# Patient Record
Sex: Male | Born: 1935 | Race: White | Hispanic: No | Marital: Married | Smoking: Former smoker
Health system: Southern US, Community
[De-identification: ages and names within clinical notes are randomized; demographics above are authoritative.]

## PROBLEM LIST (undated history)

## (undated) DIAGNOSIS — R740 Nonspecific elevation of levels of transaminase and lactic acid dehydrogenase [LDH]: Secondary | ICD-10-CM

## (undated) DIAGNOSIS — K8033 Calculus of bile duct with acute cholangitis with obstruction: Secondary | ICD-10-CM

## (undated) DIAGNOSIS — J189 Pneumonia, unspecified organism: Secondary | ICD-10-CM

## (undated) DIAGNOSIS — K315 Obstruction of duodenum: Secondary | ICD-10-CM

## (undated) DIAGNOSIS — K279 Peptic ulcer, site unspecified, unspecified as acute or chronic, without hemorrhage or perforation: Secondary | ICD-10-CM

## (undated) DIAGNOSIS — K838 Other specified diseases of biliary tract: Secondary | ICD-10-CM

## (undated) DIAGNOSIS — R17 Unspecified jaundice: Secondary | ICD-10-CM

## (undated) DIAGNOSIS — R911 Solitary pulmonary nodule: Secondary | ICD-10-CM

## (undated) DIAGNOSIS — G629 Polyneuropathy, unspecified: Secondary | ICD-10-CM

## (undated) DIAGNOSIS — K8071 Calculus of gallbladder and bile duct without cholecystitis with obstruction: Secondary | ICD-10-CM

## (undated) DIAGNOSIS — K8050 Calculus of bile duct without cholangitis or cholecystitis without obstruction: Secondary | ICD-10-CM

## (undated) DIAGNOSIS — K8020 Calculus of gallbladder without cholecystitis without obstruction: Secondary | ICD-10-CM

## (undated) DIAGNOSIS — E43 Unspecified severe protein-calorie malnutrition: Secondary | ICD-10-CM

## (undated) DIAGNOSIS — R509 Fever, unspecified: Secondary | ICD-10-CM

## (undated) DIAGNOSIS — M1990 Unspecified osteoarthritis, unspecified site: Secondary | ICD-10-CM

## (undated) DIAGNOSIS — J45909 Unspecified asthma, uncomplicated: Secondary | ICD-10-CM

## (undated) HISTORY — DX: Unspecified severe protein-calorie malnutrition: E43

## (undated) HISTORY — DX: Calculus of gallbladder without cholecystitis without obstruction: K80.20

## (undated) HISTORY — DX: Fever, unspecified: R50.9

## (undated) HISTORY — DX: Obstruction of duodenum: K31.5

## (undated) HISTORY — DX: Calculus of bile duct with acute cholangitis with obstruction: K80.33

## (undated) HISTORY — DX: Unspecified jaundice: R17

## (undated) HISTORY — DX: Calculus of bile duct without cholangitis or cholecystitis without obstruction: K80.50

## (undated) HISTORY — DX: Other specified diseases of biliary tract: K83.8

## (undated) HISTORY — DX: Calculus of gallbladder and bile duct without cholecystitis with obstruction: K80.71

## (undated) HISTORY — DX: Nonspecific elevation of levels of transaminase and lactic acid dehydrogenase (ldh): R74.0

## (undated) HISTORY — DX: Pneumonia, unspecified organism: J18.9

## (undated) HISTORY — PX: CERVICAL SPINE SURGERY: SHX589

---

## 2015-09-25 ENCOUNTER — Encounter: Payer: Self-pay | Admitting: *Deleted

## 2015-09-25 ENCOUNTER — Emergency Department: Payer: Medicare Other

## 2015-09-25 ENCOUNTER — Inpatient Hospital Stay
Admission: EM | Admit: 2015-09-25 | Discharge: 2015-09-26 | DRG: 193 | Disposition: A | Discharge status: Home / Self Care | Payer: Medicare Other | Attending: Internal Medicine | Admitting: Internal Medicine

## 2015-09-25 DIAGNOSIS — Z87891 Personal history of nicotine dependence: Secondary | ICD-10-CM

## 2015-09-25 DIAGNOSIS — G8929 Other chronic pain: Secondary | ICD-10-CM | POA: Diagnosis present

## 2015-09-25 DIAGNOSIS — M1990 Unspecified osteoarthritis, unspecified site: Secondary | ICD-10-CM | POA: Diagnosis present

## 2015-09-25 DIAGNOSIS — Z6820 Body mass index (BMI) 20.0-20.9, adult: Secondary | ICD-10-CM

## 2015-09-25 DIAGNOSIS — G629 Polyneuropathy, unspecified: Secondary | ICD-10-CM | POA: Diagnosis present

## 2015-09-25 DIAGNOSIS — J45909 Unspecified asthma, uncomplicated: Secondary | ICD-10-CM | POA: Diagnosis present

## 2015-09-25 DIAGNOSIS — E43 Unspecified severe protein-calorie malnutrition: Secondary | ICD-10-CM | POA: Diagnosis present

## 2015-09-25 DIAGNOSIS — Z8711 Personal history of peptic ulcer disease: Secondary | ICD-10-CM

## 2015-09-25 DIAGNOSIS — J189 Pneumonia, unspecified organism: Principal | ICD-10-CM | POA: Diagnosis present

## 2015-09-25 DIAGNOSIS — Z8261 Family history of arthritis: Secondary | ICD-10-CM

## 2015-09-25 DIAGNOSIS — I959 Hypotension, unspecified: Secondary | ICD-10-CM | POA: Diagnosis present

## 2015-09-25 DIAGNOSIS — Z9889 Other specified postprocedural states: Secondary | ICD-10-CM

## 2015-09-25 HISTORY — DX: Peptic ulcer, site unspecified, unspecified as acute or chronic, without hemorrhage or perforation: K27.9

## 2015-09-25 HISTORY — DX: Unspecified osteoarthritis, unspecified site: M19.90

## 2015-09-25 HISTORY — DX: Polyneuropathy, unspecified: G62.9

## 2015-09-25 HISTORY — DX: Solitary pulmonary nodule: R91.1

## 2015-09-25 HISTORY — DX: Unspecified asthma, uncomplicated: J45.909

## 2015-09-25 LAB — CBC WITH DIFFERENTIAL/PLATELET
BASOS PCT: 0 %
Basophils Absolute: 0 10*3/uL (ref 0–0.1)
Eosinophils Absolute: 0.1 10*3/uL (ref 0–0.7)
Eosinophils Relative: 1 %
HEMATOCRIT: 35.9 % — AB (ref 40.0–52.0)
HEMOGLOBIN: 11.6 g/dL — AB (ref 13.0–18.0)
LYMPHS ABS: 0.3 10*3/uL — AB (ref 1.0–3.6)
LYMPHS PCT: 2 %
MCH: 28.4 pg (ref 26.0–34.0)
MCHC: 32.4 g/dL (ref 32.0–36.0)
MCV: 87.6 fL (ref 80.0–100.0)
MONO ABS: 0.9 10*3/uL (ref 0.2–1.0)
MONOS PCT: 7 %
NEUTROS ABS: 12 10*3/uL — AB (ref 1.4–6.5)
NEUTROS PCT: 90 %
Platelets: 736 10*3/uL — ABNORMAL HIGH (ref 150–440)
RBC: 4.1 MIL/uL — ABNORMAL LOW (ref 4.40–5.90)
RDW: 17 % — AB (ref 11.5–14.5)
WBC: 13.3 10*3/uL — ABNORMAL HIGH (ref 3.8–10.6)

## 2015-09-25 LAB — COMPREHENSIVE METABOLIC PANEL
ALBUMIN: 3.8 g/dL (ref 3.5–5.0)
ALK PHOS: 66 U/L (ref 38–126)
ALT: 14 U/L — ABNORMAL LOW (ref 17–63)
ANION GAP: 6 (ref 5–15)
AST: 25 U/L (ref 15–41)
BUN: 20 mg/dL (ref 6–20)
CALCIUM: 8.7 mg/dL — AB (ref 8.9–10.3)
CHLORIDE: 103 mmol/L (ref 101–111)
CO2: 30 mmol/L (ref 22–32)
Creatinine, Ser: 0.88 mg/dL (ref 0.61–1.24)
GFR calc Af Amer: >60 mL/min (ref >60)
GFR calc non Af Amer: >60 mL/min (ref >60)
GLUCOSE: 141 mg/dL — AB (ref 65–99)
Potassium: 3.9 mmol/L (ref 3.5–5.1)
SODIUM: 139 mmol/L (ref 135–145)
Total Bilirubin: 0.5 mg/dL (ref 0.3–1.2)
Total Protein: 6.9 g/dL (ref 6.5–8.1)

## 2015-09-25 LAB — RAPID INFLUENZA A&B ANTIGENS
Influenza A (ARMC): NEGATIVE
Influenza B (ARMC): NEGATIVE

## 2015-09-25 MED ORDER — DEXTROSE 5 % IV SOLN
500.0000 mg | Freq: Once | INTRAVENOUS | Status: AC
  Administered 2015-09-25: 500 mg via INTRAVENOUS
  Filled 2015-09-25: qty 500

## 2015-09-25 MED ORDER — ACETAMINOPHEN 325 MG PO TABS
650.0000 mg | ORAL_TABLET | Freq: Once | ORAL | Status: AC
  Administered 2015-09-25: 650 mg via ORAL
  Filled 2015-09-25: qty 2

## 2015-09-25 MED ORDER — DEXTROSE 5 % IV SOLN
1.0000 g | Freq: Once | INTRAVENOUS | Status: AC
  Administered 2015-09-25: 1 g via INTRAVENOUS
  Filled 2015-09-25: qty 10

## 2015-09-25 NOTE — ED Notes (Addendum)
Pt arrived to ED via EMS from home with new onset weakness at the dinner table this evening. Pt with cough and congestion x 4 days. Pt with low grade fever at this time, AAOx3, NAD noted. Pt with congested cough noted, productive.

## 2015-09-25 NOTE — ED Notes (Signed)
Pt ambulated to toilet with 1 assist.

## 2015-09-25 NOTE — ED Notes (Signed)
Pt returned from xray via stretcher.

## 2015-09-25 NOTE — ED Notes (Signed)
Pt transported to xray via stretcher

## 2015-09-25 NOTE — ED Notes (Signed)
Per family members, pt has been stating he is tired all evening and left arm has "tremors" according to family. Pt 02 dropped to 87% RA, placed on [place] 2L, 02 came up to 95%.

## 2015-09-25 NOTE — ED Provider Notes (Signed)
[facility] Emergency Department Provider Note  ____________________________________________  Time seen: Approximately 9:15 PM  I have reviewed the triage vital signs and the nursing notes.   HISTORY  Chief Complaint Cough and Nasal Congestion  HPI Jack Rios is a 80 y.o. male with a history of asthma who presents to the ER after he became very weak and was unable to walk to the dinner table tonight; wife noted he was shaky and couldn't hold his head up well. He has had a cough for the past 3-4 days productive of green sputum. He has had some rhinorrhea. He denies any chest pain or abdominal pain. His back pain is unchanged from baseline chronic back pain. He had been tolerating po.  He has been evaluated by the [facility] for a lesion in his lungs and is scheduled for a CT chest 2/21.   Past Medical History  Diagnosis Date  . Asthma     There are no active problems to display for this patient.   History reviewed. No pertinent past surgical history.  No current outpatient prescriptions on file.  Allergies Review of patient's allergies indicates no active allergies.  History reviewed. No pertinent family history.  Social History Social History  Substance Use Topics  . Smoking status: Never Smoker   . Smokeless tobacco: None  . Alcohol Use: No    Review of Systems Constitutional: see hpi Eyes: No visual changes. ENT:  see hpi Cardiovascular: Denies chest pain. Respiratory:  see hpi Gastrointestinal: No abdominal pain.  No nausea, no vomiting.  No diarrhea.   Musculoskeletal: back pain at baseline  10-point ROS otherwise negative.  ____________________________________________   PHYSICAL EXAM:  VITAL SIGNS: ED Triage Vitals  Enc Vitals Group     BP --      Pulse Rate 09/25/15 2059 97     Resp 09/25/15 2059 16     Temp 09/25/15 2059 99.5 F (37.5 C)     Temp Source 09/25/15 2059 Oral     SpO2 09/25/15 2059 95 %     Weight 09/25/15  2059 140 lb (63.504 kg)     Height 09/25/15 2059  (1.702 m)     Head Cir --      Peak Flow --      Pain Score --      Pain Loc --      Pain Edu? --      Excl. in GC? --    Constitutional: Alert and oriented. Well appearing and in no acute distress. Eyes: Conjunctivae are normal. PERRL. EOMI. Head: Atraumatic. Nose: No congestion/rhinnorhea. Mouth/Throat: Mucous membranes are slightly dry.  Oropharynx non-erythematous. Neck: No stridor.   Cardiovascular: Normal rate, regular rhythm. Grossly normal heart sounds.  Good peripheral circulation. Respiratory: Normal respiratory effort.  No retractions.BS coarse B bases. Gastrointestinal: Soft and nontender. No distention. No abdominal bruits. No CVA tenderness. Musculoskeletal: No lower extremity tenderness nor edema.  No joint effusions. Neurologic:  Normal speech and language. No gross focal neurologic deficits are appreciated. No gait instability. Skin:  Skin is warm, dry and intact. No rash noted. Psychiatric: Mood and affect are normal. Speech and behavior are normal.  ____________________________________________   LABS (all labs ordered are listed, but only abnormal results are displayed)  Labs Reviewed  COMPREHENSIVE METABOLIC PANEL - Abnormal; Notable for the following:    Glucose, Bld 141 (*)    Calcium 8.7 (*)    ALT 14 (*)    All other components within  normal limits  CBC WITH DIFFERENTIAL/PLATELET - Abnormal; Notable for the following:    WBC 13.3 (*)    RBC 4.10 (*)    Hemoglobin 11.6 (*)    HCT 35.9 (*)    RDW 17.0 (*)    Platelets 736 (*)    Neutro Abs 12.0 (*)    Lymphs Abs 0.3 (*)    All other components within normal limits  RAPID INFLUENZA A&B ANTIGENS ([facility] ONLY)  CULTURE, BLOOD (ROUTINE X 2)  CULTURE, BLOOD (ROUTINE X 2)   ____________________________________________  RADIOLOGY CXR-IMPRESSION: 1. Mild patchy airspace opacity in the posterior left lower lobe concerning for bronchopneumonia in the  appropriate clinical setting. 2. Chronic appearing bronchitic change and interstitial prominence. 3. Aortic atherosclerosis. 4. Left glenohumeral joint osteoarthritis.  _____________________________________________   INITIAL IMPRESSION / ASSESSMENT AND PLAN / ED COURSE  Pertinent labs & imaging results that were available during my care of the patient were reviewed by me and considered in my medical decision making (see chart for details).  Patient with H and P as well as chest x-ray and lab results consistent with pneumonia. Patient with desaturation to 87% upon standing. Will admit for IV antibiotics and oxygen.  Patient is a veteran but they think they have coverage for care anywhere and would prefer to stay here. ____________________________________________   FINAL CLINICAL IMPRESSION(S) / ED DIAGNOSES  Final diagnoses:  Community acquired pneumonia     Maurilio Lovely, MD 09/25/15 2241

## 2015-09-26 ENCOUNTER — Encounter: Payer: Self-pay | Admitting: Internal Medicine

## 2015-09-26 DIAGNOSIS — E43 Unspecified severe protein-calorie malnutrition: Secondary | ICD-10-CM | POA: Diagnosis present

## 2015-09-26 DIAGNOSIS — G8929 Other chronic pain: Secondary | ICD-10-CM | POA: Diagnosis present

## 2015-09-26 DIAGNOSIS — Z87891 Personal history of nicotine dependence: Secondary | ICD-10-CM | POA: Diagnosis not present

## 2015-09-26 DIAGNOSIS — G629 Polyneuropathy, unspecified: Secondary | ICD-10-CM | POA: Diagnosis present

## 2015-09-26 DIAGNOSIS — M1990 Unspecified osteoarthritis, unspecified site: Secondary | ICD-10-CM | POA: Diagnosis present

## 2015-09-26 DIAGNOSIS — J189 Pneumonia, unspecified organism: Secondary | ICD-10-CM | POA: Diagnosis present

## 2015-09-26 DIAGNOSIS — Z6820 Body mass index (BMI) 20.0-20.9, adult: Secondary | ICD-10-CM | POA: Diagnosis not present

## 2015-09-26 DIAGNOSIS — J45909 Unspecified asthma, uncomplicated: Secondary | ICD-10-CM | POA: Diagnosis present

## 2015-09-26 DIAGNOSIS — I959 Hypotension, unspecified: Secondary | ICD-10-CM | POA: Diagnosis present

## 2015-09-26 DIAGNOSIS — Z8261 Family history of arthritis: Secondary | ICD-10-CM | POA: Diagnosis not present

## 2015-09-26 DIAGNOSIS — Z8711 Personal history of peptic ulcer disease: Secondary | ICD-10-CM | POA: Diagnosis not present

## 2015-09-26 DIAGNOSIS — Z9889 Other specified postprocedural states: Secondary | ICD-10-CM | POA: Diagnosis not present

## 2015-09-26 HISTORY — DX: Unspecified severe protein-calorie malnutrition: E43

## 2015-09-26 HISTORY — DX: Pneumonia, unspecified organism: J18.9

## 2015-09-26 LAB — CBC WITH DIFFERENTIAL/PLATELET
BASOS ABS: 0.1 10*3/uL (ref 0–0.1)
BASOS PCT: 0 %
EOS PCT: 0 %
Eosinophils Absolute: 0 10*3/uL (ref 0–0.7)
HEMATOCRIT: 32.1 % — AB (ref 40.0–52.0)
Hemoglobin: 10.5 g/dL — ABNORMAL LOW (ref 13.0–18.0)
Lymphocytes Relative: 3 %
Lymphs Abs: 0.3 10*3/uL — ABNORMAL LOW (ref 1.0–3.6)
MCH: 28.5 pg (ref 26.0–34.0)
MCHC: 32.8 g/dL (ref 32.0–36.0)
MCV: 86.9 fL (ref 80.0–100.0)
MONO ABS: 0.7 10*3/uL (ref 0.2–1.0)
Monocytes Relative: 6 %
NEUTROS ABS: 10.4 10*3/uL — AB (ref 1.4–6.5)
Neutrophils Relative %: 91 %
PLATELETS: 626 10*3/uL — AB (ref 150–440)
RBC: 3.69 MIL/uL — ABNORMAL LOW (ref 4.40–5.90)
RDW: 16.7 % — AB (ref 11.5–14.5)
WBC: 11.5 10*3/uL — ABNORMAL HIGH (ref 3.8–10.6)

## 2015-09-26 LAB — BASIC METABOLIC PANEL
ANION GAP: 3 — AB (ref 5–15)
BUN: 16 mg/dL (ref 6–20)
CHLORIDE: 108 mmol/L (ref 101–111)
CO2: 28 mmol/L (ref 22–32)
CREATININE: 0.85 mg/dL (ref 0.61–1.24)
Calcium: 7.7 mg/dL — ABNORMAL LOW (ref 8.9–10.3)
GFR calc non Af Amer: >60 mL/min (ref >60)
Glucose, Bld: 100 mg/dL — ABNORMAL HIGH (ref 65–99)
POTASSIUM: 3.9 mmol/L (ref 3.5–5.1)
SODIUM: 139 mmol/L (ref 135–145)

## 2015-09-26 LAB — COMPREHENSIVE METABOLIC PANEL
ALBUMIN: 3.3 g/dL — AB (ref 3.5–5.0)
ALK PHOS: 54 U/L (ref 38–126)
ALT: 12 U/L — AB (ref 17–63)
ANION GAP: 8 (ref 5–15)
AST: 23 U/L (ref 15–41)
BILIRUBIN TOTAL: 0.5 mg/dL (ref 0.3–1.2)
BUN: 18 mg/dL (ref 6–20)
CALCIUM: 8.2 mg/dL — AB (ref 8.9–10.3)
CO2: 26 mmol/L (ref 22–32)
Chloride: 104 mmol/L (ref 101–111)
Creatinine, Ser: 0.82 mg/dL (ref 0.61–1.24)
GFR calc Af Amer: >60 mL/min (ref >60)
GFR calc non Af Amer: >60 mL/min (ref >60)
GLUCOSE: 107 mg/dL — AB (ref 65–99)
Potassium: 3.7 mmol/L (ref 3.5–5.1)
SODIUM: 138 mmol/L (ref 135–145)
Total Protein: 5.9 g/dL — ABNORMAL LOW (ref 6.5–8.1)

## 2015-09-26 LAB — CBC
HCT: 31 % — ABNORMAL LOW (ref 40.0–52.0)
HEMOGLOBIN: 10.1 g/dL — AB (ref 13.0–18.0)
MCH: 28.7 pg (ref 26.0–34.0)
MCHC: 32.6 g/dL (ref 32.0–36.0)
MCV: 88 fL (ref 80.0–100.0)
PLATELETS: 593 10*3/uL — AB (ref 150–440)
RBC: 3.52 MIL/uL — AB (ref 4.40–5.90)
RDW: 16.7 % — ABNORMAL HIGH (ref 11.5–14.5)
WBC: 12 10*3/uL — AB (ref 3.8–10.6)

## 2015-09-26 LAB — PROTIME-INR
INR: 1.48
Prothrombin Time: 18 seconds — ABNORMAL HIGH (ref 11.4–15.0)

## 2015-09-26 LAB — PROCALCITONIN: PROCALCITONIN: 0.23 ng/mL

## 2015-09-26 LAB — LACTIC ACID, PLASMA
Lactic Acid, Venous: 1.3 mmol/L (ref 0.5–2.0)
Lactic Acid, Venous: 1.2 mmol/L (ref 0.5–2.0)

## 2015-09-26 LAB — APTT: APTT: 48 s — AB (ref 24–36)

## 2015-09-26 MED ORDER — SODIUM CHLORIDE 0.9% FLUSH
3.0000 mL | Freq: Two times a day (BID) | INTRAVENOUS | Status: DC
  Administered 2015-09-26: 3 mL via INTRAVENOUS

## 2015-09-26 MED ORDER — ACETAMINOPHEN 325 MG PO TABS: 650.0000 mg | ORAL_TABLET | Freq: Four times a day (QID) | ORAL | Status: DC | PRN

## 2015-09-26 MED ORDER — SODIUM CHLORIDE 0.9 % IV SOLN
INTRAVENOUS | Status: DC
Start: 2015-09-26 — End: 2015-09-26

## 2015-09-26 MED ORDER — ACETAMINOPHEN 650 MG RE SUPP: 650.0000 mg | Freq: Four times a day (QID) | RECTAL | Status: DC | PRN

## 2015-09-26 MED ORDER — INFLUENZA VAC SPLIT QUAD 0.5 ML IM SUSY: 0.5000 mL | PREFILLED_SYRINGE | INTRAMUSCULAR | Status: DC

## 2015-09-26 MED ORDER — LEVOFLOXACIN 500 MG PO TABS: 500.0000 mg | ORAL_TABLET | Freq: Every day | ORAL | Status: DC

## 2015-09-26 MED ORDER — DEXTROSE 5 % IV SOLN
500.0000 mg | INTRAVENOUS | Status: DC
  Filled 2015-09-26: qty 500

## 2015-09-26 MED ORDER — SODIUM CHLORIDE 0.9 % IV BOLUS (SEPSIS)
1000.0000 mL | INTRAVENOUS | Status: AC
  Administered 2015-09-26 (×2): 1000 mL via INTRAVENOUS

## 2015-09-26 MED ORDER — ENSURE ENLIVE PO LIQD
237.0000 mL | Freq: Two times a day (BID) | ORAL | Status: DC
  Administered 2015-09-26: 237 mL via ORAL

## 2015-09-26 MED ORDER — CEFTRIAXONE SODIUM 1 G IJ SOLR
1.0000 g | Freq: Once | INTRAMUSCULAR | Status: DC
  Filled 2015-09-26: qty 10

## 2015-09-26 MED ORDER — ALUM & MAG HYDROXIDE-SIMETH 200-200-20 MG/5ML PO SUSP: 30.0000 mL | Freq: Four times a day (QID) | ORAL | Status: DC | PRN

## 2015-09-26 MED ORDER — ONDANSETRON HCL 4 MG/2ML IJ SOLN: 4.0000 mg | Freq: Four times a day (QID) | INTRAMUSCULAR | Status: DC | PRN

## 2015-09-26 MED ORDER — GUAIFENESIN-DM 100-10 MG/5ML PO SYRP: 5.0000 mL | ORAL_SOLUTION | ORAL | Status: DC | PRN

## 2015-09-26 MED ORDER — DEXTROSE 5 % IV SOLN
1.0000 g | INTRAVENOUS | Status: DC
  Filled 2015-09-26: qty 10

## 2015-09-26 MED ORDER — AZITHROMYCIN 500 MG IV SOLR
500.0000 mg | Freq: Once | INTRAVENOUS | Status: DC
  Filled 2015-09-26: qty 500

## 2015-09-26 MED ORDER — HYDROCODONE-ACETAMINOPHEN 5-325 MG PO TABS: 1.0000 | ORAL_TABLET | ORAL | Status: DC | PRN

## 2015-09-26 MED ORDER — SENNOSIDES-DOCUSATE SODIUM 8.6-50 MG PO TABS: 1.0000 | ORAL_TABLET | Freq: Every evening | ORAL | Status: DC | PRN

## 2015-09-26 MED ORDER — ONDANSETRON HCL 4 MG PO TABS: 4.0000 mg | ORAL_TABLET | Freq: Four times a day (QID) | ORAL | Status: DC | PRN

## 2015-09-26 MED ORDER — ENOXAPARIN SODIUM 40 MG/0.4ML ~~LOC~~ SOLN
40.0000 mg | Freq: Every day | SUBCUTANEOUS | Status: DC
  Administered 2015-09-26: 40 mg via SUBCUTANEOUS
  Filled 2015-09-26: qty 0.4

## 2015-09-26 MED ORDER — ALBUTEROL SULFATE (2.5 MG/3ML) 0.083% IN NEBU
2.5000 mg | INHALATION_SOLUTION | Freq: Four times a day (QID) | RESPIRATORY_TRACT | Status: DC
  Administered 2015-09-26 (×2): 2.5 mg via RESPIRATORY_TRACT
  Filled 2015-09-26 (×2): qty 3

## 2015-09-26 NOTE — Progress Notes (Signed)
Initial Nutrition Assessment  DOCUMENTATION CODES:   Severe malnutrition in context of chronic illness  INTERVENTION:  Meals and snacks: Cater to pt preferences Medical Nutrition Supplement Therapy: Recommend Ensure Enlive po BID, each supplement provides 350 kcal and 20 grams of protein. Pt also likes boost and encouraged wife to bring in if pt wants Nutrition diet education: Discussed higher kcal, protein foods and food strategies for eating at evening meal with pt and wife. Verbalized understanding and expect fair compliance    NUTRITION DIAGNOSIS:   Inadequate oral intake related to poor appetite as evidenced by per patient/family report, severe depletion of body fat, severe depletion of muscle mass.     GOAL:   Patient will meet greater than or equal to 90% of their needs     MONITOR:    (Energy intake)  REASON FOR ASSESSMENT:   Malnutrition Screening Tool    ASSESSMENT:      Pt admitted with pneumonia, SIRS.  Wife reports pt with lung mass noted at the [place] recently  Past Medical History  Diagnosis Date  . Asthma   . Peptic ulcer   . Lung nodule   . Neuropathy (HCC)   . Arthritis     Current Nutrition: ate cereal for breakfast this am  Food/Nutrition-Related History: reports appetite has been down for "years" Typically eats 1 good meal per day (meat and vegetables around noon) the rest of the day eats cereal mostly at night.  Reports at night foods don't agree with me (peptic ulcer per wife)   Scheduled Medications:  . albuterol  2.5 mg Nebulization Q6H  . azithromycin  500 mg Intravenous Q24H  . cefTRIAXone (ROCEPHIN)  IV  1 g Intravenous Q24H  . enoxaparin (LOVENOX) injection  40 mg Subcutaneous QHS  . [START ON 09/27/2015] Influenza vac split quadrivalent PF  0.5 mL Intramuscular Tomorrow-1000  . sodium chloride flush  3 mL Intravenous Q12H    Continuous Medications:  . sodium chloride 75 mL/hr at 09/26/15 0402     Electrolyte/Renal Profile  and Glucose Profile:   Recent Labs Lab 09/25/15 2100 09/26/15 0207 09/26/15 0518  NA 139 138 139  K 3.9 3.7 3.9  CL 103 104 108  CO2 BUN CREATININE 0.88 0.82 0.85  CALCIUM 8.7* 8.2* 7.7*  GLUCOSE 141* 107* 100*   Protein Profile:  Recent Labs Lab 09/25/15 2100 09/26/15 0207  ALBUMIN 3.8 3.3*    Gastrointestinal Profile: Last BM: unknown   Nutrition-Focused Physical Exam Findings: Nutrition-Focused physical exam completed. Findings are moderate-severe fat depletion, moderate-severe muscle depletion, and no edema.      Weight Change: pt reports change in pant size from 36 waist to 31 waist in the last 5 years.  No further information given    Diet Order:  Diet regular Room service appropriate?: Yes; Fluid consistency:: Thin  Skin:   reviewed  Height:   Ht Readings from Last 1 Encounters:  09/26/15  (1.676 m)    Weight:   Wt Readings from Last 1 Encounters:  09/26/15 126 lb 14.4 oz (57.561 kg)    Ideal Body Weight:     BMI:  Body mass index is 20.49 kg/(m^2).  Estimated Nutritional Needs:   Kcal:  BEE 1071 kcals (IF 1.1-1.3, AF 1.3) 0981-1914 kcals/d  Protein:  (1.1-1.3 g/kg) 63-74 g/d  Fluid:  (30-24ml/kg) 1710-1913ml/d  EDUCATION NEEDS:   Education needs addressed  HIGH Care Level  Mckinleigh Schuchart B. Freida Busman, RD, LDN [phone] (  pager) Weekend/On-Call pager [phone])

## 2015-09-26 NOTE — H&P (Signed)
[facility] [place] at [facility]   PATIENT NAME: Jack Rios    MR#:  [id number]  DATE OF BIRTH:  1935/09/26  DATE OF ADMISSION:  09/25/2015  PRIMARY CARE PHYSICIAN: No primary care provider on file.   REQUESTING/REFERRING PHYSICIAN:   CHIEF COMPLAINT:   Chief Complaint  Patient presents with  . Cough  . Nasal Congestion    HISTORY OF PRESENT ILLNESS: Jack Rios  is a 80 y.o. male with a known history of bronchial asthma, neuropathy, arthritis, peptic ulcer presented to the emergency room with cough for 3 days duration. Patient off late has been doing yard work everyday last few weeks. Has cough and low-grade fever for 3 days. Cough is productive of yellowish phlegm. Patient has a body aches and generalized weakness. No history of any vomiting, nausea and abdominal pain. Patient was given IV fluids and antibiotics in the emergency room. During the workup patient was found to have elevated WBC count and pneumonia on chest x-ray. He was hypotensive and received IV fluids in the emergency room. No history of fall. No history of any chest pain. No history of any dizziness or blurry vision. Patient is active and independent in activities of daily living.  PAST MEDICAL HISTORY:   Past Medical History  Diagnosis Date  . Asthma   . Peptic ulcer   . Lung nodule   . Neuropathy (HCC)   . Arthritis     PAST SURGICAL HISTORY: Past Surgical History  Procedure Laterality Date  . Cervical spine surgery      SOCIAL HISTORY:  Social History  Substance Use Topics  . Smoking status: Former Games developer  . Smokeless tobacco: Not on file  . Alcohol Use: No    FAMILY HISTORY:  Family History  Problem Relation Age of Onset  . Arthritis Brother     DRUG ALLERGIES:  Allergies  Allergen Reactions  . Molds & Smuts     REVIEW OF SYSTEMS:   CONSTITUTIONAL: Has fever, has fatigue and weakness.  EYES: No blurred or double vision.  EARS, NOSE, AND THROAT: No  tinnitus or ear pain.  RESPIRATORY: has cough,has shortness of breath, has rales. CARDIOVASCULAR: No chest pain, orthopnea, edema.  GASTROINTESTINAL: No nausea, vomiting, diarrhea or abdominal pain.  GENITOURINARY: No dysuria, hematuria.  ENDOCRINE: No polyuria, nocturia.  HEMATOLOGY: No anemia, easy bruising or bleeding SKIN: No rash or lesion. MUSCULOSKELETAL: has arthritis.   NEUROLOGIC: No tingling, numbness, weakness.  PSYCHIATRY: No anxiety or depression.   MEDICATIONS AT HOME:  Prior to Admission medications   Not on File      PHYSICAL EXAMINATION:   VITAL SIGNS: Blood pressure 96/56, pulse 80, temperature 99.5 F (37.5 C), temperature source Oral, resp. rate 19, height  (1.702 m), weight 63.504 kg (140 lb), SpO2 96 %.  GENERAL:  80 y.o.-year-old patient lying in the bed with no acute distress.  EYES: Pupils equal, round, reactive to light and accommodation. No scleral icterus. Extraocular muscles intact.  HEENT: Head atraumatic, normocephalic. Oropharynx and nasopharynx clear.  NECK:  Supple, no jugular venous distention. No thyroid enlargement, no tenderness.  LUNGS: Decreased breath sounds bilaterally, no wheezing, rales in the right lung, scattered rhonchi in both lungs. No use of accessory muscles of respiration.  CARDIOVASCULAR: S1, S2 normal. No murmurs, rubs, or gallops.  ABDOMEN: Soft, nontender, nondistended. Bowel sounds present. No organomegaly or mass.  EXTREMITIES: No pedal edema, cyanosis, or clubbing.  NEUROLOGIC: Cranial nerves II through XII are intact. Muscle  strength 5/5 in all extremities. Sensation intact. Gait normal. PSYCHIATRIC: The patient is alert and oriented x 3.  SKIN: No obvious rash, lesion, or ulcer.   LABORATORY PANEL:   CBC  Recent Labs Lab 09/25/15 2100  WBC 13.3*  HGB 11.6*  HCT 35.9*  PLT 736*  MCV 87.6  MCH 28.4  MCHC 32.4  RDW 17.0*  LYMPHSABS 0.3*  MONOABS 0.9  EOSABS 0.1  BASOSABS 0.0    ------------------------------------------------------------------------------------------------------------------  Chemistries   Recent Labs Lab 09/25/15 2100  NA 139  K 3.9  CL 103  CO2 30  GLUCOSE 141*  BUN 20  CREATININE 0.88  CALCIUM 8.7*  AST 25  ALT 14*  ALKPHOS 66  BILITOT 0.5   ------------------------------------------------------------------------------------------------------------------ estimated creatinine clearance is 63.1 mL/min (by C-G formula based on Cr of 0.88). ------------------------------------------------------------------------------------------------------------------ No results for input(s): TSH, T4TOTAL, T3FREE, THYROIDAB in the last 72 hours.  Invalid input(s): FREET3   Coagulation profile No results for input(s): INR, PROTIME in the last 168 hours. ------------------------------------------------------------------------------------------------------------------- No results for input(s): DDIMER in the last 72 hours. -------------------------------------------------------------------------------------------------------------------  Cardiac Enzymes No results for input(s): CKMB, TROPONINI, MYOGLOBIN in the last 168 hours.  Invalid input(s): CK ------------------------------------------------------------------------------------------------------------------ Invalid input(s): POCBNP  ---------------------------------------------------------------------------------------------------------------  Urinalysis No results found for: COLORURINE, APPEARANCEUR, LABSPEC, PHURINE, GLUCOSEU, HGBUR, BILIRUBINUR, KETONESUR, PROTEINUR, UROBILINOGEN, NITRITE, LEUKOCYTESUR   RADIOLOGY: Dg Chest 2 View  09/25/2015  CLINICAL DATA:  80 year old male with new onset weakness beginning at dinner earlier today EXAM: CHEST  2 VIEW COMPARISON:  None. FINDINGS: Cardiac and mediastinal contours are within normal limits. Trace atherosclerotic calcification in the  transverse aorta. Mild patchy airspace opacity in the post row inferior left lower lobe. Mild bronchitic change and interstitial prominence have a chronic appearance. No pneumothorax or pleural effusion. No acute osseous abnormality. Left glenohumeral joint osteoarthritis. IMPRESSION: 1. Mild patchy airspace opacity in the posterior left lower lobe concerning for bronchopneumonia in the appropriate clinical setting. 2. Chronic appearing bronchitic change and interstitial prominence. 3. Aortic atherosclerosis. 4. Left glenohumeral joint osteoarthritis. Electronically Signed   By: Malachy Moan M.D.   On: 09/25/2015 21:26    EKG: No orders found for this or any previous visit.  IMPRESSION AND PLAN: 80 year old elderly male patient with history of bronchial asthma,, neuropathy,, peptic ulcer presented to the emergency room with fever, cough and general malaise. Admitting diagnosis 1. Community-acquired pneumonia 2. Leukocytosis 3. Hypotension 4. Systemic Inflammatory Response Syndrome Treatment plan Admit patient to medical floor 1. IV fluid hydration based on sepsis protocol 2. Start patient on IV Rocephin and IV Zithromax antibiotics 3. Follow-up WBC count 4. DVT prophylaxis with subcutaneous Lovenox 5. Follow-up culture 6. Supportive care.   All the records are reviewed and case discussed with ED provider. Management plans discussed with the patient, family and they are in agreement.  CODE STATUS:FULL    Code Status Orders        Start     Ordered   09/26/15 0030  Full code   Continuous     09/26/15 0032    Code Status History    Date Active Date Inactive Code Status Order ID Comments User Context   This patient has a current code status but no historical code status.       TOTAL TIME TAKING CARE OF THIS PATIENT: 55 minutes.    Ihor Austin M.D on 09/26/2015 at 12:32 AM  Between 7am to 6pm - Pager - [phone]  After 6pm go to [email or web address] - password [email or web address]  [facility]  Office  [phone]  CC: Primary care physician; No primary care provider on file.

## 2015-09-26 NOTE — ED Notes (Addendum)
Admitting doctor at bedside 

## 2015-09-26 NOTE — Progress Notes (Signed)
Patient discharged to home as ordered. Patient already has a follow up appointment at the [facility] next week. Prescription given to patient as ordered. IV's discontinued to both arms, sites with s/s of infiltration or infection. Patient is alert and oriented, no acute distress noted.

## 2015-09-26 NOTE — Discharge Instructions (Signed)

## 2015-09-26 NOTE — Progress Notes (Signed)
Pharmacy Antibiotic Note  Jack Rios is a 80 y.o. male admitted on 09/25/2015 with pneumonia.  Pharmacy has been consulted for azithromycin/ceftriaxone dosing.  Plan: 1. Ceftriaxone 1 gm IV Q24H 2. Azithromycin 500 mg IV Q24H  Height:  (170.2 cm) Weight: 140 lb (63.504 kg) IBW/kg (Calculated) : 66.1  Temp (24hrs), Avg:98.7 F (37.1 C), Min:98.3 F (36.8 C), Max:99.5 F (37.5 C)   Recent Labs Lab 09/25/15 2100  WBC 13.3*  CREATININE 0.88    Estimated Creatinine Clearance: 63.1 mL/min (by C-G formula based on Cr of 0.88).    Allergies  Allergen Reactions  . Molds & Smuts      Thank you for allowing pharmacy to be a part of this patient's care.  Carola Frost, Pharm.D, BCPS Clinical Pharmacist 09/26/2015 1:22 AM

## 2015-09-27 NOTE — Discharge Summary (Signed)
[facility] -  at [facility]   PATIENT NAME: Jack Rios    MR#:  [id number]  DATE OF BIRTH:  [birth year]  DATE OF ADMISSION:  09/25/2015 ADMITTING PHYSICIAN: Ihor Austin, MD  DATE OF DISCHARGE: 09/26/2015  3:07 PM  PRIMARY CARE PHYSICIAN: No primary care provider on file.    ADMISSION DIAGNOSIS:  Community acquired pneumonia [J18.9]  DISCHARGE DIAGNOSIS:  Active Problems:   Pneumonia   Protein-calorie malnutrition, severe   SECONDARY DIAGNOSIS:   Past Medical History  Diagnosis Date  . Asthma   . Peptic ulcer   . Lung nodule   . Neuropathy (HCC)   . Arthritis      ADMITTING HISTORY  Jack Rios is a 80 y.o. male with a known history of bronchial asthma, neuropathy, arthritis, peptic ulcer presented to the emergency room with cough for 3 days duration. Patient off late has been doing yard work everyday last few weeks. Has cough and low-grade fever for 3 days. Cough is productive of yellowish phlegm. Patient has a body aches and generalized weakness. No history of any vomiting, nausea and abdominal pain. Patient was given IV fluids and antibiotics in the emergency room. During the workup patient was found to have elevated WBC count and pneumonia on chest x-ray. He was hypotensive and received IV fluids in the emergency room. No history of fall. No history of any chest pain. No history of any dizziness or blurry vision. Patient is active and independent in activities of daily living.   HOSPITAL COURSE:   Admitted to medical floor for pneumonia. Improved quickly on IV abx. Not needing O2, Afebrile. Walked around [employer] station and felt back to baseline. Discharged home on PO abx and PCP f/u   CONSULTS OBTAINED:     DRUG ALLERGIES:   Allergies  Allergen Reactions  . Molds & Smuts     DISCHARGE MEDICATIONS:   Discharge Medication List as of 09/26/2015  2:36 PM    START taking these medications   Details  levofloxacin  (LEVAQUIN) 500 MG tablet Take 1 tablet (500 mg total) by mouth daily., Starting 09/26/2015, Until Discontinued, Print      CONTINUE these medications which have NOT CHANGED   Details  aspirin EC 81 MG tablet Take 81 mg by mouth daily., Until Discontinued, Historical Med    calcium carbonate (TUMS - DOSED IN MG ELEMENTAL CALCIUM) 500 MG chewable tablet Chew 1 tablet by mouth 2 (two) times daily. Before meals, Until Discontinued, Historical Med    diphenhydrAMINE (SOMINEX) 25 MG tablet Take 25 mg by mouth 2 (two) times daily., Until Discontinued, Historical Med    docusate sodium (COLACE) 100 MG capsule Take 100 mg by mouth 3 (three) times daily., Until Discontinued, Historical Med    ferrous sulfate 325 (65 FE) MG tablet Take 325 mg by mouth daily with breakfast., Until Discontinued, Historical Med    fluticasone (FLONASE) 50 MCG/ACT nasal spray Place 1 spray into both nostrils 2 (two) times daily., Until Discontinued, Historical Med    gabapentin (NEURONTIN) 300 MG capsule Take 600 mg by mouth 2 (two) times daily. For nerve irratation, Until Discontinued, Historical Med    hydroxypropyl methylcellulose / hypromellose (ISOPTO TEARS / GONIOVISC) 2.5 % ophthalmic solution 1 drop., Until Discontinued, Historical Med    ipratropium (ATROVENT) 0.03 % nasal spray Place 2 sprays into both nostrils every 12 (twelve) hours., Until Discontinued, Historical Med    pantoprazole (PROTONIX) 40 MG tablet Take 40 mg by mouth 2 (two)  times daily., Until Discontinued, Historical Med    terazosin (HYTRIN) 10 MG capsule Take 10 mg by mouth at bedtime., Until Discontinued, Historical Med    traMADol (ULTRAM) 50 MG tablet Take 50 mg by mouth 2 (two) times daily. As needed for pain, Until Discontinued, Historical Med         Today    VITAL SIGNS:  Blood pressure 110/59, pulse 77, temperature 98.1 F (36.7 C), temperature source Oral, resp. rate 18, height  (1.676 m), weight 57.561 kg (126 lb 14.4  oz), SpO2 96 %.  I/O:  No intake or output data in the 24 hours ending 09/27/15 1740  PHYSICAL EXAMINATION:  Physical Exam  GENERAL:  80 y.o.-year-old patient lying in the bed with no acute distress.  LUNGS: Normal breath sounds bilaterally, no wheezing, rales,rhonchi or crepitation. No use of accessory muscles of respiration.  CARDIOVASCULAR: S1, S2 normal. No murmurs, rubs, or gallops.  ABDOMEN: Soft, non-tender, non-distended. Bowel sounds present. No organomegaly or mass.  NEUROLOGIC: Moves all 4 extremities. PSYCHIATRIC: The patient is alert and oriented x 3.  SKIN: No obvious rash, lesion, or ulcer.   DATA REVIEW:   CBC  Recent Labs Lab 09/26/15 0518  WBC 12.0*  HGB 10.1*  HCT 31.0*  PLT 593*    Chemistries   Recent Labs Lab 09/26/15 0207 09/26/15 0518  NA 138 139  K 3.7 3.9  CL 104 108  CO2 26 28  GLUCOSE 107* 100*  BUN 18 16  CREATININE 0.82 0.85  CALCIUM 8.2* 7.7*  AST 23  --   ALT 12*  --   ALKPHOS 54  --   BILITOT 0.5  --     Cardiac Enzymes No results for input(s): TROPONINI in the last 168 hours.  Microbiology Results  Results for orders placed or performed during the hospital encounter of 09/25/15  Rapid Influenza A&B Antigens ([facility] only)     Status: None   Collection Time: 09/25/15  9:32 PM  Result Value Ref Range Status   Influenza A ([facility]) NEGATIVE  Final   Influenza B ([facility]) NEGATIVE  Final  Culture, blood (routine x 2)     Status: None (Preliminary result)   Collection Time: 09/25/15 11:01 PM  Result Value Ref Range Status   Specimen Description BLOOD BLOOD LEFT FOREARM  Final   Special Requests BOTTLES DRAWN AEROBIC AND ANAEROBIC 13CC  Final   Culture NO GROWTH < 12 HOURS  Final   Report Status PENDING  Incomplete  Culture, blood (routine x 2)     Status: None (Preliminary result)   Collection Time: 09/25/15 11:01 PM  Result Value Ref Range Status   Specimen Description BLOOD BLOOD RIGHT FOREARM  Final   Special Requests  BOTTLES DRAWN AEROBIC AND ANAEROBIC 13CC  Final   Culture NO GROWTH < 12 HOURS  Final   Report Status PENDING  Incomplete  Culture, blood (x 2)     Status: None (Preliminary result)   Collection Time: 09/26/15  1:56 AM  Result Value Ref Range Status   Specimen Description BLOOD RIGHTAC  Final   Special Requests NONE  Final   Culture NO GROWTH < 12 HOURS  Final   Report Status PENDING  Incomplete    RADIOLOGY:  Dg Chest 2 View  09/25/2015  CLINICAL DATA:  80 year old male with new onset weakness beginning at dinner earlier today EXAM: CHEST  2 VIEW COMPARISON:  None. FINDINGS: Cardiac and mediastinal contours are within normal limits. Trace atherosclerotic  calcification in the transverse aorta. Mild patchy airspace opacity in the post row inferior left lower lobe. Mild bronchitic change and interstitial prominence have a chronic appearance. No pneumothorax or pleural effusion. No acute osseous abnormality. Left glenohumeral joint osteoarthritis. IMPRESSION: 1. Mild patchy airspace opacity in the posterior left lower lobe concerning for bronchopneumonia in the appropriate clinical setting. 2. Chronic appearing bronchitic change and interstitial prominence. 3. Aortic atherosclerosis. 4. Left glenohumeral joint osteoarthritis. Electronically Signed   By: Malachy Moan M.D.   On: 09/25/2015 21:26      Follow up with PCP in 1 week.  Management plans discussed with the patient, family and they are in agreement.  CODE STATUS:  Code Status History    Date Active Date Inactive Code Status Order ID Comments User Context   09/26/2015 12:32 AM 09/26/2015  6:08 PM Full Code [id number]  Ihor Austin, MD ED      TOTAL TIME TAKING CARE OF THIS PATIENT ON DAY OF DISCHARGE: more than 30 minutes.    Milagros Loll R M.D on 09/27/2015 at 5:40 PM  Between 7am to 6pm - Pager - [phone]  After 6pm go to [email or web address] - password [email or web address] [facility]  [place] [facility]  Office   [phone]  CC: Primary care physician; No primary care provider on file.     Note: This dictation was prepared with Dragon dictation along with smaller phrase technology. Any transcriptional errors that result from this process are unintentional.

## 2015-09-28 LAB — BLOOD CULTURE ID PANEL (REFLEXED)
ACINETOBACTER BAUMANNII: NOT DETECTED
CANDIDA ALBICANS: NOT DETECTED
CANDIDA PARAPSILOSIS: NOT DETECTED
Candida glabrata: NOT DETECTED
Candida krusei: NOT DETECTED
Candida tropicalis: NOT DETECTED
Carbapenem resistance: NOT DETECTED
ENTEROBACTER CLOACAE COMPLEX: NOT DETECTED
ENTEROBACTERIACEAE SPECIES: NOT DETECTED
Enterococcus species: NOT DETECTED
Escherichia coli: NOT DETECTED
Haemophilus influenzae: NOT DETECTED
KLEBSIELLA PNEUMONIAE: NOT DETECTED
Klebsiella oxytoca: NOT DETECTED
LISTERIA MONOCYTOGENES: NOT DETECTED
METHICILLIN RESISTANCE: NOT DETECTED
NEISSERIA MENINGITIDIS: NOT DETECTED
PSEUDOMONAS AERUGINOSA: NOT DETECTED
Proteus species: NOT DETECTED
STAPHYLOCOCCUS SPECIES: NOT DETECTED
STREPTOCOCCUS SPECIES: NOT DETECTED
Serratia marcescens: NOT DETECTED
Staphylococcus aureus (BCID): NOT DETECTED
Streptococcus agalactiae: NOT DETECTED
Streptococcus pneumoniae: NOT DETECTED
Streptococcus pyogenes: NOT DETECTED
VANCOMYCIN RESISTANCE: NOT DETECTED

## 2015-09-30 LAB — CULTURE, BLOOD (ROUTINE X 2): CULTURE: NO GROWTH

## 2015-10-01 LAB — CULTURE, BLOOD (ROUTINE X 2): CULTURE: NO GROWTH

## 2015-10-02 LAB — CULTURE, BLOOD (ROUTINE X 2)

## 2017-04-30 ENCOUNTER — Inpatient Hospital Stay
Admission: EM | Admit: 2017-04-30 | Discharge: 2017-05-05 | DRG: 408 | Disposition: A | Discharge status: Home / Self Care | Payer: Medicare Other | Attending: Internal Medicine | Admitting: Internal Medicine

## 2017-04-30 ENCOUNTER — Emergency Department: Payer: Medicare Other

## 2017-04-30 ENCOUNTER — Encounter: Payer: Self-pay | Admitting: Emergency Medicine

## 2017-04-30 DIAGNOSIS — R945 Abnormal results of liver function studies: Secondary | ICD-10-CM

## 2017-04-30 DIAGNOSIS — R740 Nonspecific elevation of levels of transaminase and lactic acid dehydrogenase [LDH]: Secondary | ICD-10-CM

## 2017-04-30 DIAGNOSIS — K8031 Calculus of bile duct with cholangitis, unspecified, with obstruction: Secondary | ICD-10-CM | POA: Diagnosis present

## 2017-04-30 DIAGNOSIS — R509 Fever, unspecified: Secondary | ICD-10-CM

## 2017-04-30 DIAGNOSIS — Z7982 Long term (current) use of aspirin: Secondary | ICD-10-CM

## 2017-04-30 DIAGNOSIS — K219 Gastro-esophageal reflux disease without esophagitis: Secondary | ICD-10-CM | POA: Diagnosis present

## 2017-04-30 DIAGNOSIS — Z7951 Long term (current) use of inhaled steroids: Secondary | ICD-10-CM

## 2017-04-30 DIAGNOSIS — Z91048 Other nonmedicinal substance allergy status: Secondary | ICD-10-CM

## 2017-04-30 DIAGNOSIS — R410 Disorientation, unspecified: Secondary | ICD-10-CM

## 2017-04-30 DIAGNOSIS — J449 Chronic obstructive pulmonary disease, unspecified: Secondary | ICD-10-CM | POA: Diagnosis present

## 2017-04-30 DIAGNOSIS — Z79899 Other long term (current) drug therapy: Secondary | ICD-10-CM

## 2017-04-30 DIAGNOSIS — Z87891 Personal history of nicotine dependence: Secondary | ICD-10-CM

## 2017-04-30 DIAGNOSIS — M1990 Unspecified osteoarthritis, unspecified site: Secondary | ICD-10-CM | POA: Diagnosis present

## 2017-04-30 DIAGNOSIS — K8020 Calculus of gallbladder without cholecystitis without obstruction: Secondary | ICD-10-CM

## 2017-04-30 DIAGNOSIS — K8033 Calculus of bile duct with acute cholangitis with obstruction: Secondary | ICD-10-CM

## 2017-04-30 DIAGNOSIS — K8050 Calculus of bile duct without cholangitis or cholecystitis without obstruction: Secondary | ICD-10-CM

## 2017-04-30 DIAGNOSIS — N400 Enlarged prostate without lower urinary tract symptoms: Secondary | ICD-10-CM | POA: Diagnosis present

## 2017-04-30 DIAGNOSIS — K8071 Calculus of gallbladder and bile duct without cholecystitis with obstruction: Principal | ICD-10-CM | POA: Diagnosis present

## 2017-04-30 DIAGNOSIS — R17 Unspecified jaundice: Secondary | ICD-10-CM

## 2017-04-30 DIAGNOSIS — R911 Solitary pulmonary nodule: Secondary | ICD-10-CM | POA: Diagnosis present

## 2017-04-30 DIAGNOSIS — A419 Sepsis, unspecified organism: Secondary | ICD-10-CM | POA: Diagnosis present

## 2017-04-30 DIAGNOSIS — Z8711 Personal history of peptic ulcer disease: Secondary | ICD-10-CM

## 2017-04-30 DIAGNOSIS — R7989 Other specified abnormal findings of blood chemistry: Secondary | ICD-10-CM

## 2017-04-30 DIAGNOSIS — K315 Obstruction of duodenum: Secondary | ICD-10-CM | POA: Diagnosis present

## 2017-04-30 DIAGNOSIS — R7401 Elevation of levels of liver transaminase levels: Secondary | ICD-10-CM | POA: Diagnosis present

## 2017-04-30 DIAGNOSIS — K838 Other specified diseases of biliary tract: Secondary | ICD-10-CM

## 2017-04-30 DIAGNOSIS — G629 Polyneuropathy, unspecified: Secondary | ICD-10-CM | POA: Diagnosis present

## 2017-04-30 LAB — COMPREHENSIVE METABOLIC PANEL
ALBUMIN: 3.7 g/dL (ref 3.5–5.0)
ALT: 236 U/L — AB (ref 17–63)
AST: 442 U/L — AB (ref 15–41)
Alkaline Phosphatase: 110 U/L (ref 38–126)
Anion gap: 5 (ref 5–15)
BUN: 20 mg/dL (ref 6–20)
CHLORIDE: 104 mmol/L (ref 101–111)
CO2: 26 mmol/L (ref 22–32)
CREATININE: 1.03 mg/dL (ref 0.61–1.24)
Calcium: 8.3 mg/dL — ABNORMAL LOW (ref 8.9–10.3)
GFR calc non Af Amer: >60 mL/min (ref >60)
Glucose, Bld: 139 mg/dL — ABNORMAL HIGH (ref 65–99)
Potassium: 3.8 mmol/L (ref 3.5–5.1)
SODIUM: 135 mmol/L (ref 135–145)
Total Bilirubin: 1.3 mg/dL — ABNORMAL HIGH (ref 0.3–1.2)
Total Protein: 6.4 g/dL — ABNORMAL LOW (ref 6.5–8.1)

## 2017-04-30 LAB — URINALYSIS, COMPLETE (UACMP) WITH MICROSCOPIC
BACTERIA UA: NONE SEEN
Bilirubin Urine: NEGATIVE
Glucose, UA: NEGATIVE mg/dL
Hgb urine dipstick: NEGATIVE
Ketones, ur: NEGATIVE mg/dL
Leukocytes, UA: NEGATIVE
Nitrite: NEGATIVE
PROTEIN: 30 mg/dL — AB
SQUAMOUS EPITHELIAL / LPF: NONE SEEN
Specific Gravity, Urine: 1.018 (ref 1.005–1.030)
pH: 6 (ref 5.0–8.0)

## 2017-04-30 LAB — CBC WITH DIFFERENTIAL/PLATELET
Basophils Absolute: 0.1 10*3/uL (ref 0–0.1)
Basophils Relative: 1 %
EOS ABS: 0 10*3/uL (ref 0–0.7)
Eosinophils Relative: 0 %
HEMATOCRIT: 32.8 % — AB (ref 40.0–52.0)
HEMOGLOBIN: 11.4 g/dL — AB (ref 13.0–18.0)
LYMPHS ABS: 0.1 10*3/uL — AB (ref 1.0–3.6)
Lymphocytes Relative: 1 %
MCH: 37.5 pg — AB (ref 26.0–34.0)
MCHC: 34.7 g/dL (ref 32.0–36.0)
MCV: 108.1 fL — ABNORMAL HIGH (ref 80.0–100.0)
MONOS PCT: 7 %
Monocytes Absolute: 0.6 10*3/uL (ref 0.2–1.0)
NEUTROS PCT: 91 %
Neutro Abs: 7.7 10*3/uL — ABNORMAL HIGH (ref 1.4–6.5)
Platelets: 447 10*3/uL — ABNORMAL HIGH (ref 150–440)
RBC: 3.04 MIL/uL — ABNORMAL LOW (ref 4.40–5.90)
RDW: 15.8 % — ABNORMAL HIGH (ref 11.5–14.5)
WBC: 8.5 10*3/uL (ref 3.8–10.6)

## 2017-04-30 LAB — AMMONIA: AMMONIA: 18 umol/L (ref 9–35)

## 2017-04-30 LAB — TROPONIN I

## 2017-04-30 LAB — LACTIC ACID, PLASMA: LACTIC ACID, VENOUS: 1.2 mmol/L (ref 0.5–1.9)

## 2017-04-30 MED ORDER — IOPAMIDOL (ISOVUE-300) INJECTION 61%
100.0000 mL | Freq: Once | INTRAVENOUS | Status: AC | PRN
  Administered 2017-04-30: 100 mL via INTRAVENOUS

## 2017-04-30 MED ORDER — IOPAMIDOL (ISOVUE-300) INJECTION 61%
30.0000 mL | Freq: Once | INTRAVENOUS | Status: AC
  Administered 2017-04-30: 30 mL via ORAL

## 2017-04-30 MED ORDER — SODIUM CHLORIDE 0.9 % IV BOLUS (SEPSIS)
500.0000 mL | Freq: Once | INTRAVENOUS | Status: AC
  Administered 2017-04-30: 500 mL via INTRAVENOUS

## 2017-04-30 NOTE — ED Notes (Signed)
Patient made aware of need of urine sample. States he does not need to void at this time.

## 2017-04-30 NOTE — ED Notes (Signed)
In and out cath completed by this RN. Assisted by Kristian Covey, RN. Patient tolerated well. Sterile technique maintained. Clear amber urine returned.

## 2017-04-30 NOTE — ED Provider Notes (Signed)
[facility] Emergency Department Provider Note  ____________________________________________   I have reviewed the triage vital signs and the nursing notes.   HISTORY  Chief Complaint Fever    HPI Jack Rios is a 81 y.o. male who presents today after having noticed a fever this afternoon. He was not lethargic or confused but he seemed a little less responsible than normal. He tried to go to the bathroom and did not quite make it, this is unusual for him. Patient is very active, he works 6 days a week Holiday representative. He states he has no dysuria no urinary frequency. He is otherwise largely healthy, history of asthma, neuropathy, has had pneumonia in the past and UTIs. The patient is in his home and usually. He has had no vomiting no abdominal pain no chest pain or shortness of breath or other source of fever identified.    Past Medical History:  Diagnosis Date  . Arthritis   . Asthma   . Lung nodule   . Neuropathy   . Peptic ulcer     Patient Active Problem List   Diagnosis Date Noted  . Pneumonia 09/26/2015  . Protein-calorie malnutrition, severe 09/26/2015    Past Surgical History:  Procedure Laterality Date  . CERVICAL SPINE SURGERY      Prior to Admission medications   Medication Sig Start Date End Date Taking? Authorizing Provider  aspirin EC 81 MG tablet Take 81 mg by mouth daily.    [provider]  calcium carbonate (TUMS - DOSED IN MG ELEMENTAL CALCIUM) 500 MG chewable tablet Chew 1 tablet by mouth 2 (two) times daily. Before meals    [provider]  diphenhydrAMINE (SOMINEX) 25 MG tablet Take 25 mg by mouth 2 (two) times daily.    [provider]  docusate sodium (COLACE) 100 MG capsule Take 100 mg by mouth 3 (three) times daily.    [provider]  ferrous sulfate 325 (65 FE) MG tablet Take 325 mg by mouth daily with breakfast.    [provider]  fluticasone (FLONASE) 50 MCG/ACT nasal  spray Place 1 spray into both nostrils 2 (two) times daily.    [provider]  gabapentin (NEURONTIN) 300 MG capsule Take 600 mg by mouth 2 (two) times daily. For nerve irratation    [provider]  hydroxypropyl methylcellulose / hypromellose (ISOPTO TEARS / GONIOVISC) 2.5 % ophthalmic solution 1 drop.    [provider]  ipratropium (ATROVENT) 0.03 % nasal spray Place 2 sprays into both nostrils every 12 (twelve) hours.    [provider]  levofloxacin (LEVAQUIN) 500 MG tablet Take 1 tablet (500 mg total) by mouth daily. 09/26/15   Milagros Loll, MD  pantoprazole (PROTONIX) 40 MG tablet Take 40 mg by mouth 2 (two) times daily.    [provider]  terazosin (HYTRIN) 10 MG capsule Take 10 mg by mouth at bedtime.    [provider]  traMADol (ULTRAM) 50 MG tablet Take 50 mg by mouth 2 (two) times daily. As needed for pain    [provider]    Allergies Molds & smuts  Family History  Problem Relation Age of Onset  . Arthritis Brother     Social History Social History  Substance Use Topics  . Smoking status: Former Games developer  . Smokeless tobacco: Not on file  . Alcohol use No    Review of Systems Constitutional: Positive fever fever/chills Eyes: No visual changes. ENT: No sore throat.  No stiff neck no neck pain Cardiovascular: Denies chest pain. Respiratory: Denies shortness of breath. Gastrointestinal:   no vomiting.  No diarrhea.  No constipation. Genitourinary: Negative for dysuria. Musculoskeletal: Negative lower extremity swelling Skin: Negative for rash. Neurological: Negative for severe headaches, focal weakness or numbness.   ____________________________________________   PHYSICAL EXAM:  VITAL SIGNS: ED Triage Vitals [04/30/17 1904]  Enc Vitals Group     BP 127/66     Pulse Rate 96     Resp 15     Temp 100 F (37.8 C)     Temp Source Oral     SpO2 95 %     Weight 126 lb (57.2 kg)     Height 5'  8" (1.727 m)     Head Circumference      Peak Flow      Pain Score      Pain Loc      Pain Edu?      Excl. in GC?     Constitutional: Alert and oriented. Well appearing and in no acute distress. Eyes: Conjunctivae are normal Head: Atraumatic HEENT: No congestion/rhinnorhea. Mucous membranes are moist.  Oropharynx non-erythematous Neck:   Nontender with no meningismus, no masses, no stridor Cardiovascular: Normal rate, regular rhythm. Grossly normal heart sounds.  Good peripheral circulation. Respiratory: Normal respiratory effort.  No retractions. Lungs CTAB. Abdominal: Soft and nontender. No distention. No guarding no rebound Back:  There is no focal tenderness or step off.  there is no midline tenderness there are no lesions noted. there is no CVA tenderness  Musculoskeletal: No lower extremity tenderness, no upper extremity tenderness. No joint effusions, no DVT signs strong distal pulses no edema Neurologic:  Normal speech and language. No gross focal neurologic deficits are appreciated.  Skin:  Skin is warm, dry and intact. No rash noted. Psychiatric: Mood and affect are normal. Speech and behavior are normal.  ____________________________________________   LABS (all labs ordered are listed, but only abnormal results are displayed)  Labs Reviewed  COMPREHENSIVE METABOLIC PANEL - Abnormal; Notable for the following:       Result Value   Glucose, Bld 139 (*)    Calcium 8.3 (*)    Total Protein 6.4 (*)    AST 442 (*)    ALT 236 (*)    Total Bilirubin 1.3 (*)    All other components within normal limits  CBC WITH DIFFERENTIAL/PLATELET - Abnormal; Notable for the following:    RBC 3.04 (*)    Hemoglobin 11.4 (*)    HCT 32.8 (*)    MCV 108.1 (*)    MCH 37.5 (*)    RDW 15.8 (*)    Platelets 447 (*)    Neutro Abs 7.7 (*)    Lymphs Abs 0.1 (*)    All other components within normal limits  URINALYSIS, COMPLETE (UACMP) WITH MICROSCOPIC - Abnormal; Notable for the  following:    Color, Urine YELLOW (*)    APPearance CLEAR (*)    Protein, ur 30 (*)    All other components within normal limits  LACTIC ACID, PLASMA  TROPONIN I  AMMONIA    Pertinent labs  results that were available during my care of the patient were reviewed by me and considered in my medical decision making (see chart for details). ____________________________________________  EKG  I personally interpreted any EKGs ordered by me or triage  ____________________________________________  RADIOLOGY  Pertinent labs & imaging results that were available during my care of the  patient were reviewed by me and considered in my medical decision making (see chart for details). If possible, patient and/or family made aware of any abnormal findings. ____________________________________________    PROCEDURES  Procedure(s) performed: None  Procedures  Critical Care performed: None  ____________________________________________   INITIAL IMPRESSION / ASSESSMENT AND PLAN / ED COURSE  Pertinent labs & imaging results that were available during my care of the patient were reviewed by me and considered in my medical decision making (see chart for details).  Very well-appearing gentleman who currently had a fever at home. No significant fever noted here. He is not confused use as baseline he is eating and drinking, workup is reassuring except for the fact the patient is elevated liver function tests, unclear what the significance of this is. He does not have any abdominal pain and he is not vomiting. Liver function tests are however of concern. We will obtain a CT scan of his abdomen and pelvis to ensure that there is no occult source of fever. Serial abdominal exams are reassuring lactic acid is negative, we will continue to evaluate the patient here but he has a very reassuring workup and no evidence of active ongoing infection. Signed out at the end of my shift to dr. York Cerise.  Clinical  Course as of May 01 2339  Fri Apr 30, 2017  2255 Assuming care from Dr. Alphonzo Lemmings.  In short, Jack Rios is a 81 y.o. male with a chief complaint of fever and AMS.  Refer to the original H&P for additional details.  The current plan of care is to follow up CT and reassess need for admission vs discharge.   [CF]    Clinical Course User Index [CF] Loleta Rose, MD   ____________________________________________   FINAL CLINICAL IMPRESSION(S) / ED DIAGNOSES  Final diagnoses:  Elevated LFTs      This chart was dictated using voice recognition software.  Despite best efforts to proofread,  errors can occur which can change meaning.      Jeanmarie Plant, MD 04/30/17 [id number]

## 2017-04-30 NOTE — ED Triage Notes (Signed)
Patient presents to ED via [facility] from home due to high fevers. Last tylenol was 45 min PTA, . EMS reports oral temp of 102.7. Patient also reports urinary incontinence, which is not normal for him. EMS gave bolus of NS in route. CBG 142.
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
## 2017-05-01 ENCOUNTER — Emergency Department: Payer: Medicare Other

## 2017-05-01 ENCOUNTER — Inpatient Hospital Stay: Payer: Medicare Other

## 2017-05-01 DIAGNOSIS — K8050 Calculus of bile duct without cholangitis or cholecystitis without obstruction: Secondary | ICD-10-CM | POA: Diagnosis not present

## 2017-05-01 DIAGNOSIS — M1990 Unspecified osteoarthritis, unspecified site: Secondary | ICD-10-CM | POA: Diagnosis present

## 2017-05-01 DIAGNOSIS — K219 Gastro-esophageal reflux disease without esophagitis: Secondary | ICD-10-CM | POA: Diagnosis present

## 2017-05-01 DIAGNOSIS — R740 Nonspecific elevation of levels of transaminase and lactic acid dehydrogenase [LDH]: Secondary | ICD-10-CM

## 2017-05-01 DIAGNOSIS — Z91048 Other nonmedicinal substance allergy status: Secondary | ICD-10-CM | POA: Diagnosis not present

## 2017-05-01 DIAGNOSIS — Z87891 Personal history of nicotine dependence: Secondary | ICD-10-CM | POA: Diagnosis not present

## 2017-05-01 DIAGNOSIS — N400 Enlarged prostate without lower urinary tract symptoms: Secondary | ICD-10-CM | POA: Diagnosis present

## 2017-05-01 DIAGNOSIS — G629 Polyneuropathy, unspecified: Secondary | ICD-10-CM | POA: Diagnosis present

## 2017-05-01 DIAGNOSIS — J449 Chronic obstructive pulmonary disease, unspecified: Secondary | ICD-10-CM | POA: Diagnosis present

## 2017-05-01 DIAGNOSIS — R17 Unspecified jaundice: Secondary | ICD-10-CM | POA: Diagnosis not present

## 2017-05-01 DIAGNOSIS — K8033 Calculus of bile duct with acute cholangitis with obstruction: Secondary | ICD-10-CM | POA: Diagnosis not present

## 2017-05-01 DIAGNOSIS — K838 Other specified diseases of biliary tract: Secondary | ICD-10-CM | POA: Diagnosis not present

## 2017-05-01 DIAGNOSIS — Z7982 Long term (current) use of aspirin: Secondary | ICD-10-CM | POA: Diagnosis not present

## 2017-05-01 DIAGNOSIS — A419 Sepsis, unspecified organism: Secondary | ICD-10-CM | POA: Diagnosis present

## 2017-05-01 DIAGNOSIS — R410 Disorientation, unspecified: Secondary | ICD-10-CM | POA: Diagnosis present

## 2017-05-01 DIAGNOSIS — K8031 Calculus of bile duct with cholangitis, unspecified, with obstruction: Secondary | ICD-10-CM | POA: Diagnosis present

## 2017-05-01 DIAGNOSIS — Z8711 Personal history of peptic ulcer disease: Secondary | ICD-10-CM | POA: Diagnosis not present

## 2017-05-01 DIAGNOSIS — K315 Obstruction of duodenum: Secondary | ICD-10-CM | POA: Diagnosis not present

## 2017-05-01 DIAGNOSIS — R509 Fever, unspecified: Secondary | ICD-10-CM | POA: Diagnosis not present

## 2017-05-01 DIAGNOSIS — R7401 Elevation of levels of liver transaminase levels: Secondary | ICD-10-CM

## 2017-05-01 DIAGNOSIS — K8071 Calculus of gallbladder and bile duct without cholecystitis with obstruction: Secondary | ICD-10-CM | POA: Diagnosis present

## 2017-05-01 DIAGNOSIS — K8020 Calculus of gallbladder without cholecystitis without obstruction: Secondary | ICD-10-CM | POA: Diagnosis not present

## 2017-05-01 DIAGNOSIS — Z7951 Long term (current) use of inhaled steroids: Secondary | ICD-10-CM | POA: Diagnosis not present

## 2017-05-01 DIAGNOSIS — Z79899 Other long term (current) drug therapy: Secondary | ICD-10-CM | POA: Diagnosis not present

## 2017-05-01 DIAGNOSIS — R911 Solitary pulmonary nodule: Secondary | ICD-10-CM | POA: Diagnosis present

## 2017-05-01 HISTORY — DX: Elevation of levels of liver transaminase levels: R74.01

## 2017-05-01 LAB — TSH: TSH: 0.656 u[IU]/mL (ref 0.350–4.500)

## 2017-05-01 LAB — GLUCOSE, CAPILLARY: Glucose-Capillary: 84 mg/dL (ref 65–99)

## 2017-05-01 MED ORDER — ACETAMINOPHEN 650 MG RE SUPP
650.0000 mg | Freq: Four times a day (QID) | RECTAL | Status: DC | PRN
Start: 2017-05-01 — End: 2017-05-05

## 2017-05-01 MED ORDER — ONDANSETRON HCL 4 MG/2ML IJ SOLN: 4.0000 mg | Freq: Four times a day (QID) | INTRAMUSCULAR | Status: DC | PRN

## 2017-05-01 MED ORDER — FINASTERIDE 5 MG PO TABS
5.0000 mg | ORAL_TABLET | Freq: Every day | ORAL | Status: DC
  Administered 2017-05-01 – 2017-05-05 (×5): 5 mg via ORAL
  Filled 2017-05-01 (×5): qty 1

## 2017-05-01 MED ORDER — HYDROXYUREA 500 MG PO CAPS
500.0000 mg | ORAL_CAPSULE | Freq: Every day | ORAL | Status: DC
  Administered 2017-05-01 – 2017-05-04 (×4): 500 mg via ORAL
  Filled 2017-05-01 (×5): qty 1

## 2017-05-01 MED ORDER — TERAZOSIN HCL 5 MG PO CAPS
10.0000 mg | ORAL_CAPSULE | Freq: Every day | ORAL | Status: DC
  Administered 2017-05-01 – 2017-05-04 (×4): 10 mg via ORAL
  Filled 2017-05-01 (×5): qty 2

## 2017-05-01 MED ORDER — ACETAMINOPHEN 325 MG PO TABS: 650.0000 mg | ORAL_TABLET | Freq: Four times a day (QID) | ORAL | Status: DC | PRN

## 2017-05-01 MED ORDER — INFLUENZA VAC SPLIT HIGH-DOSE 0.5 ML IM SUSY
0.5000 mL | PREFILLED_SYRINGE | INTRAMUSCULAR | Status: DC
Start: 2017-05-02 — End: 2017-05-05
  Filled 2017-05-01: qty 0.5

## 2017-05-01 MED ORDER — PANTOPRAZOLE SODIUM 40 MG PO TBEC
40.0000 mg | DELAYED_RELEASE_TABLET | Freq: Two times a day (BID) | ORAL | Status: DC
  Administered 2017-05-01 – 2017-05-05 (×8): 40 mg via ORAL
  Filled 2017-05-01 (×8): qty 1

## 2017-05-01 MED ORDER — TRAMADOL HCL 50 MG PO TABS
50.0000 mg | ORAL_TABLET | Freq: Two times a day (BID) | ORAL | Status: DC | PRN
  Administered 2017-05-05: 11:00:00 50 mg via ORAL
  Filled 2017-05-01: qty 1

## 2017-05-01 MED ORDER — FLUTICASONE PROPIONATE 50 MCG/ACT NA SUSP
2.0000 | Freq: Two times a day (BID) | NASAL | Status: DC
  Administered 2017-05-01 – 2017-05-04 (×7): 2 via NASAL
  Filled 2017-05-01: qty 16

## 2017-05-01 MED ORDER — ENOXAPARIN SODIUM 40 MG/0.4ML ~~LOC~~ SOLN
40.0000 mg | SUBCUTANEOUS | Status: DC
  Administered 2017-05-01 – 2017-05-04 (×3): 40 mg via SUBCUTANEOUS
  Filled 2017-05-01 (×4): qty 0.4

## 2017-05-01 MED ORDER — HYPROMELLOSE (GONIOSCOPIC) 2.5 % OP SOLN: 1.0000 [drp] | OPHTHALMIC | Status: DC | PRN

## 2017-05-01 MED ORDER — PIPERACILLIN-TAZOBACTAM 3.375 G IVPB
3.3750 g | Freq: Three times a day (TID) | INTRAVENOUS | Status: DC
  Administered 2017-05-01 – 2017-05-05 (×10): 3.375 g via INTRAVENOUS
  Filled 2017-05-01 (×11): qty 50

## 2017-05-01 MED ORDER — TIOTROPIUM BROMIDE MONOHYDRATE 18 MCG IN CAPS
18.0000 ug | ORAL_CAPSULE | Freq: Every day | RESPIRATORY_TRACT | Status: DC
  Administered 2017-05-01 – 2017-05-05 (×5): 18 ug via RESPIRATORY_TRACT
  Filled 2017-05-01: qty 5

## 2017-05-01 MED ORDER — BUDESONIDE 0.25 MG/2ML IN SUSP
0.2500 mg | Freq: Two times a day (BID) | RESPIRATORY_TRACT | Status: DC
  Administered 2017-05-01 – 2017-05-04 (×7): 0.25 mg via RESPIRATORY_TRACT
  Filled 2017-05-01 (×7): qty 2

## 2017-05-01 MED ORDER — MOMETASONE FUROATE 220 MCG/INH IN AEPB: 2.0000 | INHALATION_SPRAY | Freq: Every day | RESPIRATORY_TRACT | Status: DC

## 2017-05-01 MED ORDER — SODIUM CHLORIDE 0.9 % IV SOLN
INTRAVENOUS | Status: DC
  Administered 2017-05-01 – 2017-05-04 (×4): via INTRAVENOUS

## 2017-05-01 MED ORDER — POLYVINYL ALCOHOL 1.4 % OP SOLN
1.0000 [drp] | OPHTHALMIC | Status: DC | PRN
  Filled 2017-05-01: qty 15

## 2017-05-01 MED ORDER — ONDANSETRON HCL 4 MG PO TABS: 4.0000 mg | ORAL_TABLET | Freq: Four times a day (QID) | ORAL | Status: DC | PRN

## 2017-05-01 MED ORDER — GABAPENTIN 300 MG PO CAPS
600.0000 mg | ORAL_CAPSULE | Freq: Three times a day (TID) | ORAL | Status: DC
  Administered 2017-05-01 – 2017-05-05 (×12): 600 mg via ORAL
  Filled 2017-05-01 (×13): qty 2

## 2017-05-01 MED ORDER — FERROUS SULFATE 325 (65 FE) MG PO TABS
325.0000 mg | ORAL_TABLET | Freq: Every evening | ORAL | Status: DC
  Administered 2017-05-01 – 2017-05-03 (×3): 325 mg via ORAL
  Filled 2017-05-01 (×3): qty 1

## 2017-05-01 MED ORDER — DIPHENHYDRAMINE HCL 25 MG PO CAPS
25.0000 mg | ORAL_CAPSULE | Freq: Four times a day (QID) | ORAL | Status: DC
  Administered 2017-05-01 – 2017-05-05 (×10): 25 mg via ORAL
  Filled 2017-05-01 (×10): qty 1

## 2017-05-01 MED ORDER — DOCUSATE SODIUM 100 MG PO CAPS
100.0000 mg | ORAL_CAPSULE | Freq: Two times a day (BID) | ORAL | Status: DC
  Administered 2017-05-01 – 2017-05-03 (×3): 100 mg via ORAL
  Filled 2017-05-01 (×7): qty 1

## 2017-05-01 MED ORDER — LORAZEPAM 2 MG/ML IJ SOLN
0.5000 mg | INTRAMUSCULAR | Status: DC | PRN
  Administered 2017-05-01 – 2017-05-04 (×3): 0.5 mg via INTRAVENOUS
  Filled 2017-05-01 (×3): qty 1

## 2017-05-01 MED ORDER — IPRATROPIUM BROMIDE 0.06 % NA SOLN
2.0000 | Freq: Two times a day (BID) | NASAL | Status: DC
  Administered 2017-05-01 – 2017-05-05 (×4): 2 via NASAL
  Filled 2017-05-01: qty 15

## 2017-05-01 MED ORDER — ASPIRIN 81 MG PO CHEW
81.0000 mg | CHEWABLE_TABLET | Freq: Every day | ORAL | Status: DC
  Administered 2017-05-01 – 2017-05-05 (×5): 81 mg via ORAL
  Filled 2017-05-01 (×5): qty 1

## 2017-05-01 NOTE — Progress Notes (Signed)
Pharmacy Antibiotic Note  Jack Rios is a 81 y.o. male admitted on 04/30/2017 with IAI.  Pharmacy has been consulted for Zosyn dosing.  Plan: Zosyn 3.375g IV q8h (4 hour infusion).  Height:  (172.7 cm) Weight: 136 lb 1.6 oz (61.7 kg) IBW/kg (Calculated) : 68.4  Temp (24hrs), Avg:99 F (37.2 C), Min:98.2 F (36.8 C), Max:100 F (37.8 C)   Recent Labs Lab 04/30/17 1908 04/30/17 1910  WBC 8.5  --   CREATININE 1.03  --   LATICACIDVEN  --  1.2    Estimated Creatinine Clearance: 50.8 mL/min (by C-G formula based on SCr of 1.03 mg/dL).    Allergies  Allergen Reactions  . Molds & Smuts     Antimicrobials this admission: Zosyn 9/22 >>   Dose adjustments this admission:   Microbiology results: 9/22 BCx: NGTD  Thank you for allowing pharmacy to be a part of this patient's care.  Luisa Hart D 05/01/2017 8:41 AM

## 2017-05-01 NOTE — Progress Notes (Addendum)
[employer] - [place] [facility]                                                                                                                                                                                  Patient Demographics   Jack Rios, is a 81 y.o. male, DOB - 05-09-1936, ZOX:[id number]  Admit date - 04/30/2017   Admitting Physician Arnaldo Natal, MD  Outpatient Primary MD for the patient is System, Pcp Not In   LOS - 0  Subjective: Patient admitted with fever. He states that he is not having any abdominal pain and wants to go home.    Review of Systems:   CONSTITUTIONAL: No documented fever. No fatigue, weakness. No weight gain, no weight loss.  EYES: No blurry or double vision.  ENT: No tinnitus. No postnasal drip. No redness of the oropharynx.  RESPIRATORY: No cough, no wheeze, no hemoptysis. No dyspnea.  CARDIOVASCULAR: No chest pain. No orthopnea. No palpitations. No syncope.  GASTROINTESTINAL: No nausea, no vomiting or diarrhea. No abdominal pain. No melena or hematochezia.  GENITOURINARY: No dysuria or hematuria.  ENDOCRINE: No polyuria or nocturia. No heat or cold intolerance.  HEMATOLOGY: No anemia. No bruising. No bleeding.  INTEGUMENTARY: No rashes. No lesions.  MUSCULOSKELETAL: No arthritis. No swelling. No gout.  NEUROLOGIC: No numbness, tingling, or ataxia. No seizure-type activity.  PSYCHIATRIC: No anxiety. No insomnia. No ADD.    Vitals:   Vitals:   05/01/17 0101 05/01/17 0130 05/01/17 0230 05/01/17 0418  BP:  137/88 129/76 139/67  Pulse:  65 75 66  Resp:  Temp: 98.9 F (37.2 C)   98.2 F (36.8 C)  TempSrc: Oral   Oral  SpO2:  97% 95%   Weight:    136 lb 1.6 oz (61.7 kg)  Height:     (1.727 m)    Wt Readings from Last 3 Encounters:  05/01/17 136 lb 1.6 oz (61.7 kg)  09/26/15 126 lb 14.4 oz (57.6 kg)     Intake/Output Summary (Last 24 hours) at 05/01/17 1316 Last data filed at 05/01/17 0620   Gross per 24 hour  Intake           704.17 ml  Output                0 ml  Net           704.17 ml    Physical Exam:   GENERAL: Pleasant-appearing in no apparent distress.  HEAD, EYES, EARS, NOSE AND THROAT: Atraumatic, normocephalic. Extraocular muscles are intact. Pupils equal and reactive to light. Sclerae anicteric. No conjunctival injection. No oro-pharyngeal erythema.  NECK: Supple. There is no jugular venous distention. No bruits, no lymphadenopathy, no thyromegaly.  HEART: Regular rate and rhythm,. No murmurs, no rubs, no clicks.  LUNGS: Clear to auscultation bilaterally. No rales or rhonchi. No wheezes.  ABDOMEN: Soft, flat, nontender, nondistended. Has good bowel sounds. No hepatosplenomegaly appreciated.  EXTREMITIES: No evidence of any cyanosis, clubbing, or peripheral edema.  +2 pedal and radial pulses bilaterally.  NEUROLOGIC: The patient is alert, awake, and oriented x3 with no focal motor or sensory deficits appreciated bilaterally.  SKIN: Moist and warm with no rashes appreciated.  Psych: Not anxious, depressed LN: No inguinal LN enlargement    Antibiotics   Anti-infectives    Start     Dose/Rate Route Frequency Ordered Stop   05/01/17 0930  piperacillin-tazobactam (ZOSYN) IVPB 3.375 g     3.375 g 12.5 mL/hr over 240 Minutes Intravenous Every 8 hours 05/01/17 0817        Medications   Scheduled Meds: . aspirin  81 mg Oral Daily  . budesonide (PULMICORT) nebulizer solution  0.25 mg Nebulization BID  . diphenhydrAMINE  25 mg Oral Q6H  . docusate sodium  100 mg Oral BID  . enoxaparin (LOVENOX) injection  40 mg Subcutaneous Q24H  . ferrous sulfate  325 mg Oral QPM  . finasteride  5 mg Oral Daily  . fluticasone  2 spray Each Nare BID  . gabapentin  600 mg Oral TID  . hydroxyurea  500 mg Oral QHS  . [START ON 05/02/2017] Influenza vac split quadrivalent PF  0.5 mL Intramuscular Tomorrow-1000  . ipratropium  2 spray Each Nare Q12H  . pantoprazole  40 mg Oral BID   . terazosin  10 mg Oral QHS  . tiotropium  18 mcg Inhalation Daily   Continuous Infusions: . sodium chloride 50 mL/hr at 05/01/17 1304  . piperacillin-tazobactam (ZOSYN)  IV Stopped (05/01/17 1125)   PRN Meds:.acetaminophen **OR** acetaminophen, ondansetron **OR** ondansetron (ZOFRAN) IV, polyvinyl alcohol, traMADol   Data Review:   Micro Results Recent Results (from the past 240 hour(s))  Culture, blood (routine x 2)     Status: None (Preliminary result)   Collection Time: 05/01/17  1:09 AM  Result Value Ref Range Status   Specimen Description BLOOD RIGHT ANTECUBITAL  Final   Special Requests   Final    BOTTLES DRAWN AEROBIC AND ANAEROBIC Blood Culture results may not be optimal due to an excessive volume of blood received in culture bottles   Culture NO GROWTH < 12 HOURS  Final   Report Status PENDING  Incomplete  Culture, blood (routine x 2)     Status: None (Preliminary result)   Collection Time: 05/01/17  1:09 AM  Result Value Ref Range Status   Specimen Description BLOOD RIGHT ANTECUBITAL  Final   Special Requests   Final    BOTTLES DRAWN AEROBIC AND ANAEROBIC Blood Culture adequate volume   Culture NO GROWTH < 12 HOURS  Final   Report Status PENDING  Incomplete    Radiology Reports Dg Chest 2 View  Result Date: 04/30/2017 CLINICAL DATA:  High fevers.  Urinary incontinence. EXAM: CHEST  2 VIEW COMPARISON:  09/25/2015. FINDINGS: Normal sized heart. Clear lungs. Left shoulder degenerative changes. IMPRESSION: No acute abnormality. Electronically Signed   By: Beckie Salts M.D.   On: 04/30/2017 19:37   Ct Abdomen Pelvis W Contrast  Result Date: 04/30/2017 CLINICAL DATA:  High fever, new urinary incontinence EXAM: CT ABDOMEN AND PELVIS WITH CONTRAST TECHNIQUE: Multidetector CT imaging of  the abdomen and pelvis was performed using the standard protocol following bolus administration of intravenous contrast. CONTRAST:  ISOVUE-300 IOPAMIDOL (ISOVUE-300) INJECTION 61%  COMPARISON:  None. FINDINGS: Lower chest: Lung bases are clear. Hepatobiliary: No focal hepatic lesion. Mild intrahepatic duct dilatation. Common bile duct is prominent at 11 mm (image 26, series 79). Within the lumen gallbladder, there is lobular high-density material which appears to layer dependently. Difficult to tell if this high-density material or enhancing tissue. Largest rounded lesion measures 2 cm. Gallbladder is not distended. Pancreas: Pancreas is atrophic. Note pancreatic lesion present. No duct dilatation. Spleen: Normal spleen Adrenals/urinary tract: Adrenal glands normal. There bilateral nonenhancing renal cysts. Ureters are normal. The bladder is mildly distended. No bladder lesion identified. Prostate gland does indent the base the bladder. Stomach/Bowel: Stomach, small-bowel colon are unremarkable. Vascular/Lymphatic: Abdominal aorta is normal caliber with atherosclerotic calcification. There is no retroperitoneal or periportal lymphadenopathy. No pelvic lymphadenopathy. Reproductive: Prostate Other: No free fluid. Musculoskeletal: No aggressive osseous lesion. IMPRESSION: 1. Lobular high-density lobular lesions within the gallbladder may represent formed sludge. Cannot exclude enhancing tissue (follow-up or neoplasm). Recommend RIGHT upper quadrant ultrasound to evaluate for vascular flow within the larger of these lesions. The vascular flow present within lesion, consider contrast MRI for further evaluation. Favor formed high-density sludge. 2. Mild intrahepatic and extrahepatic duct dilatation. Recommend correlation with bilirubin levels and if elevated consider MRCP for further evaluation. 3. Atrophic pancreas appears chronic. No mass lesion. No duct dilatation. 4.  Aortic Atherosclerosis (ICD10-I70.0). Electronically Signed   By: Genevive Bi M.D.   On: 04/30/2017 23:51   US Abdomen Limited Ruq  Result Date: 05/01/2017 CLINICAL DATA:  Elevated liver function studies. EXAM: ULTRASOUND  ABDOMEN LIMITED RIGHT UPPER QUADRANT COMPARISON:  CT abdomen and pelvis 04/30/2017 FINDINGS: Gallbladder: Multiple stones and sludge demonstrated in the gallbladder. Largest stone measures about 2 cm diameter. No flow is demonstrated within the intraluminal contents. Gallbladder is mildly distended. No wall thickening or edema. Common bile duct: Diameter: 9.2 mm. This represents mild extrahepatic bile duct dilatation. No obstructing stone is demonstrated but the distal common bile duct is obscured by bowel gas. Review of previous CT shows increased density in the distal common bile duct likely representing common duct stones. Mild intrahepatic bile duct dilatation is also demonstrated. Liver: No focal lesion identified. Within normal limits in parenchymal echogenicity. Portal vein is patent on color Doppler imaging with normal direction of blood flow towards the liver. Incidental note of a simple appearing cyst in the right kidney measuring 6.4 cm maximal diameter. IMPRESSION: 1. Cholelithiasis with multiple stones and sludge in the gallbladder. No additional changes to suggest cholecystitis. 2. Dilated intra and extrahepatic bile ducts. Probable choledocholithiasis shown at previous CT. The distal bile duct is not seen on ultrasound due to overlying bowel gas. 3. Benign-appearing cyst in the right kidney. Electronically Signed   By: Burman Nieves M.D.   On: 05/01/2017 03:53     CBC  Recent Labs Lab 04/30/17 1908  WBC 8.5  HGB 11.4*  HCT 32.8*  PLT 447*  MCV 108.1*  MCH 37.5*  MCHC 34.7  RDW 15.8*  LYMPHSABS 0.1*  MONOABS 0.6  EOSABS 0.0  BASOSABS 0.1    Chemistries   Recent Labs Lab 04/30/17 1908  NA 135  K 3.8  CL 104  CO2 26  GLUCOSE 139*  BUN 20  CREATININE 1.03  CALCIUM 8.3*  AST 442*  ALT 236*  ALKPHOS 110  BILITOT 1.3*   ------------------------------------------------------------------------------------------------------------------ estimated  creatinine clearance  is 50.8 mL/min (by C-G formula based on SCr of 1.03 mg/dL). ------------------------------------------------------------------------------------------------------------------ No results for input(s): HGBA1C in the last 72 hours. ------------------------------------------------------------------------------------------------------------------ No results for input(s): CHOL, HDL, LDLCALC, TRIG, CHOLHDL, LDLDIRECT in the last 72 hours. ------------------------------------------------------------------------------------------------------------------  Recent Labs  04/30/17 2052  TSH 0.656   ------------------------------------------------------------------------------------------------------------------ No results for input(s): VITAMINB12, FOLATE, FERRITIN, TIBC, IRON, RETICCTPCT in the last 72 hours.  Coagulation profile No results for input(s): INR, PROTIME in the last 168 hours.  No results for input(s): DDIMER in the last 72 hours.  Cardiac Enzymes  Recent Labs Lab 04/30/17 1908  TROPONINI <0.03   ------------------------------------------------------------------------------------------------------------------ Invalid input(s): POCBNP    Assessment & Plan   1.  Fever:  Likely related to intra-abdominal infection  continue IV Zosyn 2. Transaminitis: Intra-and extrahepatic dilatation;I have already ordered MRCP results are pending Gi consult pending, repeat LFTs in the morning 3. Asthma/COPD: Continue inhaled corticosteroid, Spiriva and albuterol. 4. BPH: Continue Proscar and Terazosin 5. DVT prophylaxis: Lovenox 6. GI prophylaxis: Pantoprazole      Code Status Orders        Start     Ordered   05/01/17 0424  Full code  Continuous     05/01/17 0423    Code Status History    Date Active Date Inactive Code Status Order ID Comments User Context   09/26/2015 12:32 AM 09/26/2015  6:08 PM Full Code [id number]  Ihor Austin, MD ED           Consults gi   DVT  Prophylaxis  Lovenox   Lab Results  Component Value Date   PLT 447 (H) 04/30/2017     Time Spent in minutes   Greater than 50% of time spent in care coordination and counseling patient regarding the condition and plan of care.   Auburn Bilberry M.D on 05/01/2017 at 1:16 PM  Between 7am to 6pm - Pager - [phone]  After 6pm go to [email or web address] - password [email or web address] [facility]  [facility]   Office  [phone]

## 2017-05-01 NOTE — Progress Notes (Addendum)
Dr Eliane Decree made aware that MRCP result in the computer

## 2017-05-01 NOTE — H&P (Signed)
Jack Rios is an 81 y.o. male.   Chief Complaint: fever HPI: the patient with past medical history of asthmas well as a lung nodule presents emergency department with fever and altered mental status.the patient's wife reports MAXIMUM TEMPERATURE of 102.39F. She had the patient Tylenol but the patient seemed to be very lethargic. He was given normal saline en route by EMS. Upon arrival to the emergency department the patient was afebrile but laboratory evaluation showed transaminitis, and CT of the abdomen showed some lesions within the gallbladder as well as intra-and extrahepatic duct dilatation. Due to continued weakness as well as possible choledocholithiasis the emergency department staff called the hospitalist service for admission.   Past Medical History:  Diagnosis Date  . Arthritis   . Asthma   . Lung nodule   . Neuropathy   . Peptic ulcer     Past Surgical History:  Procedure Laterality Date  . CERVICAL SPINE SURGERY      Family History  Problem Relation Age of Onset  . Arthritis Brother    Social History:  reports that he has quit smoking. He does not have any smokeless tobacco history on file. He reports that he does not drink alcohol or use drugs.  Allergies:  Allergies  Allergen Reactions  . Molds & Smuts     Medications Prior to Admission  Medication Sig Dispense Refill  . albuterol (PROVENTIL HFA;VENTOLIN HFA) 108 (90 Base) MCG/ACT inhaler Inhale 2 puffs into the lungs every 4 (four) hours as needed for wheezing or shortness of breath.    Marland Kitchen aspirin 81 MG chewable tablet Chew 81 mg by mouth daily.    . chlorpheniramine (CHLOR-TRIMETON) 4 MG tablet Take 4 mg by mouth at bedtime.    . docusate sodium (COLACE) 100 MG capsule Take 100 mg by mouth daily.     . ferrous sulfate 325 (65 FE) MG tablet Take 325 mg by mouth every evening.     . finasteride (PROSCAR) 5 MG tablet Take 5 mg by mouth daily.    . fluticasone (FLONASE) 50 MCG/ACT nasal spray Place 2 sprays  into both nostrils 2 (two) times daily.    Marland Kitchen gabapentin (NEURONTIN) 300 MG capsule Take 600 mg by mouth 3 (three) times daily. For nerve irratation     . hydroxypropyl methylcellulose / hypromellose (ISOPTO TEARS / GONIOVISC) 2.5 % ophthalmic solution Place 1 drop into both eyes as needed for dry eyes.     . hydroxyurea (HYDREA) 500 MG capsule Take 500 mg by mouth at bedtime. May take with food to minimize GI side effects.    Marland Kitchen ipratropium (ATROVENT) 0.06 % nasal spray Place 2 sprays into both nostrils every 12 (twelve) hours.    . mometasone (ASMANEX 60 METERED DOSES) 220 MCG/INH inhaler Inhale 2 puffs into the lungs daily.    . pantoprazole (PROTONIX) 40 MG tablet Take 40 mg by mouth 2 (two) times daily.    Marland Kitchen terazosin (HYTRIN) 10 MG capsule Take 10 mg by mouth at bedtime.    Marland Kitchen tiotropium (SPIRIVA) 18 MCG inhalation capsule Place 18 mcg into inhaler and inhale daily.    . traMADol (ULTRAM) 50 MG tablet Take 50 mg by mouth every 12 (twelve) hours as needed for moderate pain.       Results for orders placed or performed during the hospital encounter of 04/30/17 (from the past 48 hour(s))  Comprehensive metabolic panel     Status: Abnormal   Collection Time: 04/30/17  7:08 PM  Result Value Ref Range   Sodium 135 135 - 145 mmol/L   Potassium 3.8 3.5 - 5.1 mmol/L    Comment: HEMOLYSIS AT THIS LEVEL MAY AFFECT RESULT   Chloride 104 101 - 111 mmol/L   CO2 26 22 - 32 mmol/L   Glucose, Bld 139 (H) 65 - 99 mg/dL   BUN 20 6 - 20 mg/dL   Creatinine, Ser 1.03 0.61 - 1.24 mg/dL   Calcium 8.3 (L) 8.9 - 10.3 mg/dL   Total Protein 6.4 (L) 6.5 - 8.1 g/dL   Albumin 3.7 3.5 - 5.0 g/dL   AST 442 (H) 15 - 41 U/L    Comment: HEMOLYSIS AT THIS LEVEL MAY AFFECT RESULT   ALT 236 (H) 17 - 63 U/L   Alkaline Phosphatase 110 38 - 126 U/L   Total Bilirubin 1.3 (H) 0.3 - 1.2 mg/dL    Comment: HEMOLYSIS AT THIS LEVEL MAY AFFECT RESULT   GFR calc non Af Amer >60 >60 mL/min   GFR calc Af Amer >60 >60 mL/min     Comment: (NOTE) The eGFR has been calculated using the CKD EPI equation. This calculation has not been validated in all clinical situations. eGFR's persistently <60 mL/min signify possible Chronic Kidney Disease.    Anion gap 5 5 - 15  CBC with Differential     Status: Abnormal   Collection Time: 04/30/17  7:08 PM  Result Value Ref Range   WBC 8.5 3.8 - 10.6 K/uL   RBC 3.04 (L) 4.40 - 5.90 MIL/uL   Hemoglobin 11.4 (L) 13.0 - 18.0 g/dL   HCT 32.8 (L) 40.0 - 52.0 %   MCV 108.1 (H) 80.0 - 100.0 fL   MCH 37.5 (H) 26.0 - 34.0 pg   MCHC 34.7 32.0 - 36.0 g/dL   RDW 15.8 (H) 11.5 - 14.5 %   Platelets 447 (H) 150 - 440 K/uL   Neutrophils Relative % 91 %   Neutro Abs 7.7 (H) 1.4 - 6.5 K/uL   Lymphocytes Relative 1 %   Lymphs Abs 0.1 (L) 1.0 - 3.6 K/uL   Monocytes Relative 7 %   Monocytes Absolute 0.6 0.2 - 1.0 K/uL   Eosinophils Relative 0 %   Eosinophils Absolute 0.0 0 - 0.7 K/uL   Basophils Relative 1 %   Basophils Absolute 0.1 0 - 0.1 K/uL  Troponin I     Status: None   Collection Time: 04/30/17  7:08 PM  Result Value Ref Range   Troponin I <0.03 <0.03 ng/mL  Lactic acid, plasma     Status: None   Collection Time: 04/30/17  7:10 PM  Result Value Ref Range   Lactic Acid, Venous 1.2 0.5 - 1.9 mmol/L  Urinalysis, Complete w Microscopic     Status: Abnormal   Collection Time: 04/30/17  8:23 PM  Result Value Ref Range   Color, Urine YELLOW (A) YELLOW   APPearance CLEAR (A) CLEAR   Specific Gravity, Urine 1.018 1.005 - 1.030   pH 6.0 5.0 - 8.0   Glucose, UA NEGATIVE NEGATIVE mg/dL   Hgb urine dipstick NEGATIVE NEGATIVE   Bilirubin Urine NEGATIVE NEGATIVE   Ketones, ur NEGATIVE NEGATIVE mg/dL   Protein, ur 30 (A) NEGATIVE mg/dL   Nitrite NEGATIVE NEGATIVE   Leukocytes, UA NEGATIVE NEGATIVE   RBC / HPF 6-30 0 - 5 RBC/hpf   WBC, UA 0-5 0 - 5 WBC/hpf   Bacteria, UA NONE SEEN NONE SEEN   Squamous Epithelial / LPF NONE SEEN NONE   SEEN   Mucus PRESENT   Ammonia     Status: None    Collection Time: 04/30/17  8:52 PM  Result Value Ref Range   Ammonia 18 9 - 35 umol/L   Dg Chest 2 View  Result Date: 04/30/2017 CLINICAL DATA:  High fevers.  Urinary incontinence. EXAM: CHEST  2 VIEW COMPARISON:  09/25/2015. FINDINGS: Normal sized heart. Clear lungs. Left shoulder degenerative changes. IMPRESSION: No acute abnormality. Electronically Signed   By: Claudie Revering M.D.   On: 04/30/2017 19:37   Ct Abdomen Pelvis W Contrast  Result Date: 04/30/2017 CLINICAL DATA:  High fever, new urinary incontinence EXAM: CT ABDOMEN AND PELVIS WITH CONTRAST TECHNIQUE: Multidetector CT imaging of the abdomen and pelvis was performed using the standard protocol following bolus administration of intravenous contrast. CONTRAST:  130m ISOVUE-300 IOPAMIDOL (ISOVUE-300) INJECTION 61% COMPARISON:  None. FINDINGS: Lower chest: Lung bases are clear. Hepatobiliary: No focal hepatic lesion. Mild intrahepatic duct dilatation. Common bile duct is prominent at 11 mm (image 26, series 79). Within the lumen gallbladder, there is lobular high-density material which appears to layer dependently. Difficult to tell if this high-density material or enhancing tissue. Largest rounded lesion measures 2 cm. Gallbladder is not distended. Pancreas: Pancreas is atrophic. Note pancreatic lesion present. No duct dilatation. Spleen: Normal spleen Adrenals/urinary tract: Adrenal glands normal. There bilateral nonenhancing renal cysts. Ureters are normal. The bladder is mildly distended. No bladder lesion identified. Prostate gland does indent the base the bladder. Stomach/Bowel: Stomach, small-bowel colon are unremarkable. Vascular/Lymphatic: Abdominal aorta is normal caliber with atherosclerotic calcification. There is no retroperitoneal or periportal lymphadenopathy. No pelvic lymphadenopathy. Reproductive: Prostate Other: No free fluid. Musculoskeletal: No aggressive osseous lesion. IMPRESSION: 1. Lobular high-density lobular lesions  within the gallbladder may represent formed sludge. Cannot exclude enhancing tissue (follow-up or neoplasm). Recommend RIGHT upper quadrant ultrasound to evaluate for vascular flow within the larger of these lesions. The vascular flow present within lesion, consider contrast MRI for further evaluation. Favor formed high-density sludge. 2. Mild intrahepatic and extrahepatic duct dilatation. Recommend correlation with bilirubin levels and if elevated consider MRCP for further evaluation. 3. Atrophic pancreas appears chronic. No mass lesion. No duct dilatation. 4.  Aortic Atherosclerosis (ICD10-I70.0). Electronically Signed   By: SSuzy BouchardM.D.   On: 04/30/2017 23:51   [place]domen Limited Ruq  Result Date: 05/01/2017 CLINICAL DATA:  Elevated liver function studies. EXAM: ULTRASOUND ABDOMEN LIMITED RIGHT UPPER QUADRANT COMPARISON:  CT abdomen and pelvis 04/30/2017 FINDINGS: Gallbladder: Multiple stones and sludge demonstrated in the gallbladder. Largest stone measures about 2 cm diameter. No flow is demonstrated within the intraluminal contents. Gallbladder is mildly distended. No wall thickening or edema. Common bile duct: Diameter: 9.2 mm. This represents mild extrahepatic bile duct dilatation. No obstructing stone is demonstrated but the distal common bile duct is obscured by bowel gas. Review of previous CT shows increased density in the distal common bile duct likely representing common duct stones. Mild intrahepatic bile duct dilatation is also demonstrated. Liver: No focal lesion identified. Within normal limits in parenchymal echogenicity. Portal vein is patent on color Doppler imaging with normal direction of blood flow towards the liver. Incidental note of a simple appearing cyst in the right kidney measuring 6.4 cm maximal diameter. IMPRESSION: 1. Cholelithiasis with multiple stones and sludge in the gallbladder. No additional changes to suggest cholecystitis. 2. Dilated intra and extrahepatic bile  ducts. Probable choledocholithiasis shown at previous CT. The distal bile duct is not seen on ultrasound due to overlying  bowel gas. 3. Benign-appearing cyst in the right kidney. Electronically Signed   By: Lucienne Capers M.D.   On: 05/01/2017 03:53    Review of Systems  Constitutional: Positive for fever and malaise/fatigue. Negative for chills.  HENT: Negative for sore throat and tinnitus.   Eyes: Negative for blurred vision and redness.  Respiratory: Negative for cough and shortness of breath.   Cardiovascular: Negative for chest pain, palpitations, orthopnea and PND.  Gastrointestinal: Negative for abdominal pain, diarrhea, nausea and vomiting.  Genitourinary: Negative for dysuria, frequency and urgency.  Musculoskeletal: Negative for joint pain and myalgias.  Skin: Negative for rash.       No lesions  Neurological: Negative for speech change, focal weakness and weakness.  Endo/Heme/Allergies: Does not bruise/bleed easily.       No temperature intolerance  Psychiatric/Behavioral: Negative for depression and suicidal ideas.    Blood pressure 139/67, pulse 66, temperature 98.2 F (36.8 C), temperature source Oral, resp. rate 18, height 5' 8" (1.727 m), weight 57.2 kg (126 lb), SpO2 95 %. Physical Exam  Vitals reviewed. Constitutional: He is oriented to person, place, and time. He appears well-developed and well-nourished. No distress.  HENT:  Head: Normocephalic and atraumatic.  Mouth/Throat: No oropharyngeal exudate.  Eyes: Pupils are equal, round, and reactive to light. Conjunctivae and EOM are normal. No scleral icterus.  Neck: Normal range of motion. Neck supple. No JVD present. No tracheal deviation present. No thyromegaly present.  Cardiovascular: Normal rate, regular rhythm and normal heart sounds.  Exam reveals no gallop and no friction rub.   No murmur heard. Respiratory: Effort normal and breath sounds normal. No respiratory distress. He has no wheezes.  GI: Soft. Bowel  sounds are normal. He exhibits no distension. There is no tenderness.  Genitourinary:  Genitourinary Comments: Deferred  Musculoskeletal: Normal range of motion. He exhibits no edema.  Lymphadenopathy:    He has no cervical adenopathy.  Neurological: He is alert and oriented to person, place, and time. No cranial nerve deficit.  Skin: Skin is warm and dry. No rash noted. No erythema.  Psychiatric: He has a normal mood and affect. His behavior is normal. Judgment and thought content normal.     Assessment/Plan This is a 81 year old male admitted for fever and transaminitis. 1. Fever: Likely secondary to hepatic inflammation. Prophylactic Zosyn. Tylenol for symptomatic relief. White blood cell count is normal at this time. The patient is nontoxic in appearance although he clearly does not feel well. 2. Transaminitis: Intra-and extrahepatic dilatation; rule out choledocholithiasis. Differential diagnosis also includes reaction to hydroxyurea. (It is unclear why he is on this medication). Gastroenterology consulted for MRCP. 3. Asthma/COPD: Continue inhaled corticosteroid, Spiriva and albuterol. 4. BPH: Continue Proscar and Terrazas and 5. DVT prophylaxis: Lovenox 6. GI prophylaxis: Pantoprazole per home regimen The patient is a full code. Time spent on admission orders and patient care approximately 45 minutes  Harrie Foreman, MD 05/01/2017, 4:30 AM

## 2017-05-01 NOTE — Progress Notes (Signed)
Pt states that he feels jittery and anxious Dr Allena Katz made aware new order ativan 0.5-1mg  IV every 4 hours as needed

## 2017-05-01 NOTE — Consult Note (Addendum)
[name] MD, MRCP(U.K) [address]  [address]  [place], [place] [address]  Main: [phone]  Fax: [phone]  Consultation  Referring Provider:  Dr Allena Katz  Primary Care Physician:  System, Pcp Not In Primary Gastroenterologist:None        Reason for Consultation:     Transaminitis   Date of Admission:  04/30/2017 Date of Consultation:  05/01/2017         HPI:   Jack Rios is a 81 y.o. male Admitted with a fever of 102.7 via the ER. I have been consulted for transaminitis. RUQ USG shows chololithiasis with multiple stones and sludge in the gall bladder, CBD 9.2 mm , no clear obstructing stone seen  .Dilated intra and extrahepatic bile ducts CT-scan of the abdomen showed mild intra and extra hepatic biliary dilation.LFT's show T bilirubin 1.3, Elevated AST/ALT liver function tests's were normal in 09/2015   He denies any RUQ pain in the past or post prandial pain. Yesterday family says he was having chills, rigors, was confused and disorientated. Much better now after coming into the hospital . No pains or discomfort presently . Not on any blood thinners.   Past Medical History:  Diagnosis Date  . Arthritis   . Asthma   . Lung nodule   . Neuropathy   . Peptic ulcer     Past Surgical History:  Procedure Laterality Date  . CERVICAL SPINE SURGERY      Prior to Admission medications   Medication Sig Start Date End Date Taking? Authorizing Provider  albuterol (PROVENTIL HFA;VENTOLIN HFA) 108 (90 Base) MCG/ACT inhaler Inhale 2 puffs into the lungs every 4 (four) hours as needed for wheezing or shortness of breath.   Yes [provider]  aspirin 81 MG chewable tablet Chew 81 mg by mouth daily.   Yes [provider]  chlorpheniramine (CHLOR-TRIMETON) 4 MG tablet Take 4 mg by mouth at bedtime.   Yes [provider]  docusate sodium (COLACE) 100 MG capsule Take 100 mg by mouth daily.    Yes [provider]  ferrous sulfate 325 (65 FE)  MG tablet Take 325 mg by mouth every evening.    Yes [provider]  finasteride (PROSCAR) 5 MG tablet Take 5 mg by mouth daily.   Yes [provider]  fluticasone (FLONASE) 50 MCG/ACT nasal spray Place 2 sprays into both nostrils 2 (two) times daily.   Yes [provider]  gabapentin (NEURONTIN) 300 MG capsule Take 600 mg by mouth 3 (three) times daily. For nerve irratation    Yes [provider]  hydroxypropyl methylcellulose / hypromellose (ISOPTO TEARS / GONIOVISC) 2.5 % ophthalmic solution Place 1 drop into both eyes as needed for dry eyes.    Yes [provider]  hydroxyurea (HYDREA) 500 MG capsule Take 500 mg by mouth at bedtime. May take with food to minimize GI side effects.   Yes [provider]  ipratropium (ATROVENT) 0.06 % nasal spray Place 2 sprays into both nostrils every 12 (twelve) hours.   Yes [provider]  mometasone (ASMANEX 60 METERED DOSES) 220 MCG/INH inhaler Inhale 2 puffs into the lungs daily.   Yes [provider]  pantoprazole (PROTONIX) 40 MG tablet Take 40 mg by mouth 2 (two) times daily.   Yes [provider]  terazosin (HYTRIN) 10 MG capsule Take 10 mg by mouth at bedtime.   Yes [provider]  tiotropium (SPIRIVA) 18 MCG inhalation capsule Place 18  mcg into inhaler and inhale daily.   Yes [provider]  traMADol (ULTRAM) 50 MG tablet Take 50 mg by mouth every 12 (twelve) hours as needed for moderate pain.    Yes [provider]    Family History  Problem Relation Age of Onset  . Arthritis Brother      Social History  Substance Use Topics  . Smoking status: Former Smoker    Types: Cigarettes  . Smokeless tobacco: Never Used  . Alcohol use No    Allergies as of 04/30/2017 - Review Complete 04/30/2017  Allergen Reaction Noted  . Molds & smuts  09/25/2015    Review of Systems:    All systems reviewed and negative except where noted in HPI.    Physical Exam:  Vital signs in last 24 hours: Temp:  [97.8 F (36.6 C)-100 F (37.8 C)] 97.8 F (36.6 C) (09/22 1453) Pulse Rate:  [64-114] 64 (09/22 1453) Resp:  [11-21] 18 (09/22 0418) BP: (114-148)/(60-88) 148/60 (09/22 1453) SpO2:  [88 %-99 %] 95 % (09/22 0230) Weight:  [126 lb (57.2 kg)-136 lb 1.6 oz (61.7 kg)] 136 lb 1.6 oz (61.7 kg) (09/22 0418) Last BM Date: 05/01/17 General:   Pleasant, cooperative in NAD Head:  Normocephalic and atraumatic. Eyes:   No icterus.   Conjunctiva pink. PERRLA. Ears:  Normal auditory acuity. Neck:  Supple; no masses or thyroidomegaly Lungs: Respirations even and unlabored. Lungs clear to auscultation bilaterally.   No wheezes, crackles, or rhonchi.  Heart:  Regular rate and rhythm;  Without murmur, clicks, rubs or gallops Abdomen:  Soft, nondistended, nontender. Normal bowel sounds. No appreciable masses or hepatomegaly.  No rebound or guarding.  Rectal:  Not performed. Neurologic:  Alert and oriented x3;  grossly normal neurologically. Skin:  Intact without significant lesions or rashes. Cervical Nodes:  No significant cervical adenopathy. Psych:  Alert and cooperative. Normal affect.  LAB RESULTS:  Recent Labs  04/30/17 1908  WBC 8.5  HGB 11.4*  HCT 32.8*  PLT 447*   BMET  Recent Labs  04/30/17 1908  NA 135  K 3.8  CL 104  CO2 26  GLUCOSE 139*  BUN 20  CREATININE 1.03  CALCIUM 8.3*   LFT  Recent Labs  04/30/17 1908  PROT 6.4*  ALBUMIN 3.7  AST 442*  ALT 236*  ALKPHOS 110  BILITOT 1.3*   PT/INR No results for input(s): LABPROT, INR in the last 72 hours.  STUDIES: Dg Chest 2 View  Result Date: 04/30/2017 CLINICAL DATA:  High fevers.  Urinary incontinence. EXAM: CHEST  2 VIEW COMPARISON:  09/25/2015. FINDINGS: Normal sized heart. Clear lungs. Left shoulder degenerative changes. IMPRESSION: No acute abnormality. Electronically Signed   By: Beckie Salts M.D.   On: 04/30/2017 19:37   Ct Abdomen Pelvis W  Contrast  Result Date: 04/30/2017 CLINICAL DATA:  High fever, new urinary incontinence EXAM: CT ABDOMEN AND PELVIS WITH CONTRAST TECHNIQUE: Multidetector CT imaging of the abdomen and pelvis was performed using the standard protocol following bolus administration of intravenous contrast. CONTRAST:  ISOVUE-300 IOPAMIDOL (ISOVUE-300) INJECTION 61% COMPARISON:  None. FINDINGS: Lower chest: Lung bases are clear. Hepatobiliary: No focal hepatic lesion. Mild intrahepatic duct dilatation. Common bile duct is prominent at 11 mm (image 26, series 79). Within the lumen gallbladder, there is lobular high-density material which appears to layer dependently. Difficult to tell if this high-density material or enhancing tissue. Largest rounded lesion measures 2 cm. Gallbladder is not distended. Pancreas: Pancreas is atrophic. Note  pancreatic lesion present. No duct dilatation. Spleen: Normal spleen Adrenals/urinary tract: Adrenal glands normal. There bilateral nonenhancing renal cysts. Ureters are normal. The bladder is mildly distended. No bladder lesion identified. Prostate gland does indent the base the bladder. Stomach/Bowel: Stomach, small-bowel colon are unremarkable. Vascular/Lymphatic: Abdominal aorta is normal caliber with atherosclerotic calcification. There is no retroperitoneal or periportal lymphadenopathy. No pelvic lymphadenopathy. Reproductive: Prostate Other: No free fluid. Musculoskeletal: No aggressive osseous lesion. IMPRESSION: 1. Lobular high-density lobular lesions within the gallbladder may represent formed sludge. Cannot exclude enhancing tissue (follow-up or neoplasm). Recommend RIGHT upper quadrant ultrasound to evaluate for vascular flow within the larger of these lesions. The vascular flow present within lesion, consider contrast MRI for further evaluation. Favor formed high-density sludge. 2. Mild intrahepatic and extrahepatic duct dilatation. Recommend correlation with bilirubin levels and if  elevated consider MRCP for further evaluation. 3. Atrophic pancreas appears chronic. No mass lesion. No duct dilatation. 4.  Aortic Atherosclerosis (ICD10-I70.0). Electronically Signed   By: Genevive Bi M.D.   On: 04/30/2017 23:51   Mr Abdomen Mrcp Wo Contrast  Result Date: 05/01/2017 CLINICAL DATA:  Sludge in gallbladder. Concern for choledocholithiasis. Mildly elevated bilirubin. Go EXAM: MRI ABDOMEN WITHOUT CONTRAST  (INCLUDING MRCP) TECHNIQUE: Multiplanar multisequence MR imaging of the abdomen was performed. Heavily T2-weighted images of the biliary and pancreatic ducts were obtained, and three-dimensional MRCP images were rendered by post processing. COMPARISON:  CT 04/30/2017, ultrasound 05/01/2017 FINDINGS: Exam is significantly degraded by patient respiratory motion. The MRCP images are particularly affected. Lower chest:  Lung bases are clear. Hepatobiliary: Is mild intrahepatic and extrahepatic biliary duct dilatation. There are multiple stones and sludge within the gallbladder lumen which. There is a filling defect within the proximal common bile duct measuring 8 mm (image 16, series 4) also seen on coronal image 31, series 3. More distal filling defect noted on image 16 and 17 of series 5 measuring 9 and 4 mm. There is no focal hepatic lesion. Along the coronal T2 weighted imaging (series 10) there is 11 mm filling defect in the most distal common bile duct distal to a 9 mm filling defect. This sequence appears best for evaluating these distal filling defects within common bile duct (image 7 through 10 of series 10. Pancreas: The pancreatic parenchyma appears atrophic. The pancreatic duct is not dilated. Spleen: Normal spleen. Adrenals/urinary tract: Adrenal glands normal. Multiple cysts of the kidneys appear benign. Stomach/Bowel: Stomach and limited of the small bowel is unremarkable Vascular/Lymphatic: Abdominal aortic normal caliber. No retroperitoneal periportal lymphadenopathy.  Musculoskeletal: No aggressive osseous lesion IMPRESSION: 1. Two large filling defects within the distal common bile duct most consistent choledocholithiasis. 2. Mild extra intrahepatic biliary duct dilatation. 3. Multiple stones and sludge within the gallbladder lumen. 4. Chronic atrophy of the pancreas.  No pancreatic duct dilatation. These results will be called to the ordering clinician or representative by the Radiologist Assistant, and communication documented in the PACS or zVision Dashboard. Electronically Signed   By: Genevive Bi M.D.   On: 05/01/2017 14:27   Mr 3d Recon At Scanner  Result Date: 05/01/2017 CLINICAL DATA:  Sludge in gallbladder. Concern for choledocholithiasis. Mildly elevated bilirubin. Go EXAM: MRI ABDOMEN WITHOUT CONTRAST  (INCLUDING MRCP) TECHNIQUE: Multiplanar multisequence MR imaging of the abdomen was performed. Heavily T2-weighted images of the biliary and pancreatic ducts were obtained, and three-dimensional MRCP images were rendered by post processing. COMPARISON:  CT 04/30/2017, ultrasound 05/01/2017 FINDINGS: Exam is significantly degraded by patient respiratory motion. The MRCP images are  particularly affected. Lower chest:  Lung bases are clear. Hepatobiliary: Is mild intrahepatic and extrahepatic biliary duct dilatation. There are multiple stones and sludge within the gallbladder lumen which. There is a filling defect within the proximal common bile duct measuring 8 mm (image 16, series 4) also seen on coronal image 31, series 3. More distal filling defect noted on image 16 and 17 of series 5 measuring 9 and 4 mm. There is no focal hepatic lesion. Along the coronal T2 weighted imaging (series 10) there is 11 mm filling defect in the most distal common bile duct distal to a 9 mm filling defect. This sequence appears best for evaluating these distal filling defects within common bile duct (image 7 through 10 of series 10. Pancreas: The pancreatic parenchyma appears  atrophic. The pancreatic duct is not dilated. Spleen: Normal spleen. Adrenals/urinary tract: Adrenal glands normal. Multiple cysts of the kidneys appear benign. Stomach/Bowel: Stomach and limited of the small bowel is unremarkable Vascular/Lymphatic: Abdominal aortic normal caliber. No retroperitoneal periportal lymphadenopathy. Musculoskeletal: No aggressive osseous lesion IMPRESSION: 1. Two large filling defects within the distal common bile duct most consistent choledocholithiasis. 2. Mild extra intrahepatic biliary duct dilatation. 3. Multiple stones and sludge within the gallbladder lumen. 4. Chronic atrophy of the pancreas.  No pancreatic duct dilatation. These results will be called to the ordering clinician or representative by the Radiologist Assistant, and communication documented in the PACS or zVision Dashboard. Electronically Signed   By: Genevive Bi M.D.   On: 05/01/2017 14:27   US Abdomen Limited Ruq  Result Date: 05/01/2017 CLINICAL DATA:  Elevated liver function studies. EXAM: ULTRASOUND ABDOMEN LIMITED RIGHT UPPER QUADRANT COMPARISON:  CT abdomen and pelvis 04/30/2017 FINDINGS: Gallbladder: Multiple stones and sludge demonstrated in the gallbladder. Largest stone measures about 2 cm diameter. No flow is demonstrated within the intraluminal contents. Gallbladder is mildly distended. No wall thickening or edema. Common bile duct: Diameter: 9.2 mm. This represents mild extrahepatic bile duct dilatation. No obstructing stone is demonstrated but the distal common bile duct is obscured by bowel gas. Review of previous CT shows increased density in the distal common bile duct likely representing common duct stones. Mild intrahepatic bile duct dilatation is also demonstrated. Liver: No focal lesion identified. Within normal limits in parenchymal echogenicity. Portal vein is patent on color Doppler imaging with normal direction of blood flow towards the liver. Incidental note of a simple appearing  cyst in the right kidney measuring 6.4 cm maximal diameter. IMPRESSION: 1. Cholelithiasis with multiple stones and sludge in the gallbladder. No additional changes to suggest cholecystitis. 2. Dilated intra and extrahepatic bile ducts. Probable choledocholithiasis shown at previous CT. The distal bile duct is not seen on ultrasound due to overlying bowel gas. 3. Benign-appearing cyst in the right kidney. Electronically Signed   By: Burman Nieves M.D.   On: 05/01/2017 03:53      Impression / Plan:   ERRIK MITCHELLE is a 81 y.o. y/o male with choledocholithiasis and possible cholangitis . On admission was confused. Presentl on antibiotics, afebrile and not confused     Plan  1. ERCP is needed. Patient was not NPO and was drinking while I went in hence not possible today at this time . If shows signs of sepsis - would require later today . Dr Servando Snare is not available tomorrow and if doing well will plan for Monday . If he is unwell tomorrow will need transfer out .   I have discussed alternative options, risks &  benefits,  which include, but are not limited to, bleeding, infection, perforation,respiratory complication & drug reaction.  The patient agrees with this plan & written consent will be obtained.    Thank you for involving me in the care of this patient.      LOS: 0 days   [name], MD  05/01/2017, 3:23 PM

## 2017-05-02 DIAGNOSIS — K8031 Calculus of bile duct with cholangitis, unspecified, with obstruction: Secondary | ICD-10-CM

## 2017-05-02 DIAGNOSIS — K8033 Calculus of bile duct with acute cholangitis with obstruction: Secondary | ICD-10-CM

## 2017-05-02 DIAGNOSIS — K8071 Calculus of gallbladder and bile duct without cholecystitis with obstruction: Secondary | ICD-10-CM

## 2017-05-02 LAB — PROTIME-INR
INR: 1.36
Prothrombin Time: 16.7 seconds — ABNORMAL HIGH (ref 11.4–15.2)

## 2017-05-02 LAB — COMPREHENSIVE METABOLIC PANEL
ALBUMIN: 3.4 g/dL — AB (ref 3.5–5.0)
ALK PHOS: 111 U/L (ref 38–126)
ALT: 141 U/L — AB (ref 17–63)
AST: 101 U/L — AB (ref 15–41)
Anion gap: 6 (ref 5–15)
BUN: 11 mg/dL (ref 6–20)
CALCIUM: 8.5 mg/dL — AB (ref 8.9–10.3)
CHLORIDE: 110 mmol/L (ref 101–111)
CO2: 27 mmol/L (ref 22–32)
CREATININE: 0.71 mg/dL (ref 0.61–1.24)
GFR calc non Af Amer: >60 mL/min (ref >60)
GLUCOSE: 84 mg/dL (ref 65–99)
Potassium: 3.1 mmol/L — ABNORMAL LOW (ref 3.5–5.1)
SODIUM: 143 mmol/L (ref 135–145)
Total Bilirubin: 1.1 mg/dL (ref 0.3–1.2)
Total Protein: 6.4 g/dL — ABNORMAL LOW (ref 6.5–8.1)

## 2017-05-02 NOTE — Progress Notes (Signed)
[employer] [facility] [place] [facility]                                                                                                                                                                                  Patient Demographics   Jack Rios, is a 81 y.o. male, DOB - 12-27-1935, VVO:[id number]  Admit date - 04/30/2017   Admitting Physician Arnaldo Natal, MD  Outpatient Primary MD for the patient is System, Pcp Not In   LOS - 1  Subjective: No further fever. MRCP consistent with choledocholithiasis    Review of Systems:   CONSTITUTIONAL: No documented fever. No fatigue, weakness. No weight gain, no weight loss.  EYES: No blurry or double vision.  ENT: No tinnitus. No postnasal drip. No redness of the oropharynx.  RESPIRATORY: No cough, no wheeze, no hemoptysis. No dyspnea.  CARDIOVASCULAR: No chest pain. No orthopnea. No palpitations. No syncope.  GASTROINTESTINAL: No nausea, no vomiting or diarrhea. No abdominal pain. No melena or hematochezia.  GENITOURINARY: No dysuria or hematuria.  ENDOCRINE: No polyuria or nocturia. No heat or cold intolerance.  HEMATOLOGY: No anemia. No bruising. No bleeding.  INTEGUMENTARY: No rashes. No lesions.  MUSCULOSKELETAL: No arthritis. No swelling. No gout.  NEUROLOGIC: No numbness, tingling, or ataxia. No seizure-type activity.  PSYCHIATRIC: No anxiety. No insomnia. No ADD.    Vitals:   Vitals:   05/01/17 1958 05/02/17 0500 05/02/17 0541 05/02/17 1220  BP: (!) 144/82  (!) 155/94 120/79  Pulse: 76  84 85  Resp: Temp: 97.9 F (36.6 C)  97.9 F (36.6 C) 98.6 F (37 C)  TempSrc: Oral  Oral   SpO2: 97%  100% 98%  Weight:  135 lb 9.6 oz (61.5 kg)    Height:        Wt Readings from Last 3 Encounters:  05/02/17 135 lb 9.6 oz (61.5 kg)  09/26/15 126 lb 14.4 oz (57.6 kg)     Intake/Output Summary (Last 24 hours) at 05/02/17 1251 Last data filed at 05/02/17 1211  Gross per 24 hour  Intake           1482.08 ml  Output             1126 ml  Net           356.08 ml    Physical Exam:   GENERAL: Pleasant-appearing in no apparent distress.  HEAD, EYES, EARS, NOSE AND THROAT: Atraumatic, normocephalic. Extraocular muscles are intact. Pupils equal and reactive to light. Sclerae anicteric. No conjunctival injection. No oro-pharyngeal erythema.  NECK: Supple. There is no jugular venous distention. No bruits, no lymphadenopathy, no thyromegaly.  HEART: Regular rate and rhythm,. No murmurs, no rubs, no clicks.  LUNGS: Clear to auscultation bilaterally. No rales or rhonchi. No wheezes.  ABDOMEN: Soft, flat, nontender, nondistended. Has good bowel sounds. No hepatosplenomegaly appreciated.  EXTREMITIES: No evidence of any cyanosis, clubbing, or peripheral edema.  +2 pedal and radial pulses bilaterally.  NEUROLOGIC: The patient is alert, awake, and oriented x3 with no focal motor or sensory deficits appreciated bilaterally.  SKIN: Moist and warm with no rashes appreciated.  Psych: Not anxious, depressed LN: No inguinal LN enlargement    Antibiotics   Anti-infectives    Start     Dose/Rate Route Frequency Ordered Stop   05/01/17 0930  piperacillin-tazobactam (ZOSYN) IVPB 3.375 g     3.375 g 12.5 mL/hr over 240 Minutes Intravenous Every 8 hours 05/01/17 0817        Medications   Scheduled Meds: . aspirin  81 mg Oral Daily  . budesonide (PULMICORT) nebulizer solution  0.25 mg Nebulization BID  . diphenhydrAMINE  25 mg Oral Q6H  . docusate sodium  100 mg Oral BID  . enoxaparin (LOVENOX) injection  40 mg Subcutaneous Q24H  . ferrous sulfate  325 mg Oral QPM  . finasteride  5 mg Oral Daily  . fluticasone  2 spray Each Nare BID  . gabapentin  600 mg Oral TID  . hydroxyurea  500 mg Oral QHS  . Influenza vac split quadrivalent PF  0.5 mL Intramuscular Tomorrow-1000  . ipratropium  2 spray Each Nare Q12H  . pantoprazole  40 mg Oral BID  . terazosin  10 mg Oral QHS  . tiotropium  18 mcg  Inhalation Daily   Continuous Infusions: . sodium chloride 50 mL/hr at 05/02/17 0654  . piperacillin-tazobactam (ZOSYN)  IV Stopped (05/02/17 1141)   PRN Meds:.acetaminophen **OR** acetaminophen, LORazepam, ondansetron **OR** ondansetron (ZOFRAN) IV, polyvinyl alcohol, traMADol   Data Review:   Micro Results Recent Results (from the past 240 hour(s))  Culture, blood (routine x 2)     Status: None (Preliminary result)   Collection Time: 05/01/17  1:09 AM  Result Value Ref Range Status   Specimen Description BLOOD RIGHT ANTECUBITAL  Final   Special Requests   Final    BOTTLES DRAWN AEROBIC AND ANAEROBIC Blood Culture results may not be optimal due to an excessive volume of blood received in culture bottles   Culture NO GROWTH 1 DAY  Final   Report Status PENDING  Incomplete  Culture, blood (routine x 2)     Status: None (Preliminary result)   Collection Time: 05/01/17  1:09 AM  Result Value Ref Range Status   Specimen Description BLOOD RIGHT ANTECUBITAL  Final   Special Requests   Final    BOTTLES DRAWN AEROBIC AND ANAEROBIC Blood Culture adequate volume   Culture NO GROWTH 1 DAY  Final   Report Status PENDING  Incomplete    Radiology Reports Dg Chest 2 View  Result Date: 04/30/2017 CLINICAL DATA:  High fevers.  Urinary incontinence. EXAM: CHEST  2 VIEW COMPARISON:  09/25/2015. FINDINGS: Normal sized heart. Clear lungs. Left shoulder degenerative changes. IMPRESSION: No acute abnormality. Electronically Signed   By: Beckie Salts M.D.   On: 04/30/2017 19:37   Ct Abdomen Pelvis W Contrast  Result Date: 04/30/2017 CLINICAL DATA:  High fever, new urinary incontinence EXAM: CT ABDOMEN AND PELVIS WITH CONTRAST TECHNIQUE: Multidetector CT imaging of the abdomen and pelvis was performed using the standard protocol following bolus administration of intravenous contrast. CONTRAST:   ISOVUE-300 IOPAMIDOL (ISOVUE-300) INJECTION 61% COMPARISON:  None. FINDINGS: Lower chest: Lung bases are  clear. Hepatobiliary: No focal hepatic lesion. Mild intrahepatic duct dilatation. Common bile duct is prominent at 11 mm (image 26, series 79). Within the lumen gallbladder, there is lobular high-density material which appears to layer dependently. Difficult to tell if this high-density material or enhancing tissue. Largest rounded lesion measures 2 cm. Gallbladder is not distended. Pancreas: Pancreas is atrophic. Note pancreatic lesion present. No duct dilatation. Spleen: Normal spleen Adrenals/urinary tract: Adrenal glands normal. There bilateral nonenhancing renal cysts. Ureters are normal. The bladder is mildly distended. No bladder lesion identified. Prostate gland does indent the base the bladder. Stomach/Bowel: Stomach, small-bowel colon are unremarkable. Vascular/Lymphatic: Abdominal aorta is normal caliber with atherosclerotic calcification. There is no retroperitoneal or periportal lymphadenopathy. No pelvic lymphadenopathy. Reproductive: Prostate Other: No free fluid. Musculoskeletal: No aggressive osseous lesion. IMPRESSION: 1. Lobular high-density lobular lesions within the gallbladder may represent formed sludge. Cannot exclude enhancing tissue (follow-up or neoplasm). Recommend RIGHT upper quadrant ultrasound to evaluate for vascular flow within the larger of these lesions. The vascular flow present within lesion, consider contrast MRI for further evaluation. Favor formed high-density sludge. 2. Mild intrahepatic and extrahepatic duct dilatation. Recommend correlation with bilirubin levels and if elevated consider MRCP for further evaluation. 3. Atrophic pancreas appears chronic. No mass lesion. No duct dilatation. 4.  Aortic Atherosclerosis (ICD10-I70.0). Electronically Signed   By: Genevive Bi M.D.   On: 04/30/2017 23:51   Mr Abdomen Mrcp Wo Contrast  Result Date: 05/01/2017 CLINICAL DATA:  Sludge in gallbladder. Concern for choledocholithiasis. Mildly elevated bilirubin. Go EXAM: MRI  ABDOMEN WITHOUT CONTRAST  (INCLUDING MRCP) TECHNIQUE: Multiplanar multisequence MR imaging of the abdomen was performed. Heavily T2-weighted images of the biliary and pancreatic ducts were obtained, and three-dimensional MRCP images were rendered by post processing. COMPARISON:  CT 04/30/2017, ultrasound 05/01/2017 FINDINGS: Exam is significantly degraded by patient respiratory motion. The MRCP images are particularly affected. Lower chest:  Lung bases are clear. Hepatobiliary: Is mild intrahepatic and extrahepatic biliary duct dilatation. There are multiple stones and sludge within the gallbladder lumen which. There is a filling defect within the proximal common bile duct measuring 8 mm (image 16, series 4) also seen on coronal image 31, series 3. More distal filling defect noted on image 16 and 17 of series 5 measuring 9 and 4 mm. There is no focal hepatic lesion. Along the coronal T2 weighted imaging (series 10) there is 11 mm filling defect in the most distal common bile duct distal to a 9 mm filling defect. This sequence appears best for evaluating these distal filling defects within common bile duct (image 7 through 10 of series 10. Pancreas: The pancreatic parenchyma appears atrophic. The pancreatic duct is not dilated. Spleen: Normal spleen. Adrenals/urinary tract: Adrenal glands normal. Multiple cysts of the kidneys appear benign. Stomach/Bowel: Stomach and limited of the small bowel is unremarkable Vascular/Lymphatic: Abdominal aortic normal caliber. No retroperitoneal periportal lymphadenopathy. Musculoskeletal: No aggressive osseous lesion IMPRESSION: 1. Two large filling defects within the distal common bile duct most consistent choledocholithiasis. 2. Mild extra intrahepatic biliary duct dilatation. 3. Multiple stones and sludge within the gallbladder lumen. 4. Chronic atrophy of the pancreas.  No pancreatic duct dilatation. These results will be called to the ordering clinician or representative by  the Radiologist Assistant, and communication documented in the PACS or zVision Dashboard. Electronically Signed   By: Genevive Bi M.D.   On: 05/01/2017 14:27   Mr 3d Recon  At Scanner  Result Date: 05/01/2017 CLINICAL DATA:  Sludge in gallbladder. Concern for choledocholithiasis. Mildly elevated bilirubin. Go EXAM: MRI ABDOMEN WITHOUT CONTRAST  (INCLUDING MRCP) TECHNIQUE: Multiplanar multisequence MR imaging of the abdomen was performed. Heavily T2-weighted images of the biliary and pancreatic ducts were obtained, and three-dimensional MRCP images were rendered by post processing. COMPARISON:  CT 04/30/2017, ultrasound 05/01/2017 FINDINGS: Exam is significantly degraded by patient respiratory motion. The MRCP images are particularly affected. Lower chest:  Lung bases are clear. Hepatobiliary: Is mild intrahepatic and extrahepatic biliary duct dilatation. There are multiple stones and sludge within the gallbladder lumen which. There is a filling defect within the proximal common bile duct measuring 8 mm (image 16, series 4) also seen on coronal image 31, series 3. More distal filling defect noted on image 16 and 17 of series 5 measuring 9 and 4 mm. There is no focal hepatic lesion. Along the coronal T2 weighted imaging (series 10) there is 11 mm filling defect in the most distal common bile duct distal to a 9 mm filling defect. This sequence appears best for evaluating these distal filling defects within common bile duct (image 7 through 10 of series 10. Pancreas: The pancreatic parenchyma appears atrophic. The pancreatic duct is not dilated. Spleen: Normal spleen. Adrenals/urinary tract: Adrenal glands normal. Multiple cysts of the kidneys appear benign. Stomach/Bowel: Stomach and limited of the small bowel is unremarkable Vascular/Lymphatic: Abdominal aortic normal caliber. No retroperitoneal periportal lymphadenopathy. Musculoskeletal: No aggressive osseous lesion IMPRESSION: 1. Two large filling defects  within the distal common bile duct most consistent choledocholithiasis. 2. Mild extra intrahepatic biliary duct dilatation. 3. Multiple stones and sludge within the gallbladder lumen. 4. Chronic atrophy of the pancreas.  No pancreatic duct dilatation. These results will be called to the ordering clinician or representative by the Radiologist Assistant, and communication documented in the PACS or zVision Dashboard. Electronically Signed   By: Genevive Bi M.D.   On: 05/01/2017 14:27   US Abdomen Limited Ruq  Result Date: 05/01/2017 CLINICAL DATA:  Elevated liver function studies. EXAM: ULTRASOUND ABDOMEN LIMITED RIGHT UPPER QUADRANT COMPARISON:  CT abdomen and pelvis 04/30/2017 FINDINGS: Gallbladder: Multiple stones and sludge demonstrated in the gallbladder. Largest stone measures about 2 cm diameter. No flow is demonstrated within the intraluminal contents. Gallbladder is mildly distended. No wall thickening or edema. Common bile duct: Diameter: 9.2 mm. This represents mild extrahepatic bile duct dilatation. No obstructing stone is demonstrated but the distal common bile duct is obscured by bowel gas. Review of previous CT shows increased density in the distal common bile duct likely representing common duct stones. Mild intrahepatic bile duct dilatation is also demonstrated. Liver: No focal lesion identified. Within normal limits in parenchymal echogenicity. Portal vein is patent on color Doppler imaging with normal direction of blood flow towards the liver. Incidental note of a simple appearing cyst in the right kidney measuring 6.4 cm maximal diameter. IMPRESSION: 1. Cholelithiasis with multiple stones and sludge in the gallbladder. No additional changes to suggest cholecystitis. 2. Dilated intra and extrahepatic bile ducts. Probable choledocholithiasis shown at previous CT. The distal bile duct is not seen on ultrasound due to overlying bowel gas. 3. Benign-appearing cyst in the right kidney.  Electronically Signed   By: Burman Nieves M.D.   On: 05/01/2017 03:53     CBC  Recent Labs Lab 04/30/17 1908  WBC 8.5  HGB 11.4*  HCT 32.8*  PLT 447*  MCV 108.1*  MCH 37.5*  MCHC 34.7  RDW  15.8*  LYMPHSABS 0.1*  MONOABS 0.6  EOSABS 0.0  BASOSABS 0.1    Chemistries   Recent Labs Lab 04/30/17 1908 05/02/17 0623  NA 135 143  K 3.8 3.1*  CL 104 110  CO2 26 27  GLUCOSE 139* 84  BUN 20 11  CREATININE 1.03 0.71  CALCIUM 8.3* 8.5*  AST 442* 101*  ALT 236* 141*  ALKPHOS 110 111  BILITOT 1.3* 1.1   ------------------------------------------------------------------------------------------------------------------ estimated creatinine clearance is 65.1 mL/min (by C-G formula based on SCr of 0.71 mg/dL). ------------------------------------------------------------------------------------------------------------------ No results for input(s): HGBA1C in the last 72 hours. ------------------------------------------------------------------------------------------------------------------ No results for input(s): CHOL, HDL, LDLCALC, TRIG, CHOLHDL, LDLDIRECT in the last 72 hours. ------------------------------------------------------------------------------------------------------------------  Recent Labs  04/30/17 2052  TSH 0.656   ------------------------------------------------------------------------------------------------------------------ No results for input(s): VITAMINB12, FOLATE, FERRITIN, TIBC, IRON, RETICCTPCT in the last 72 hours.  Coagulation profile  Recent Labs Lab 05/02/17 0925  INR 1.36    No results for input(s): DDIMER in the last 72 hours.  Cardiac Enzymes  Recent Labs Lab 04/30/17 1908  TROPONINI <0.03   ------------------------------------------------------------------------------------------------------------------ Invalid input(s): POCBNP    Assessment & Plan   1.  Fever:  Likely related to intra-abdominal infection  continue IV  Zosyn,blood cultures negative 2. Choldeocholelithasis due to gallstones Plan for ERCP tomorrow morning continue clear liquids nothing by mouth past midnight Patient needs surgical evaluation for lap cholecystectomy I will ask surgery to see in preparation for this 3. Asthma/COPD: Continue inhaled corticosteroid, Spiriva and albuterol. 4. BPH: Continue Proscar and Terazosin 5. DVT prophylaxis: Lovenox 6. GI prophylaxis: Pantoprazole      Code Status Orders        Start     Ordered   05/01/17 0424  Full code  Continuous     05/01/17 0423    Code Status History    Date Active Date Inactive Code Status Order ID Comments User Context   09/26/2015 12:32 AM 09/26/2015  6:08 PM Full Code [id number]  Ihor Austin, MD ED           Consults gi   DVT Prophylaxis  Lovenox   Lab Results  Component Value Date   PLT 447 (H) 04/30/2017     Time Spent in minutes   Greater than 50% of time spent in care coordination and counseling patient regarding the condition and plan of care.   Auburn Bilberry M.D on 05/02/2017 at 12:51 PM  Between 7am to 6pm - Pager - [phone]  After 6pm go to [email or web address] - password [email or web address] [facility]  [facility]   Office  [phone]

## 2017-05-02 NOTE — Consult Note (Addendum)
SURGICAL CONSULTATION NOTE (initial) - cpt: [id number]  HISTORY OF PRESENT ILLNESS (HPI):  81 y.o. somewhat foregetful male presented to [facility] ED 2 days ago for evaluation of fever to 102.7 with chills, lethargy, and altered mental status. Workup was found to be significant for transaminitis and CT, ultrasound, and MRCP suggesting choledocholithiasis. IV antibiotics were started, patient was admitted to medical service, his mental state has improved, and he is scheduled to undergo ERCP tomorrow. Patient and his wife also report he has has lost 60 lbs over the past year due to fatty foods intolerance with loose BM's and has been evaluated at a [place] hospital over the past 6 - 7 months for Right abdominal pain and underwent colonoscopy and endoscopy, the latter with which they describe as "a procedure to make something bigger to make something pass more easily" (?ERCP with sphincterotomy) after which he was told his physicians will have to "watch his gallbladder". 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  Surgery is consulted by GI physician Dr. Tobi Bastos medical physician Dr. Allena Katz in this context for evaluation and surgical management of cholelithiasis with choledocholithiasis and ascending cholangitis.  PAST MEDICAL HISTORY (PMH):  Past Medical History:  Diagnosis Date  . Arthritis   . Asthma   . Lung nodule   . Neuropathy   . Peptic ulcer      PAST SURGICAL HISTORY (PSH):  Past Surgical History:  Procedure Laterality Date  . CERVICAL SPINE SURGERY       MEDICATIONS:  Prior to Admission medications   Medication Sig Start Date End Date Taking? Authorizing Provider  albuterol (PROVENTIL HFA;VENTOLIN HFA) 108 (90 Base) MCG/ACT inhaler Inhale 2 puffs into the lungs every 4 (four) hours as needed for wheezing or shortness of breath.   Yes [provider]  aspirin 81 MG chewable tablet Chew 81 mg by mouth daily.   Yes [provider]  chlorpheniramine (CHLOR-TRIMETON) 4 MG tablet Take 4 mg by mouth at bedtime.   Yes [provider]  docusate sodium (COLACE) 100 MG capsule Take 100 mg by mouth daily.    Yes [provider]  ferrous sulfate 325 (65 FE) MG tablet Take 325 mg by mouth every evening.    Yes [provider]  finasteride (PROSCAR) 5 MG tablet Take 5 mg by mouth daily.   Yes [provider]  fluticasone (FLONASE) 50 MCG/ACT nasal spray Place 2 sprays into both nostrils 2 (two) times daily.   Yes [provider]  gabapentin (NEURONTIN) 300 MG capsule Take 600 mg by mouth 3 (three) times daily. For nerve irratation    Yes [provider]  hydroxypropyl methylcellulose / hypromellose (ISOPTO TEARS / GONIOVISC) 2.5 % ophthalmic solution Place 1 drop into both eyes as needed for dry eyes.    Yes [provider]  hydroxyurea (HYDREA) 500 MG capsule Take 500 mg by mouth at bedtime. May take with food to minimize GI side effects.   Yes [provider]  ipratropium (ATROVENT) 0.06 % nasal spray Place 2 sprays into both nostrils every 12 (twelve) hours.   Yes [provider]  mometasone (ASMANEX 60 METERED DOSES) 220 MCG/INH inhaler Inhale 2 puffs into the lungs daily.   Yes [provider]  pantoprazole (PROTONIX) 40 MG tablet Take 40 mg by mouth 2 (two) times daily.  Yes [provider]  terazosin (HYTRIN) 10 MG capsule Take 10 mg by mouth at bedtime.   Yes [provider]  tiotropium (SPIRIVA) 18 MCG inhalation capsule Place 18 mcg into inhaler and inhale daily.   Yes [provider]  traMADol (ULTRAM) 50 MG tablet Take 50 mg by mouth every 12 (twelve) hours as needed for moderate pain.    Yes [provider]     ALLERGIES:  Allergies  Allergen Reactions  . Molds & Smuts      SOCIAL HISTORY:  Social  History   Social History  . Marital status: Married    Spouse name: N/A  . Number of children: N/A  . Years of education: N/A   Occupational History  . works at [employer].    Social History Main Topics  . Smoking status: Former Smoker    Types: Cigarettes  . Smokeless tobacco: Never Used  . Alcohol use No  . Drug use: No  . Sexual activity: Not on file   Other Topics Concern  . Not on file   Social History Narrative   Retired and lives with wife.       The patient currently resides (home / rehab facility / nursing home): Home The patient normally is (ambulatory / bedbound): Ambulatory, though slowly due to back issues   FAMILY HISTORY:  Family History  Problem Relation Age of Onset  . Arthritis Brother      REVIEW OF SYSTEMS:  Constitutional: denies weight loss, fever, chills, or sweats  Eyes: denies any other vision changes, history of eye injury  ENT: denies sore throat, hearing problems  Respiratory: denies shortness of breath, wheezing  Cardiovascular: denies chest pain, palpitations  Gastrointestinal: abdominal pain, N/V, and bowel function as per HPI Genitourinary: denies burning with urination or urinary frequency Musculoskeletal: denies any other joint pains or cramps  Skin: denies any other rashes or skin discolorations  Neurological: denies any other headache, dizziness, weakness  Psychiatric: denies any other depression, anxiety   All other review of systems were negative   VITAL SIGNS:  Temp:  [97.8 F (36.6 C)-97.9 F (36.6 C)] 97.9 F (36.6 C) (09/23 0541) Pulse Rate:  [64-84] 84 (09/23 0541) Resp:  [18-20] 20 (09/23 0541) BP: (144-155)/(60-94) 155/94 (09/23 0541) SpO2:  [95 %-100 %] 100 % (09/23 0541) Weight:  [135 lb 9.6 oz (61.5 kg)] 135 lb 9.6 oz (61.5 kg) (09/23 0500)     Height:  (172.7 cm) Weight: 135 lb 9.6 oz (61.5 kg) BMI (Calculated): 20.62   INTAKE/OUTPUT:  This shift: Total I/O In: -  Out: 200 [Urine:200]  Last 2  shifts: @   PHYSICAL EXAM:  Constitutional:  -- Normal body habitus  -- Awake, alert, and oriented x3  Eyes:  -- Pupils equally round and reactive to light  -- No scleral icterus  Ear, nose, and throat:  -- No jugular venous distension  Pulmonary:  -- No crackles  -- Equal breath sounds bilaterally -- Breathing non-labored at rest Cardiovascular:  -- S1, S2 present  -- No pericardial rubs Gastrointestinal:  -- Abdomen soft, nontender, non-distended, no guarding or rebound tenderness -- No abdominal masses appreciated, pulsatile or otherwise  Musculoskeletal and Integumentary:  -- Wounds or skin discoloration: None appreciated -- Extremities: B/L UE and LE FROM, hands and feet warm, no edema  Neurologic:  -- Motor function: intact and symmetric -- Sensation: intact and symmetric  Labs:  CBC Latest Ref Rng & Units 04/30/2017 09/26/2015 09/26/2015  WBC 3.8 - 10.6 K/uL 8.5 12.0(H) 11.5(H)  Hemoglobin 13.0 - 18.0 g/dL 11.4(L) 10.1(L) 10.5(L)  Hematocrit 40.0 - 52.0 % 32.8(L) 31.0(L) 32.1(L)  Platelets 150 - 440 K/uL 447(H) 593(H) 626(H)   CMP Latest Ref Rng & Units 05/02/2017 04/30/2017 09/26/2015  Glucose 65 - 99 mg/dL 84 161(W) 960(A)  BUN 6 - 20 mg/dL Creatinine 0.61 - 1.24 mg/dL 5.40 9.81 1.91  Sodium 135 - 145 mmol/L 143 135 139  Potassium 3.5 - 5.1 mmol/L 3.1(L) 3.8 3.9  Chloride 101 - 111 mmol/L 110 104 108  CO2 22 - 32 mmol/L Calcium 8.9 - 10.3 mg/dL 4.7(W) 8.3(L) 7.7(L)  Total Protein 6.5 - 8.1 g/dL 6.4(L) 6.4(L) -  Total Bilirubin 0.3 - 1.2 mg/dL 1.1 2.9(F) -  Alkaline Phos 38 - 126 U/L 111 110 -  AST 15 - 41 U/L 101(H) 442(H) -  ALT 17 - 63 U/L 141(H) 236(H) -   Imaging studies:  MRCP (05/01/2017) - personally reviewed and discussed with patient 1. Two large filling defects within the distal common bile duct most consistent choledocholithiasis. 2. Mild extra intrahepatic biliary duct dilatation. 3. Multiple stones and sludge  within the gallbladder lumen. 4. Chronic atrophy of the pancreas.  No pancreatic duct dilatation.  Limited RUQ Ultrasound (05/01/2017) Gallbladder: Multiple stones and sludge demonstrated in the gallbladder. Largest stone measures about 2 cm diameter. No flow is demonstrated within the intraluminal contents. Gallbladder is mildly distended. No wall thickening or edema.  Common bile duct: Diameter: 9.2 mm. This represents mild extrahepatic bile duct dilatation. No obstructing stone is demonstrated but the distal common bile duct is obscured by bowel gas. Review of previous CT shows increased density in the distal common bile duct likely representing common duct stones. Mild intrahepatic bile duct dilatation is also demonstrated.  CT Abdomen and Pelvis with Contrast (04/30/2017) 1. Lobular high-density lobular lesions within the gallbladder may represent formed sludge. Cannot exclude enhancing tissue (follow-up or neoplasm). Recommend RIGHT upper quadrant ultrasound to evaluate for vascular flow within the larger of these lesions. The vascular flow present within lesion, consider contrast MRI for further evaluation. Favor formed high-density sludge. 2. Mild intrahepatic and extrahepatic duct dilatation. Recommend correlation with bilirubin levels and if elevated consider MRCP for further evaluation. 3. Atrophic pancreas appears chronic. No mass lesion. No duct dilatation. 4.  Aortic Atherosclerosis (ICD10-I70.0).  Assessment/Plan: (ICD-10's: K80.71, K80.33) 81 y.o. male with cholelithiasis, complicated by choledocholithiasis and ascending cholangitis as well as by pertinent comorbidities including advanced age, asthma, lung nodule, GERD with history of PUD, and osteoarthritis including cervical spine with neuropathy.   - follow-up ERCP tomorrow  - continue IV antibiotics per primary team  - medical risk stratification, optimization, and management of comorbidities per medical  team  - anticipate laparoscopic cholecystectomy prior to discharge if safe for surgery  - ambulation encouraged, DVT prophylaxis  All of the above findings and recommendations were discussed with the patient and his wife (bedside), and all of patient's and his family's questions were answered to their expressed satisfaction.  Thank you for the opportunity to participate in this patient's care.   -- Scherrie Gerlach Earlene Plater, MD, RPVI [place]: [facility] - Partnering for exceptional care. Office: [phone]

## 2017-05-02 NOTE — Progress Notes (Signed)
[name] MD, MRCP(U.K) [address]  [address]  [place], [place] [address]  Main: [phone]    Jack Rios is being followed for cholangitis  Day 1 of follow up   Subjective: No pain , fever or discomfort    Objective: Vital signs in last 24 hours: Vitals:   05/01/17 1938 05/01/17 1958 05/02/17 0500 05/02/17 0541  BP:  (!) 144/82  (!) 155/94  Pulse:  76  84  Resp:  18  20  Temp:  97.9 F (36.6 C)  97.9 F (36.6 C)  TempSrc:  Oral  Oral  SpO2: 95% 97%  100%  Weight:   135 lb 9.6 oz (61.5 kg)   Height:       Weight change: 9 lb 9.6 oz (4.355 kg)  Intake/Output Summary (Last 24 hours) at 05/02/17 0901 Last data filed at 05/02/17 0541  Gross per 24 hour  Intake          1482.08 ml  Output              925 ml  Net           557.08 ml     Exam: Heart:: Regular rate and rhythm, S1S2 present or without murmur or extra heart sounds Lungs: normal, clear to auscultation and clear to auscultation and percussion Abdomen: soft, nontender, normal bowel sounds   Lab Results: @ Micro Results: Recent Results (from the past 240 hour(s))  Culture, blood (routine x 2)     Status: None (Preliminary result)   Collection Time: 05/01/17  1:09 AM  Result Value Ref Range Status   Specimen Description BLOOD RIGHT ANTECUBITAL  Final   Special Requests   Final    BOTTLES DRAWN AEROBIC AND ANAEROBIC Blood Culture results may not be optimal due to an excessive volume of blood received in culture bottles   Culture NO GROWTH < 12 HOURS  Final   Report Status PENDING  Incomplete  Culture, blood (routine x 2)     Status: None (Preliminary result)   Collection Time: 05/01/17  1:09 AM  Result Value Ref Range Status   Specimen Description BLOOD RIGHT ANTECUBITAL  Final   Special Requests   Final    BOTTLES DRAWN AEROBIC AND ANAEROBIC Blood Culture adequate volume   Culture NO GROWTH < 12 HOURS  Final   Report Status PENDING  Incomplete   Studies/Results: Dg  Chest 2 View  Result Date: 04/30/2017 CLINICAL DATA:  High fevers.  Urinary incontinence. EXAM: CHEST  2 VIEW COMPARISON:  09/25/2015. FINDINGS: Normal sized heart. Clear lungs. Left shoulder degenerative changes. IMPRESSION: No acute abnormality. Electronically Signed   By: Beckie Salts M.D.   On: 04/30/2017 19:37   Ct Abdomen Pelvis W Contrast  Result Date: 04/30/2017 CLINICAL DATA:  High fever, new urinary incontinence EXAM: CT ABDOMEN AND PELVIS WITH CONTRAST TECHNIQUE: Multidetector CT imaging of the abdomen and pelvis was performed using the standard protocol following bolus administration of intravenous contrast. CONTRAST:  ISOVUE-300 IOPAMIDOL (ISOVUE-300) INJECTION 61% COMPARISON:  None. FINDINGS: Lower chest: Lung bases are clear. Hepatobiliary: No focal hepatic lesion. Mild intrahepatic duct dilatation. Common bile duct is prominent at 11 mm (image 26, series 79). Within the lumen gallbladder, there is lobular high-density material which appears to layer dependently. Difficult to tell if this high-density material or enhancing tissue. Largest rounded lesion measures 2 cm. Gallbladder is not distended. Pancreas: Pancreas is atrophic. Note pancreatic lesion present. No duct dilatation. Spleen: Normal  spleen Adrenals/urinary tract: Adrenal glands normal. There bilateral nonenhancing renal cysts. Ureters are normal. The bladder is mildly distended. No bladder lesion identified. Prostate gland does indent the base the bladder. Stomach/Bowel: Stomach, small-bowel colon are unremarkable. Vascular/Lymphatic: Abdominal aorta is normal caliber with atherosclerotic calcification. There is no retroperitoneal or periportal lymphadenopathy. No pelvic lymphadenopathy. Reproductive: Prostate Other: No free fluid. Musculoskeletal: No aggressive osseous lesion. IMPRESSION: 1. Lobular high-density lobular lesions within the gallbladder may represent formed sludge. Cannot exclude enhancing tissue (follow-up or  neoplasm). Recommend RIGHT upper quadrant ultrasound to evaluate for vascular flow within the larger of these lesions. The vascular flow present within lesion, consider contrast MRI for further evaluation. Favor formed high-density sludge. 2. Mild intrahepatic and extrahepatic duct dilatation. Recommend correlation with bilirubin levels and if elevated consider MRCP for further evaluation. 3. Atrophic pancreas appears chronic. No mass lesion. No duct dilatation. 4.  Aortic Atherosclerosis (ICD10-I70.0). Electronically Signed   By: Genevive Bi M.D.   On: 04/30/2017 23:51   Mr Abdomen Mrcp Wo Contrast  Result Date: 05/01/2017 CLINICAL DATA:  Sludge in gallbladder. Concern for choledocholithiasis. Mildly elevated bilirubin. Go EXAM: MRI ABDOMEN WITHOUT CONTRAST  (INCLUDING MRCP) TECHNIQUE: Multiplanar multisequence MR imaging of the abdomen was performed. Heavily T2-weighted images of the biliary and pancreatic ducts were obtained, and three-dimensional MRCP images were rendered by post processing. COMPARISON:  CT 04/30/2017, ultrasound 05/01/2017 FINDINGS: Exam is significantly degraded by patient respiratory motion. The MRCP images are particularly affected. Lower chest:  Lung bases are clear. Hepatobiliary: Is mild intrahepatic and extrahepatic biliary duct dilatation. There are multiple stones and sludge within the gallbladder lumen which. There is a filling defect within the proximal common bile duct measuring 8 mm (image 16, series 4) also seen on coronal image 31, series 3. More distal filling defect noted on image 16 and 17 of series 5 measuring 9 and 4 mm. There is no focal hepatic lesion. Along the coronal T2 weighted imaging (series 10) there is 11 mm filling defect in the most distal common bile duct distal to a 9 mm filling defect. This sequence appears best for evaluating these distal filling defects within common bile duct (image 7 through 10 of series 10. Pancreas: The pancreatic parenchyma  appears atrophic. The pancreatic duct is not dilated. Spleen: Normal spleen. Adrenals/urinary tract: Adrenal glands normal. Multiple cysts of the kidneys appear benign. Stomach/Bowel: Stomach and limited of the small bowel is unremarkable Vascular/Lymphatic: Abdominal aortic normal caliber. No retroperitoneal periportal lymphadenopathy. Musculoskeletal: No aggressive osseous lesion IMPRESSION: 1. Two large filling defects within the distal common bile duct most consistent choledocholithiasis. 2. Mild extra intrahepatic biliary duct dilatation. 3. Multiple stones and sludge within the gallbladder lumen. 4. Chronic atrophy of the pancreas.  No pancreatic duct dilatation. These results will be called to the ordering clinician or representative by the Radiologist Assistant, and communication documented in the PACS or zVision Dashboard. Electronically Signed   By: Genevive Bi M.D.   On: 05/01/2017 14:27   Mr 3d Recon At Scanner  Result Date: 05/01/2017 CLINICAL DATA:  Sludge in gallbladder. Concern for choledocholithiasis. Mildly elevated bilirubin. Go EXAM: MRI ABDOMEN WITHOUT CONTRAST  (INCLUDING MRCP) TECHNIQUE: Multiplanar multisequence MR imaging of the abdomen was performed. Heavily T2-weighted images of the biliary and pancreatic ducts were obtained, and three-dimensional MRCP images were rendered by post processing. COMPARISON:  CT 04/30/2017, ultrasound 05/01/2017 FINDINGS: Exam is significantly degraded by patient respiratory motion. The MRCP images are particularly affected. Lower chest:  Lung bases are  clear. Hepatobiliary: Is mild intrahepatic and extrahepatic biliary duct dilatation. There are multiple stones and sludge within the gallbladder lumen which. There is a filling defect within the proximal common bile duct measuring 8 mm (image 16, series 4) also seen on coronal image 31, series 3. More distal filling defect noted on image 16 and 17 of series 5 measuring 9 and 4 mm. There is no focal  hepatic lesion. Along the coronal T2 weighted imaging (series 10) there is 11 mm filling defect in the most distal common bile duct distal to a 9 mm filling defect. This sequence appears best for evaluating these distal filling defects within common bile duct (image 7 through 10 of series 10. Pancreas: The pancreatic parenchyma appears atrophic. The pancreatic duct is not dilated. Spleen: Normal spleen. Adrenals/urinary tract: Adrenal glands normal. Multiple cysts of the kidneys appear benign. Stomach/Bowel: Stomach and limited of the small bowel is unremarkable Vascular/Lymphatic: Abdominal aortic normal caliber. No retroperitoneal periportal lymphadenopathy. Musculoskeletal: No aggressive osseous lesion IMPRESSION: 1. Two large filling defects within the distal common bile duct most consistent choledocholithiasis. 2. Mild extra intrahepatic biliary duct dilatation. 3. Multiple stones and sludge within the gallbladder lumen. 4. Chronic atrophy of the pancreas.  No pancreatic duct dilatation. These results will be called to the ordering clinician or representative by the Radiologist Assistant, and communication documented in the PACS or zVision Dashboard. Electronically Signed   By: Genevive Bi M.D.   On: 05/01/2017 14:27   US Abdomen Limited Ruq  Result Date: 05/01/2017 CLINICAL DATA:  Elevated liver function studies. EXAM: ULTRASOUND ABDOMEN LIMITED RIGHT UPPER QUADRANT COMPARISON:  CT abdomen and pelvis 04/30/2017 FINDINGS: Gallbladder: Multiple stones and sludge demonstrated in the gallbladder. Largest stone measures about 2 cm diameter. No flow is demonstrated within the intraluminal contents. Gallbladder is mildly distended. No wall thickening or edema. Common bile duct: Diameter: 9.2 mm. This represents mild extrahepatic bile duct dilatation. No obstructing stone is demonstrated but the distal common bile duct is obscured by bowel gas. Review of previous CT shows increased density in the distal  common bile duct likely representing common duct stones. Mild intrahepatic bile duct dilatation is also demonstrated. Liver: No focal lesion identified. Within normal limits in parenchymal echogenicity. Portal vein is patent on color Doppler imaging with normal direction of blood flow towards the liver. Incidental note of a simple appearing cyst in the right kidney measuring 6.4 cm maximal diameter. IMPRESSION: 1. Cholelithiasis with multiple stones and sludge in the gallbladder. No additional changes to suggest cholecystitis. 2. Dilated intra and extrahepatic bile ducts. Probable choledocholithiasis shown at previous CT. The distal bile duct is not seen on ultrasound due to overlying bowel gas. 3. Benign-appearing cyst in the right kidney. Electronically Signed   By: Burman Nieves M.D.   On: 05/01/2017 03:53   Medications: I have reviewed the patient's current medications. Scheduled Meds: . aspirin  81 mg Oral Daily  . budesonide (PULMICORT) nebulizer solution  0.25 mg Nebulization BID  . diphenhydrAMINE  25 mg Oral Q6H  . docusate sodium  100 mg Oral BID  . enoxaparin (LOVENOX) injection  40 mg Subcutaneous Q24H  . ferrous sulfate  325 mg Oral QPM  . finasteride  5 mg Oral Daily  . fluticasone  2 spray Each Nare BID  . gabapentin  600 mg Oral TID  . hydroxyurea  500 mg Oral QHS  . Influenza vac split quadrivalent PF  0.5 mL Intramuscular Tomorrow-1000  . ipratropium  2 spray Each  Nare Q12H  . pantoprazole  40 mg Oral BID  . terazosin  10 mg Oral QHS  . tiotropium  18 mcg Inhalation Daily   Continuous Infusions: . sodium chloride 50 mL/hr at 05/02/17 0654  . piperacillin-tazobactam (ZOSYN)  IV 3.375 g (05/02/17 0830)   PRN Meds:.acetaminophen **OR** acetaminophen, LORazepam, ondansetron **OR** ondansetron (ZOFRAN) IV, polyvinyl alcohol, traMADol   Assessment: Active Problems:   Transaminitis  Jack Rios is a 81 y.o. y/o male with choledocholithiasis and possible cholangitis .  On admission was confused. Presently on antibiotics, afebrile and not confused  . Bilirubin normal today    Plan  1. ERCP tomorrow. In the interim if shows signs of SIRS/SEPSIS may need to be transferred as Dr Servando Snare is not available today    2 . NPO from midnight , check INR   LOS: 1 day   [name] 05/02/2017, 9:01 AM

## 2017-05-02 NOTE — Progress Notes (Signed)
Patient doing well. Not in any form of distress. Patient did not spike a fever, vital signs remained stable, and mental status remains at baseline. Rest and comforts maintained. Needs attended, kept safe and comfortable.

## 2017-05-03 ENCOUNTER — Encounter
Admission: EM | Disposition: A | Discharge status: Home / Self Care | Payer: Self-pay | Source: Home / Self Care | Attending: Internal Medicine

## 2017-05-03 ENCOUNTER — Inpatient Hospital Stay: Payer: Medicare Other | Admitting: Anesthesiology

## 2017-05-03 ENCOUNTER — Inpatient Hospital Stay: Payer: Medicare Other

## 2017-05-03 DIAGNOSIS — K8050 Calculus of bile duct without cholangitis or cholecystitis without obstruction: Secondary | ICD-10-CM

## 2017-05-03 DIAGNOSIS — K315 Obstruction of duodenum: Secondary | ICD-10-CM

## 2017-05-03 DIAGNOSIS — R17 Unspecified jaundice: Secondary | ICD-10-CM

## 2017-05-03 DIAGNOSIS — K838 Other specified diseases of biliary tract: Secondary | ICD-10-CM

## 2017-05-03 DIAGNOSIS — R509 Fever, unspecified: Secondary | ICD-10-CM

## 2017-05-03 HISTORY — PX: ENDOSCOPIC RETROGRADE CHOLANGIOPANCREATOGRAPHY (ERCP) WITH PROPOFOL: SHX5810

## 2017-05-03 LAB — CBC
HCT: 32.2 % — ABNORMAL LOW (ref 40.0–52.0)
Hemoglobin: 11.4 g/dL — ABNORMAL LOW (ref 13.0–18.0)
MCH: 37 pg — AB (ref 26.0–34.0)
MCHC: 35.4 g/dL (ref 32.0–36.0)
MCV: 104.5 fL — ABNORMAL HIGH (ref 80.0–100.0)
PLATELETS: 515 10*3/uL — AB (ref 150–440)
RBC: 3.09 MIL/uL — AB (ref 4.40–5.90)
RDW: 15.5 % — ABNORMAL HIGH (ref 11.5–14.5)
WBC: 6.9 10*3/uL (ref 3.8–10.6)

## 2017-05-03 LAB — HEPATIC FUNCTION PANEL
ALT: 100 U/L — AB (ref 17–63)
AST: 54 U/L — ABNORMAL HIGH (ref 15–41)
Albumin: 3.1 g/dL — ABNORMAL LOW (ref 3.5–5.0)
Alkaline Phosphatase: 88 U/L (ref 38–126)
BILIRUBIN INDIRECT: 0.9 mg/dL (ref 0.3–0.9)
Bilirubin, Direct: 0.2 mg/dL (ref 0.1–0.5)
TOTAL PROTEIN: 5.8 g/dL — AB (ref 6.5–8.1)
Total Bilirubin: 1.1 mg/dL (ref 0.3–1.2)

## 2017-05-03 SURGERY — ENDOSCOPIC RETROGRADE CHOLANGIOPANCREATOGRAPHY (ERCP) WITH PROPOFOL
Anesthesia: General

## 2017-05-03 MED ORDER — PROPOFOL 500 MG/50ML IV EMUL
INTRAVENOUS | Status: DC | PRN
  Administered 2017-05-03: 100 ug/kg/min via INTRAVENOUS

## 2017-05-03 MED ORDER — PROPOFOL 10 MG/ML IV BOLUS
INTRAVENOUS | Status: DC | PRN
  Administered 2017-05-03: 100 mg via INTRAVENOUS

## 2017-05-03 MED ORDER — INDOMETHACIN 50 MG RE SUPP
100.0000 mg | Freq: Once | RECTAL | Status: DC
  Filled 2017-05-03: qty 2

## 2017-05-03 MED ORDER — INDOMETHACIN 50 MG RE SUPP
RECTAL | Status: AC
  Filled 2017-05-03: qty 2

## 2017-05-03 MED ORDER — SODIUM CHLORIDE 0.9 % IV SOLN
INTRAVENOUS | Status: DC | PRN
  Administered 2017-05-03: 12:00:00 via INTRAVENOUS

## 2017-05-03 MED ORDER — POTASSIUM CHLORIDE 20 MEQ PO PACK
40.0000 meq | PACK | Freq: Once | ORAL | Status: AC
  Administered 2017-05-03: 17:00:00 40 meq via ORAL
  Filled 2017-05-03: qty 2

## 2017-05-03 MED ORDER — SODIUM CHLORIDE 0.9 % IV SOLN: INTRAVENOUS | Status: DC

## 2017-05-03 MED ORDER — PROPOFOL 500 MG/50ML IV EMUL
INTRAVENOUS | Status: AC
  Filled 2017-05-03: qty 50

## 2017-05-03 MED ORDER — ALBUTEROL SULFATE (2.5 MG/3ML) 0.083% IN NEBU: 2.5000 mg | INHALATION_SOLUTION | Freq: Four times a day (QID) | RESPIRATORY_TRACT | Status: DC | PRN

## 2017-05-03 NOTE — Progress Notes (Signed)
[employer] - [place] at [facility]                                                                                                                                                                                  Patient Demographics   Jack Rios, is a 81 y.o. male, DOB - [birth year], WUJ:[id number]  Admit date - 04/30/2017   Admitting Physician Arnaldo Natal, MD  Outpatient Primary MD for the patient is System, Pcp Not In   LOS - 2  Subjective: Patient awaiting MRCP denies any abdominal pain    Review of Systems:   CONSTITUTIONAL: No documented fever. No fatigue, weakness. No weight gain, no weight loss.  EYES: No blurry or double vision.  ENT: No tinnitus. No postnasal drip. No redness of the oropharynx.  RESPIRATORY: No cough, no wheeze, no hemoptysis. No dyspnea.  CARDIOVASCULAR: No chest pain. No orthopnea. No palpitations. No syncope.  GASTROINTESTINAL: No nausea, no vomiting or diarrhea. No abdominal pain. No melena or hematochezia.  GENITOURINARY: No dysuria or hematuria.  ENDOCRINE: No polyuria or nocturia. No heat or cold intolerance.  HEMATOLOGY: No anemia. No bruising. No bleeding.  INTEGUMENTARY: No rashes. No lesions.  MUSCULOSKELETAL: No arthritis. No swelling. No gout.  NEUROLOGIC: No numbness, tingling, or ataxia. No seizure-type activity.  PSYCHIATRIC: No anxiety. No insomnia. No ADD.    Vitals:   Vitals:   05/03/17 1315 05/03/17 1325 05/03/17 1345 05/03/17 1403  BP:  (!) 145/84 (!) 147/88 (!) 153/77  Pulse:  71  62  Resp: Temp: (!) 96.4 F (35.8 C)   (!) 97.4 F (36.3 C)  TempSrc: Tympanic   Oral  SpO2:    100%  Weight:      Height:        Wt Readings from Last 3 Encounters:  05/03/17 133 lb 6.4 oz (60.5 kg)  09/26/15 126 lb 14.4 oz (57.6 kg)     Intake/Output Summary (Last 24 hours) at 05/03/17 1418 Last data filed at 05/03/17 1310  Gross per 24 hour  Intake             2660 ml  Output              650 ml   Net             2010 ml    Physical Exam:   GENERAL: Pleasant-appearing in no apparent distress.  HEAD, EYES, EARS, NOSE AND THROAT: Atraumatic, normocephalic. Extraocular muscles are intact. Pupils equal and reactive to light. Sclerae anicteric. No conjunctival injection. No oro-pharyngeal erythema.  NECK: Supple. There is no jugular venous distention. No bruits, no lymphadenopathy, no  thyromegaly.  HEART: Regular rate and rhythm,. No murmurs, no rubs, no clicks.  LUNGS: Clear to auscultation bilaterally. No rales or rhonchi. No wheezes.  ABDOMEN: Soft, flat, nontender, nondistended. Has good bowel sounds. No hepatosplenomegaly appreciated.  EXTREMITIES: No evidence of any cyanosis, clubbing, or peripheral edema.  +2 pedal and radial pulses bilaterally.  NEUROLOGIC: The patient is alert, awake, and oriented x3 with no focal motor or sensory deficits appreciated bilaterally.  SKIN: Moist and warm with no rashes appreciated.  Psych: Not anxious, depressed LN: No inguinal LN enlargement    Antibiotics   Anti-infectives    Start     Dose/Rate Route Frequency Ordered Stop   05/01/17 0930  piperacillin-tazobactam (ZOSYN) IVPB 3.375 g     3.375 g 12.5 mL/hr over 240 Minutes Intravenous Every 8 hours 05/01/17 0817        Medications   Scheduled Meds: . aspirin  81 mg Oral Daily  . budesonide (PULMICORT) nebulizer solution  0.25 mg Nebulization BID  . diphenhydrAMINE  25 mg Oral Q6H  . docusate sodium  100 mg Oral BID  . enoxaparin (LOVENOX) injection  40 mg Subcutaneous Q24H  . ferrous sulfate  325 mg Oral QPM  . finasteride  5 mg Oral Daily  . fluticasone  2 spray Each Nare BID  . gabapentin  600 mg Oral TID  . hydroxyurea  500 mg Oral QHS  . indomethacin      . Influenza vac split quadrivalent PF  0.5 mL Intramuscular Tomorrow-1000  . ipratropium  2 spray Each Nare Q12H  . pantoprazole  40 mg Oral BID  . potassium chloride  40 mEq Oral Once  . terazosin  10 mg Oral QHS  .  tiotropium  18 mcg Inhalation Daily   Continuous Infusions: . sodium chloride 50 mL/hr at 05/02/17 0654  . piperacillin-tazobactam (ZOSYN)  IV Stopped (05/03/17 0547)   PRN Meds:.acetaminophen **OR** acetaminophen, albuterol, LORazepam, ondansetron **OR** ondansetron (ZOFRAN) IV, polyvinyl alcohol, traMADol   Data Review:   Micro Results Recent Results (from the past 240 hour(s))  Culture, blood (routine x 2)     Status: None (Preliminary result)   Collection Time: 05/01/17  1:09 AM  Result Value Ref Range Status   Specimen Description BLOOD RIGHT ANTECUBITAL  Final   Special Requests   Final    BOTTLES DRAWN AEROBIC AND ANAEROBIC Blood Culture results may not be optimal due to an excessive volume of blood received in culture bottles   Culture NO GROWTH 2 DAYS  Final   Report Status PENDING  Incomplete  Culture, blood (routine x 2)     Status: None (Preliminary result)   Collection Time: 05/01/17  1:09 AM  Result Value Ref Range Status   Specimen Description BLOOD RIGHT ANTECUBITAL  Final   Special Requests   Final    BOTTLES DRAWN AEROBIC AND ANAEROBIC Blood Culture adequate volume   Culture NO GROWTH 2 DAYS  Final   Report Status PENDING  Incomplete    Radiology Reports Dg Chest 2 View  Result Date: 04/30/2017 CLINICAL DATA:  High fevers.  Urinary incontinence. EXAM: CHEST  2 VIEW COMPARISON:  09/25/2015. FINDINGS: Normal sized heart. Clear lungs. Left shoulder degenerative changes. IMPRESSION: No acute abnormality. Electronically Signed   By: Beckie Salts M.D.   On: 04/30/2017 19:37   Ct Abdomen Pelvis W Contrast  Result Date: 04/30/2017 CLINICAL DATA:  High fever, new urinary incontinence EXAM: CT ABDOMEN AND PELVIS WITH CONTRAST TECHNIQUE: Multidetector CT imaging of  the abdomen and pelvis was performed using the standard protocol following bolus administration of intravenous contrast. CONTRAST:  ISOVUE-300 IOPAMIDOL (ISOVUE-300) INJECTION 61% COMPARISON:  None.  FINDINGS: Lower chest: Lung bases are clear. Hepatobiliary: No focal hepatic lesion. Mild intrahepatic duct dilatation. Common bile duct is prominent at 11 mm (image 26, series 79). Within the lumen gallbladder, there is lobular high-density material which appears to layer dependently. Difficult to tell if this high-density material or enhancing tissue. Largest rounded lesion measures 2 cm. Gallbladder is not distended. Pancreas: Pancreas is atrophic. Note pancreatic lesion present. No duct dilatation. Spleen: Normal spleen Adrenals/urinary tract: Adrenal glands normal. There bilateral nonenhancing renal cysts. Ureters are normal. The bladder is mildly distended. No bladder lesion identified. Prostate gland does indent the base the bladder. Stomach/Bowel: Stomach, small-bowel colon are unremarkable. Vascular/Lymphatic: Abdominal aorta is normal caliber with atherosclerotic calcification. There is no retroperitoneal or periportal lymphadenopathy. No pelvic lymphadenopathy. Reproductive: Prostate Other: No free fluid. Musculoskeletal: No aggressive osseous lesion. IMPRESSION: 1. Lobular high-density lobular lesions within the gallbladder may represent formed sludge. Cannot exclude enhancing tissue (follow-up or neoplasm). Recommend RIGHT upper quadrant ultrasound to evaluate for vascular flow within the larger of these lesions. The vascular flow present within lesion, consider contrast MRI for further evaluation. Favor formed high-density sludge. 2. Mild intrahepatic and extrahepatic duct dilatation. Recommend correlation with bilirubin levels and if elevated consider MRCP for further evaluation. 3. Atrophic pancreas appears chronic. No mass lesion. No duct dilatation. 4.  Aortic Atherosclerosis (ICD10-I70.0). Electronically Signed   By: Genevive Bi M.D.   On: 04/30/2017 23:51   Mr Abdomen Mrcp Wo Contrast  Result Date: 05/01/2017 CLINICAL DATA:  Sludge in gallbladder. Concern for choledocholithiasis. Mildly  elevated bilirubin. Go EXAM: MRI ABDOMEN WITHOUT CONTRAST  (INCLUDING MRCP) TECHNIQUE: Multiplanar multisequence MR imaging of the abdomen was performed. Heavily T2-weighted images of the biliary and pancreatic ducts were obtained, and three-dimensional MRCP images were rendered by post processing. COMPARISON:  CT 04/30/2017, ultrasound 05/01/2017 FINDINGS: Exam is significantly degraded by patient respiratory motion. The MRCP images are particularly affected. Lower chest:  Lung bases are clear. Hepatobiliary: Is mild intrahepatic and extrahepatic biliary duct dilatation. There are multiple stones and sludge within the gallbladder lumen which. There is a filling defect within the proximal common bile duct measuring 8 mm (image 16, series 4) also seen on coronal image 31, series 3. More distal filling defect noted on image 16 and 17 of series 5 measuring 9 and 4 mm. There is no focal hepatic lesion. Along the coronal T2 weighted imaging (series 10) there is 11 mm filling defect in the most distal common bile duct distal to a 9 mm filling defect. This sequence appears best for evaluating these distal filling defects within common bile duct (image 7 through 10 of series 10. Pancreas: The pancreatic parenchyma appears atrophic. The pancreatic duct is not dilated. Spleen: Normal spleen. Adrenals/urinary tract: Adrenal glands normal. Multiple cysts of the kidneys appear benign. Stomach/Bowel: Stomach and limited of the small bowel is unremarkable Vascular/Lymphatic: Abdominal aortic normal caliber. No retroperitoneal periportal lymphadenopathy. Musculoskeletal: No aggressive osseous lesion IMPRESSION: 1. Two large filling defects within the distal common bile duct most consistent choledocholithiasis. 2. Mild extra intrahepatic biliary duct dilatation. 3. Multiple stones and sludge within the gallbladder lumen. 4. Chronic atrophy of the pancreas.  No pancreatic duct dilatation. These results will be called to the ordering  clinician or representative by the Radiologist Assistant, and communication documented in the PACS or zVision Dashboard.  Electronically Signed   By: Genevive Bi M.D.   On: 05/01/2017 14:27   Mr 3d Recon At Scanner  Result Date: 05/01/2017 CLINICAL DATA:  Sludge in gallbladder. Concern for choledocholithiasis. Mildly elevated bilirubin. Go EXAM: MRI ABDOMEN WITHOUT CONTRAST  (INCLUDING MRCP) TECHNIQUE: Multiplanar multisequence MR imaging of the abdomen was performed. Heavily T2-weighted images of the biliary and pancreatic ducts were obtained, and three-dimensional MRCP images were rendered by post processing. COMPARISON:  CT 04/30/2017, ultrasound 05/01/2017 FINDINGS: Exam is significantly degraded by patient respiratory motion. The MRCP images are particularly affected. Lower chest:  Lung bases are clear. Hepatobiliary: Is mild intrahepatic and extrahepatic biliary duct dilatation. There are multiple stones and sludge within the gallbladder lumen which. There is a filling defect within the proximal common bile duct measuring 8 mm (image 16, series 4) also seen on coronal image 31, series 3. More distal filling defect noted on image 16 and 17 of series 5 measuring 9 and 4 mm. There is no focal hepatic lesion. Along the coronal T2 weighted imaging (series 10) there is 11 mm filling defect in the most distal common bile duct distal to a 9 mm filling defect. This sequence appears best for evaluating these distal filling defects within common bile duct (image 7 through 10 of series 10. Pancreas: The pancreatic parenchyma appears atrophic. The pancreatic duct is not dilated. Spleen: Normal spleen. Adrenals/urinary tract: Adrenal glands normal. Multiple cysts of the kidneys appear benign. Stomach/Bowel: Stomach and limited of the small bowel is unremarkable Vascular/Lymphatic: Abdominal aortic normal caliber. No retroperitoneal periportal lymphadenopathy. Musculoskeletal: No aggressive osseous lesion IMPRESSION:  1. Two large filling defects within the distal common bile duct most consistent choledocholithiasis. 2. Mild extra intrahepatic biliary duct dilatation. 3. Multiple stones and sludge within the gallbladder lumen. 4. Chronic atrophy of the pancreas.  No pancreatic duct dilatation. These results will be called to the ordering clinician or representative by the Radiologist Assistant, and communication documented in the PACS or zVision Dashboard. Electronically Signed   By: Genevive Bi M.D.   On: 05/01/2017 14:27   Dg C-arm 1-60 Min-no Report  Result Date: 05/03/2017 Fluoroscopy was utilized by the requesting physician.  No radiographic interpretation.   US Abdomen Limited Ruq  Result Date: 05/01/2017 CLINICAL DATA:  Elevated liver function studies. EXAM: ULTRASOUND ABDOMEN LIMITED RIGHT UPPER QUADRANT COMPARISON:  CT abdomen and pelvis 04/30/2017 FINDINGS: Gallbladder: Multiple stones and sludge demonstrated in the gallbladder. Largest stone measures about 2 cm diameter. No flow is demonstrated within the intraluminal contents. Gallbladder is mildly distended. No wall thickening or edema. Common bile duct: Diameter: 9.2 mm. This represents mild extrahepatic bile duct dilatation. No obstructing stone is demonstrated but the distal common bile duct is obscured by bowel gas. Review of previous CT shows increased density in the distal common bile duct likely representing common duct stones. Mild intrahepatic bile duct dilatation is also demonstrated. Liver: No focal lesion identified. Within normal limits in parenchymal echogenicity. Portal vein is patent on color Doppler imaging with normal direction of blood flow towards the liver. Incidental note of a simple appearing cyst in the right kidney measuring 6.4 cm maximal diameter. IMPRESSION: 1. Cholelithiasis with multiple stones and sludge in the gallbladder. No additional changes to suggest cholecystitis. 2. Dilated intra and extrahepatic bile ducts.  Probable choledocholithiasis shown at previous CT. The distal bile duct is not seen on ultrasound due to overlying bowel gas. 3. Benign-appearing cyst in the right kidney. Electronically Signed   By: Chrissie Noa  Andria Meuse M.D.   On: 05/01/2017 03:53     CBC  Recent Labs Lab 04/30/17 1908 05/03/17 0327  WBC 8.5 6.9  HGB 11.4* 11.4*  HCT 32.8* 32.2*  PLT 447* 515*  MCV 108.1* 104.5*  MCH 37.5* 37.0*  MCHC 34.7 35.4  RDW 15.8* 15.5*  LYMPHSABS 0.1*  --   MONOABS 0.6  --   EOSABS 0.0  --   BASOSABS 0.1  --     Chemistries   Recent Labs Lab 04/30/17 1908 05/02/17 0623 05/03/17 0327  NA 135 143  --   K 3.8 3.1*  --   CL 104 110  --   CO2 26 27  --   GLUCOSE 139* 84  --   BUN 20 11  --   CREATININE 1.03 0.71  --   CALCIUM 8.3* 8.5*  --   AST 442* 101* 54*  ALT 236* 141* 100*  ALKPHOS 110 111 88  BILITOT 1.3* 1.1 1.1   ------------------------------------------------------------------------------------------------------------------ estimated creatinine clearance is 64.1 mL/min (by C-G formula based on SCr of 0.71 mg/dL). ------------------------------------------------------------------------------------------------------------------ No results for input(s): HGBA1C in the last 72 hours. ------------------------------------------------------------------------------------------------------------------ No results for input(s): CHOL, HDL, LDLCALC, TRIG, CHOLHDL, LDLDIRECT in the last 72 hours. ------------------------------------------------------------------------------------------------------------------  Recent Labs  04/30/17 2052  TSH 0.656   ------------------------------------------------------------------------------------------------------------------ No results for input(s): VITAMINB12, FOLATE, FERRITIN, TIBC, IRON, RETICCTPCT in the last 72 hours.  Coagulation profile  Recent Labs Lab 05/02/17 0925  INR 1.36    No results for input(s): DDIMER in the last 72  hours.  Cardiac Enzymes  Recent Labs Lab 04/30/17 1908  TROPONINI <0.03   ------------------------------------------------------------------------------------------------------------------ Invalid input(s): POCBNP    Assessment & Plan   1.  Fever Due Ascending cholangitis:  Likely related to intra-abdominal infection  continue IV Zosyn,blood cultures negative so far.  2. Choldeocholelithasis due to gallstones ERCP today 3. Asthma/COPD: Continue inhaled corticosteroid, Spiriva and albuterol. 4. BPH: Continue Proscar and Terazosin 5. DVT prophylaxis: Lovenox 6. GI prophylaxis: Pantoprazole      Code Status Orders        Start     Ordered   05/01/17 0424  Full code  Continuous     05/01/17 0423    Code Status History    Date Active Date Inactive Code Status Order ID Comments User Context   09/26/2015 12:32 AM 09/26/2015  6:08 PM Full Code [id number]  Ihor Austin, MD ED           Consults gi   DVT Prophylaxis  Lovenox   Lab Results  Component Value Date   PLT 515 (H) 05/03/2017     Time Spent in minutes   Greater than 50% of time spent in care coordination and counseling patient regarding the condition and plan of care.   Auburn Bilberry M.D on 05/03/2017 at 2:18 PM  Between 7am to 6pm - Pager - [phone]  After 6pm go to [email or web address] - password [email or web address] [facility]  [facility]   Office  [phone]

## 2017-05-03 NOTE — Op Note (Signed)
[facility] Gastroenterology Patient Name: Jack Rios Procedure Date: 05/03/2017 12:16 PM MRN: [id number] Account #: [id number] Date of Birth: 1936/02/19 Admit Type: Inpatient Age: 81 Room: [facility] ENDO ROOM 4 Gender: Male Note Status: Finalized Procedure:            ERCP Indications:          Suspected ascending cholangitis, Jaundice Providers:            Midge Minium MD, MD Referring MD:         No Local Md, MD (Referring MD) Medicines:            Propofol per Anesthesia Complications:        No immediate complications. Procedure:            Pre-Anesthesia Assessment:                       - Prior to the procedure, a History and Physical was                        performed, and patient medications and allergies were                        reviewed. The patient's tolerance of previous                        anesthesia was also reviewed. The risks and benefits of                        the procedure and the sedation options and risks were                        discussed with the patient. All questions were                        answered, and informed consent was obtained. Prior                        Anticoagulants: The patient has taken no previous                        anticoagulant or antiplatelet agents. ASA Grade                        Assessment: II - A patient with mild systemic disease.                        After reviewing the risks and benefits, the patient was                        deemed in satisfactory condition to undergo the                        procedure.                       After obtaining informed consent, the scope was passed                        under direct vision. Throughout the procedure, the  patient's blood pressure, pulse, and oxygen saturations                        were monitored continuously. The Endosonoscope was                        introduced through the mouth, and used to inject          contrast into and used to inject contrast into the bile                        duct. The ERCP was accomplished without difficulty. The                        patient tolerated the procedure well. Findings:      The scout film was normal. The major papilla was located entirely within       a diverticulum. The bile duct was deeply cannulated with the short-nosed       traction sphincterotome. Contrast was injected. I personally interpreted       the bile duct images. There was brisk flow of contrast through the       ducts. Image quality was excellent. Contrast extended to the hepatic       ducts. The lower third of the main bile duct contained multiple stones       mm. The main bile duct was dilated. The upper GI tract was traversed       under direct vision without detailed examination. An acquired       benign-appearing, intrinsic moderate stenosis was found in the second       portion of the duodenum and was traversed after dilation. Dilation of       major papilla with a 12-13.5-15 mm balloon (to a maximum balloon size of       12 mm) dilator was successful. The biliary tree was swept with a 15 mm       balloon starting at the bifurcation. All stones were removed. A TTS       dilator was passed through the scope. Dilation with an 06-21-12 mm       pyloric balloon dilator was performed in the second portion of the       duodenum. Impression:           - The major papilla was located entirely within a                        diverticulum.                       - Acquired duodenal stenosis.                       - Dilation performed in the second portion of the                        duodenum.                       - The entire main bile duct was dilated.                       - Choledocholithiasis was found. Complete removal was  accomplished by balloon extraction.                       - Major papilla was successfully dilated.                       - The  biliary tree was swept. Recommendation:       - Watch for pancreatitis, bleeding, perforation, and                        cholangitis. Procedure Code(s):    --- Professional ---                       [phone], 59, Endoscopic retrograde                        cholangiopancreatography (ERCP); with trans-endoscopic                        balloon dilation of biliary/pancreatic duct(s) or of                        ampulla (sphincteroplasty), including sphincterotomy,                        when performed, each duct                       43264, Endoscopic retrograde cholangiopancreatography                        (ERCP); with removal of calculi/debris from                        biliary/pancreatic duct(s)                       43245, Esophagogastroduodenoscopy, flexible, transoral;                        with dilation of gastric/duodenal stricture(s) (eg,                        balloon, bougie)                       78469, Endoscopic catheterization of the biliary ductal                        system, radiological supervision and interpretation Diagnosis Code(s):    --- Professional ---                       R17, Unspecified jaundice                       K80.50, Calculus of bile duct without cholangitis or                        cholecystitis without obstruction                       K83.8, Other specified diseases of biliary tract  K31.5, Obstruction of duodenum CPT copyright 2016 [employer]. All rights reserved. The codes documented in this report are preliminary and upon coder review may  be revised to meet current compliance requirements. Midge Minium MD, MD 05/03/2017 1:15:25 PM This report has been signed electronically. Number of Addenda: 0 Note Initiated On: 05/03/2017 12:16 PM      [facility]

## 2017-05-03 NOTE — Anesthesia Post-op Follow-up Note (Signed)
Anesthesia QCDR form completed.        

## 2017-05-03 NOTE — Progress Notes (Signed)
Patient kept NPO after midnight for ERCP today.  Kept safe and comfortable. Needs attended.

## 2017-05-03 NOTE — Progress Notes (Signed)
CC: Cholangitis Subjective: 81 year old male admitted with a working diagnosis of cholangitis. Patient reports that he has been feeling better since admission. Denies any current abdominal pain. Family brought records of what appears to be an endoscopy and ERCP from the [place] from earlier in the summer. There is no date on the record of the ERCP. Patient states that he understands the plan of ERCP today and possible cholecystectomy later in the week.  Objective: Vital signs in last 24 hours: Temp:  [97.9 F (36.6 C)-98.6 F (37 C)] 98.2 F (36.8 C) (09/24 0847) Pulse Rate:  [75-85] 76 (09/24 0847) Resp:  [18-20] 18 (09/24 0847) BP: (120-150)/(79-92) 136/92 (09/24 0847) SpO2:  [97 %-100 %] 97 % (09/24 0847) Weight:  [60.5 kg (133 lb 6.4 oz)] 60.5 kg (133 lb 6.4 oz) (09/24 0500) Last BM Date: 05/02/17  Intake/Output from previous day: 09/23 0701 - 09/24 0700 In: 2160 [P.O.:760; I.V.:1250; IV Piggyback:150] Out: 851 [Urine:850; Stool:1] Intake/Output this shift: No intake/output data recorded.  Physical exam:  Gen.: No acute distress Chest: Clear to auscultation Heart: Regular rate and rhythm Abdomen: Soft, nontender, nondistended  Lab Results: CBC   Recent Labs  04/30/17 1908 05/03/17 0327  WBC 8.5 6.9  HGB 11.4* 11.4*  HCT 32.8* 32.2*  PLT 447* 515*   BMET  Recent Labs  04/30/17 1908 05/02/17 0623  NA 135 143  K 3.8 3.1*  CL 104 110  CO2 26 27  GLUCOSE 139* 84  BUN 20 11  CREATININE 1.03 0.71  CALCIUM 8.3* 8.5*   PT/INR  Recent Labs  05/02/17 0925  LABPROT 16.7*  INR 1.36   ABG No results for input(s): PHART, HCO3 in the last 72 hours.  Invalid input(s): PCO2, PO2  Studies/Results: Mr Abdomen Mrcp Wo Contrast  Result Date: 05/01/2017 CLINICAL DATA:  Sludge in gallbladder. Concern for choledocholithiasis. Mildly elevated bilirubin. Go EXAM: MRI ABDOMEN WITHOUT CONTRAST  (INCLUDING MRCP) TECHNIQUE: Multiplanar multisequence MR imaging of the abdomen  was performed. Heavily T2-weighted images of the biliary and pancreatic ducts were obtained, and three-dimensional MRCP images were rendered by post processing. COMPARISON:  CT 04/30/2017, ultrasound 05/01/2017 FINDINGS: Exam is significantly degraded by patient respiratory motion. The MRCP images are particularly affected. Lower chest:  Lung bases are clear. Hepatobiliary: Is mild intrahepatic and extrahepatic biliary duct dilatation. There are multiple stones and sludge within the gallbladder lumen which. There is a filling defect within the proximal common bile duct measuring 8 mm (image 16, series 4) also seen on coronal image 31, series 3. More distal filling defect noted on image 16 and 17 of series 5 measuring 9 and 4 mm. There is no focal hepatic lesion. Along the coronal T2 weighted imaging (series 10) there is 11 mm filling defect in the most distal common bile duct distal to a 9 mm filling defect. This sequence appears best for evaluating these distal filling defects within common bile duct (image 7 through 10 of series 10. Pancreas: The pancreatic parenchyma appears atrophic. The pancreatic duct is not dilated. Spleen: Normal spleen. Adrenals/urinary tract: Adrenal glands normal. Multiple cysts of the kidneys appear benign. Stomach/Bowel: Stomach and limited of the small bowel is unremarkable Vascular/Lymphatic: Abdominal aortic normal caliber. No retroperitoneal periportal lymphadenopathy. Musculoskeletal: No aggressive osseous lesion IMPRESSION: 1. Two large filling defects within the distal common bile duct most consistent choledocholithiasis. 2. Mild extra intrahepatic biliary duct dilatation. 3. Multiple stones and sludge within the gallbladder lumen. 4. Chronic atrophy of the pancreas.  No pancreatic duct  dilatation. These results will be called to the ordering clinician or representative by the Radiologist Assistant, and communication documented in the PACS or zVision Dashboard. Electronically  Signed   By: Genevive Bi M.D.   On: 05/01/2017 14:27   Mr 3d Recon At Scanner  Result Date: 05/01/2017 CLINICAL DATA:  Sludge in gallbladder. Concern for choledocholithiasis. Mildly elevated bilirubin. Go EXAM: MRI ABDOMEN WITHOUT CONTRAST  (INCLUDING MRCP) TECHNIQUE: Multiplanar multisequence MR imaging of the abdomen was performed. Heavily T2-weighted images of the biliary and pancreatic ducts were obtained, and three-dimensional MRCP images were rendered by post processing. COMPARISON:  CT 04/30/2017, ultrasound 05/01/2017 FINDINGS: Exam is significantly degraded by patient respiratory motion. The MRCP images are particularly affected. Lower chest:  Lung bases are clear. Hepatobiliary: Is mild intrahepatic and extrahepatic biliary duct dilatation. There are multiple stones and sludge within the gallbladder lumen which. There is a filling defect within the proximal common bile duct measuring 8 mm (image 16, series 4) also seen on coronal image 31, series 3. More distal filling defect noted on image 16 and 17 of series 5 measuring 9 and 4 mm. There is no focal hepatic lesion. Along the coronal T2 weighted imaging (series 10) there is 11 mm filling defect in the most distal common bile duct distal to a 9 mm filling defect. This sequence appears best for evaluating these distal filling defects within common bile duct (image 7 through 10 of series 10. Pancreas: The pancreatic parenchyma appears atrophic. The pancreatic duct is not dilated. Spleen: Normal spleen. Adrenals/urinary tract: Adrenal glands normal. Multiple cysts of the kidneys appear benign. Stomach/Bowel: Stomach and limited of the small bowel is unremarkable Vascular/Lymphatic: Abdominal aortic normal caliber. No retroperitoneal periportal lymphadenopathy. Musculoskeletal: No aggressive osseous lesion IMPRESSION: 1. Two large filling defects within the distal common bile duct most consistent choledocholithiasis. 2. Mild extra intrahepatic biliary  duct dilatation. 3. Multiple stones and sludge within the gallbladder lumen. 4. Chronic atrophy of the pancreas.  No pancreatic duct dilatation. These results will be called to the ordering clinician or representative by the Radiologist Assistant, and communication documented in the PACS or zVision Dashboard. Electronically Signed   By: Genevive Bi M.D.   On: 05/01/2017 14:27    Anti-infectives: Anti-infectives    Start     Dose/Rate Route Frequency Ordered Stop   05/01/17 0930  piperacillin-tazobactam (ZOSYN) IVPB 3.375 g     3.375 g 12.5 mL/hr over 240 Minutes Intravenous Every 8 hours 05/01/17 0817        Assessment/Plan:  81 year old male admitted with the working diagnosis of cholangitis. Plan for ERCP today. Patient will likely need a cholecystectomy in the near future however discussed with the patient and the family that we would discuss timing and planning of the surgery after the ERCP. This is an atypical presentation.  Julez Huseby T. Tonita Cong, MD, [facility] General Surgeon [facility]  Day [facility] [phone] Night [facility] [phone] 05/03/2017

## 2017-05-03 NOTE — Transfer of Care (Signed)
Immediate Anesthesia Transfer of Care Note  Patient: Jack Rios  Procedure(s) Performed: Procedure(s): ENDOSCOPIC RETROGRADE CHOLANGIOPANCREATOGRAPHY (ERCP) WITH PROPOFOL (N/A)  Patient Location: Endoscopy Unit  Anesthesia Type:General  Level of Consciousness: sedated and responds to stimulation  Airway & Oxygen Therapy: Patient Spontanous Breathing and Patient connected to nasal cannula oxygen  Post-op Assessment: Report given to RN and Post -op Vital signs reviewed and stable  Post vital signs: Reviewed and stable  Last Vitals:  Vitals:   05/03/17 1138 05/03/17 1315  BP: (!) 152/87   Pulse:    Resp: 20 16  Temp: 37.4 C (!) 35.8 C  SpO2: 99%     Last Pain:  Vitals:   05/03/17 1315  TempSrc: Tympanic  PainSc:          Complications: No apparent anesthesia complications

## 2017-05-03 NOTE — Anesthesia Preprocedure Evaluation (Signed)
Anesthesia Evaluation  Patient identified by MRN, date of birth, ID band Patient awake    Reviewed: Allergy & Precautions, H&P , NPO status , Patient's Chart, lab work & pertinent test results, reviewed documented beta blocker date and time   Airway Mallampati: I   Neck ROM: full    Dental  (+) Poor Dentition, Teeth Intact   Pulmonary neg pulmonary ROS, neg shortness of breath, asthma , pneumonia, resolved, former smoker,    Pulmonary exam normal        Cardiovascular Exercise Tolerance: Poor negative cardio ROS Normal cardiovascular exam Rhythm:regular Rate:Normal     Neuro/Psych negative neurological ROS  negative psych ROS   GI/Hepatic negative GI ROS, Neg liver ROS, PUD,   Endo/Other  negative endocrine ROS  Renal/GU negative Renal ROS  negative genitourinary   Musculoskeletal   Abdominal   Peds  Hematology negative hematology ROS (+)   Anesthesia Other Findings Past Medical History: No date: Arthritis No date: Asthma No date: Lung nodule No date: Neuropathy No date: Peptic ulcer Past Surgical History: No date: CERVICAL SPINE SURGERY BMI    Body Mass Index:  20.28 kg/m     Reproductive/Obstetrics negative OB ROS                             Anesthesia Physical Anesthesia Plan  ASA: III  Anesthesia Plan: General   Post-op Pain Management:    Induction:   PONV Risk Score and Plan:   Airway Management Planned:   Additional Equipment:   Intra-op Plan:   Post-operative Plan:   Informed Consent: I have reviewed the patients History and Physical, chart, labs and discussed the procedure including the risks, benefits and alternatives for the proposed anesthesia with the patient or authorized representative who has indicated his/her understanding and acceptance.   Dental Advisory Given  Plan Discussed with: CRNA  Anesthesia Plan Comments:         Anesthesia  Quick Evaluation

## 2017-05-03 NOTE — Progress Notes (Signed)
  Revisited with the patient this evening. He tolerated his ERCP well.  Discussed the findings and the indication for laparoscopic cholecystectomy. However, given the operating room schedule tomorrow discussed that he would likely be Wednesday morning before we can proceed to the operating room. He voiced understanding. Discussed that his diet could be advanced tomorrow with nothing by mouth after midnight tomorrow night.  We will discuss the procedure for lap scopic cholecystectomy in detail tomorrow.  Ricarda Frame, MD [facility] General Surgeon [facility]  Day [facility] [phone] Night [facility] [phone]

## 2017-05-04 ENCOUNTER — Encounter: Payer: Self-pay | Admitting: Gastroenterology

## 2017-05-04 DIAGNOSIS — K8020 Calculus of gallbladder without cholecystitis without obstruction: Secondary | ICD-10-CM

## 2017-05-04 DIAGNOSIS — K838 Other specified diseases of biliary tract: Secondary | ICD-10-CM

## 2017-05-04 LAB — HEPATITIS PANEL, ACUTE
HCV Ab: <0.1 s/co ratio (ref 0.0–0.9)
Hep A IgM: NEGATIVE
Hep B C IgM: NEGATIVE
Hepatitis B Surface Ag: NEGATIVE

## 2017-05-04 LAB — HEPATIC FUNCTION PANEL
ALBUMIN: 3.1 g/dL — AB (ref 3.5–5.0)
ALK PHOS: 84 U/L (ref 38–126)
ALT: 70 U/L — AB (ref 17–63)
AST: 34 U/L (ref 15–41)
BILIRUBIN TOTAL: 0.9 mg/dL (ref 0.3–1.2)
Bilirubin, Direct: 0.2 mg/dL (ref 0.1–0.5)
Indirect Bilirubin: 0.7 mg/dL (ref 0.3–0.9)
TOTAL PROTEIN: 5.8 g/dL — AB (ref 6.5–8.1)

## 2017-05-04 MED ORDER — BUDESONIDE 0.5 MG/2ML IN SUSP
0.5000 mg | Freq: Two times a day (BID) | RESPIRATORY_TRACT | Status: DC
  Administered 2017-05-04: 20:00:00 0.5 mg via RESPIRATORY_TRACT
  Filled 2017-05-04 (×2): qty 2

## 2017-05-04 NOTE — Care Management Important Message (Signed)
Important Message  Patient Details  Name: JAMAURI KRUZEL MRN: [id number] Date of Birth: 1936-04-21   Medicare Important Message Given:  Yes Signed IM notice given   Eber Hong, RN 05/04/2017, 3:43 PM

## 2017-05-04 NOTE — Progress Notes (Signed)
Pharmacy Antibiotic Note  Jack Rios is a 81 y.o. male admitted on 04/30/2017 with IAI.  Pharmacy has been consulted for Zosyn dosing.  Plan: Continue Zosyn 3.375g IV q8h (4 hour infusion).  Height:  (172.7 cm) Weight: 130 lb 12.8 oz (59.3 kg) IBW/kg (Calculated) : 68.4  Temp (24hrs), Avg:97.9 F (36.6 C), Min:96.4 F (35.8 C), Max:99.4 F (37.4 C)   Recent Labs Lab 04/30/17 1908 04/30/17 1910 05/02/17 0623 05/03/17 0327  WBC 8.5  --   --  6.9  CREATININE 1.03  --  0.71  --   LATICACIDVEN  --  1.2  --   --     Estimated Creatinine Clearance: 62.8 mL/min (by C-G formula based on SCr of 0.71 mg/dL).    Allergies  Allergen Reactions  . Molds & Smuts     Antimicrobials this admission: Zosyn 9/22 >>   Dose adjustments this admission:   Microbiology results: 9/22 BCx: NGTD  Thank you for allowing pharmacy to be a part of this patient's care.  Gardner Candle, PharmD, BCPS Clinical Pharmacist 05/04/2017 9:25 AM

## 2017-05-04 NOTE — Progress Notes (Signed)
[employer] - [place] [facility]                                                                                                                                                                                  Patient Demographics   Jack Rios, is a 81 y.o. male, DOB - June 19, 1936, ZHY:[id number]  Admit date - 04/30/2017   Admitting Physician Arnaldo Natal, MD  Outpatient Primary MD for the patient is System, Pcp Not In   LOS - 3  Subjective: Patient denies any abdominal pain    Review of Systems:   CONSTITUTIONAL: No documented fever. No fatigue, weakness. No weight gain, no weight loss.  EYES: No blurry or double vision.  ENT: No tinnitus. No postnasal drip. No redness of the oropharynx.  RESPIRATORY: No cough, no wheeze, no hemoptysis. No dyspnea.  CARDIOVASCULAR: No chest pain. No orthopnea. No palpitations. No syncope.  GASTROINTESTINAL: No nausea, no vomiting or diarrhea. No abdominal pain. No melena or hematochezia.  GENITOURINARY: No dysuria or hematuria.  ENDOCRINE: No polyuria or nocturia. No heat or cold intolerance.  HEMATOLOGY: No anemia. No bruising. No bleeding.  INTEGUMENTARY: No rashes. No lesions.  MUSCULOSKELETAL: No arthritis. No swelling. No gout.  NEUROLOGIC: No numbness, tingling, or ataxia. No seizure-type activity.  PSYCHIATRIC: No anxiety. No insomnia. No ADD.    Vitals:   Vitals:   05/03/17 1958 05/04/17 0433 05/04/17 0500 05/04/17 0748  BP: (!) 141/79 133/71    Pulse: (!) 106 77    Resp: 20 20    Temp: 98.2 F (36.8 C) 98.1 F (36.7 C)    TempSrc: Oral     SpO2: 98% 98%  95%  Weight:   130 lb 12.8 oz (59.3 kg)   Height:        Wt Readings from Last 3 Encounters:  05/04/17 130 lb 12.8 oz (59.3 kg)  09/26/15 126 lb 14.4 oz (57.6 kg)     Intake/Output Summary (Last 24 hours) at 05/04/17 1058 Last data filed at 05/04/17 1027  Gross per 24 hour  Intake             1460 ml  Output                0 ml  Net              1460 ml    Physical Exam:   GENERAL: Pleasant-appearing in no apparent distress.  HEAD, EYES, EARS, NOSE AND THROAT: Atraumatic, normocephalic. Extraocular muscles are intact. Pupils equal and reactive to light. Sclerae anicteric. No conjunctival injection. No oro-pharyngeal erythema.  NECK: Supple. There is no jugular venous distention. No bruits, no  lymphadenopathy, no thyromegaly.  HEART: Regular rate and rhythm,. No murmurs, no rubs, no clicks.  LUNGS: Clear to auscultation bilaterally. No rales or rhonchi. No wheezes.  ABDOMEN: Soft, flat, nontender, nondistended. Has good bowel sounds. No hepatosplenomegaly appreciated.  EXTREMITIES: No evidence of any cyanosis, clubbing, or peripheral edema.  +2 pedal and radial pulses bilaterally.  NEUROLOGIC: The patient is alert, awake, and oriented x3 with no focal motor or sensory deficits appreciated bilaterally.  SKIN: Moist and warm with no rashes appreciated.  Psych: Not anxious, depressed LN: No inguinal LN enlargement    Antibiotics   Anti-infectives    Start     Dose/Rate Route Frequency Ordered Stop   05/01/17 0930  piperacillin-tazobactam (ZOSYN) IVPB 3.375 g     3.375 g 12.5 mL/hr over 240 Minutes Intravenous Every 8 hours 05/01/17 0817        Medications   Scheduled Meds: . aspirin  81 mg Oral Daily  . budesonide (PULMICORT) nebulizer solution  0.25 mg Nebulization BID  . diphenhydrAMINE  25 mg Oral Q6H  . docusate sodium  100 mg Oral BID  . enoxaparin (LOVENOX) injection  40 mg Subcutaneous Q24H  . ferrous sulfate  325 mg Oral QPM  . finasteride  5 mg Oral Daily  . fluticasone  2 spray Each Nare BID  . gabapentin  600 mg Oral TID  . hydroxyurea  500 mg Oral QHS  . Influenza vac split quadrivalent PF  0.5 mL Intramuscular Tomorrow-1000  . ipratropium  2 spray Each Nare Q12H  . pantoprazole  40 mg Oral BID  . terazosin  10 mg Oral QHS  . tiotropium  18 mcg Inhalation Daily   Continuous Infusions: . sodium  chloride 50 mL/hr at 05/03/17 2109  . piperacillin-tazobactam (ZOSYN)  IV 3.375 g (05/04/17 0945)   PRN Meds:.acetaminophen **OR** acetaminophen, albuterol, LORazepam, ondansetron **OR** ondansetron (ZOFRAN) IV, polyvinyl alcohol, traMADol   Data Review:   Micro Results Recent Results (from the past 240 hour(s))  Culture, blood (routine x 2)     Status: None (Preliminary result)   Collection Time: 05/01/17  1:09 AM  Result Value Ref Range Status   Specimen Description BLOOD RIGHT ANTECUBITAL  Final   Special Requests   Final    BOTTLES DRAWN AEROBIC AND ANAEROBIC Blood Culture results may not be optimal due to an excessive volume of blood received in culture bottles   Culture NO GROWTH 2 DAYS  Final   Report Status PENDING  Incomplete  Culture, blood (routine x 2)     Status: None (Preliminary result)   Collection Time: 05/01/17  1:09 AM  Result Value Ref Range Status   Specimen Description BLOOD RIGHT ANTECUBITAL  Final   Special Requests   Final    BOTTLES DRAWN AEROBIC AND ANAEROBIC Blood Culture adequate volume   Culture NO GROWTH 2 DAYS  Final   Report Status PENDING  Incomplete    Radiology Reports Dg Chest 2 View  Result Date: 04/30/2017 CLINICAL DATA:  High fevers.  Urinary incontinence. EXAM: CHEST  2 VIEW COMPARISON:  09/25/2015. FINDINGS: Normal sized heart. Clear lungs. Left shoulder degenerative changes. IMPRESSION: No acute abnormality. Electronically Signed   By: Beckie Salts M.D.   On: 04/30/2017 19:37   Ct Abdomen Pelvis W Contrast  Result Date: 04/30/2017 CLINICAL DATA:  High fever, new urinary incontinence EXAM: CT ABDOMEN AND PELVIS WITH CONTRAST TECHNIQUE: Multidetector CT imaging of the abdomen and pelvis was performed using the standard protocol following bolus administration  of intravenous contrast. CONTRAST:  ISOVUE-300 IOPAMIDOL (ISOVUE-300) INJECTION 61% COMPARISON:  None. FINDINGS: Lower chest: Lung bases are clear. Hepatobiliary: No focal hepatic  lesion. Mild intrahepatic duct dilatation. Common bile duct is prominent at 11 mm (image 26, series 79). Within the lumen gallbladder, there is lobular high-density material which appears to layer dependently. Difficult to tell if this high-density material or enhancing tissue. Largest rounded lesion measures 2 cm. Gallbladder is not distended. Pancreas: Pancreas is atrophic. Note pancreatic lesion present. No duct dilatation. Spleen: Normal spleen Adrenals/urinary tract: Adrenal glands normal. There bilateral nonenhancing renal cysts. Ureters are normal. The bladder is mildly distended. No bladder lesion identified. Prostate gland does indent the base the bladder. Stomach/Bowel: Stomach, small-bowel colon are unremarkable. Vascular/Lymphatic: Abdominal aorta is normal caliber with atherosclerotic calcification. There is no retroperitoneal or periportal lymphadenopathy. No pelvic lymphadenopathy. Reproductive: Prostate Other: No free fluid. Musculoskeletal: No aggressive osseous lesion. IMPRESSION: 1. Lobular high-density lobular lesions within the gallbladder may represent formed sludge. Cannot exclude enhancing tissue (follow-up or neoplasm). Recommend RIGHT upper quadrant ultrasound to evaluate for vascular flow within the larger of these lesions. The vascular flow present within lesion, consider contrast MRI for further evaluation. Favor formed high-density sludge. 2. Mild intrahepatic and extrahepatic duct dilatation. Recommend correlation with bilirubin levels and if elevated consider MRCP for further evaluation. 3. Atrophic pancreas appears chronic. No mass lesion. No duct dilatation. 4.  Aortic Atherosclerosis (ICD10-I70.0). Electronically Signed   By: Genevive Bi M.D.   On: 04/30/2017 23:51   Mr Abdomen Mrcp Wo Contrast  Result Date: 05/01/2017 CLINICAL DATA:  Sludge in gallbladder. Concern for choledocholithiasis. Mildly elevated bilirubin. Go EXAM: MRI ABDOMEN WITHOUT CONTRAST  (INCLUDING MRCP)  TECHNIQUE: Multiplanar multisequence MR imaging of the abdomen was performed. Heavily T2-weighted images of the biliary and pancreatic ducts were obtained, and three-dimensional MRCP images were rendered by post processing. COMPARISON:  CT 04/30/2017, ultrasound 05/01/2017 FINDINGS: Exam is significantly degraded by patient respiratory motion. The MRCP images are particularly affected. Lower chest:  Lung bases are clear. Hepatobiliary: Is mild intrahepatic and extrahepatic biliary duct dilatation. There are multiple stones and sludge within the gallbladder lumen which. There is a filling defect within the proximal common bile duct measuring 8 mm (image 16, series 4) also seen on coronal image 31, series 3. More distal filling defect noted on image 16 and 17 of series 5 measuring 9 and 4 mm. There is no focal hepatic lesion. Along the coronal T2 weighted imaging (series 10) there is 11 mm filling defect in the most distal common bile duct distal to a 9 mm filling defect. This sequence appears best for evaluating these distal filling defects within common bile duct (image 7 through 10 of series 10. Pancreas: The pancreatic parenchyma appears atrophic. The pancreatic duct is not dilated. Spleen: Normal spleen. Adrenals/urinary tract: Adrenal glands normal. Multiple cysts of the kidneys appear benign. Stomach/Bowel: Stomach and limited of the small bowel is unremarkable Vascular/Lymphatic: Abdominal aortic normal caliber. No retroperitoneal periportal lymphadenopathy. Musculoskeletal: No aggressive osseous lesion IMPRESSION: 1. Two large filling defects within the distal common bile duct most consistent choledocholithiasis. 2. Mild extra intrahepatic biliary duct dilatation. 3. Multiple stones and sludge within the gallbladder lumen. 4. Chronic atrophy of the pancreas.  No pancreatic duct dilatation. These results will be called to the ordering clinician or representative by the Radiologist Assistant, and communication  documented in the PACS or zVision Dashboard. Electronically Signed   By: Genevive Bi M.D.   On: 05/01/2017  14:27   Mr 3d Recon At Scanner  Result Date: 05/01/2017 CLINICAL DATA:  Sludge in gallbladder. Concern for choledocholithiasis. Mildly elevated bilirubin. Go EXAM: MRI ABDOMEN WITHOUT CONTRAST  (INCLUDING MRCP) TECHNIQUE: Multiplanar multisequence MR imaging of the abdomen was performed. Heavily T2-weighted images of the biliary and pancreatic ducts were obtained, and three-dimensional MRCP images were rendered by post processing. COMPARISON:  CT 04/30/2017, ultrasound 05/01/2017 FINDINGS: Exam is significantly degraded by patient respiratory motion. The MRCP images are particularly affected. Lower chest:  Lung bases are clear. Hepatobiliary: Is mild intrahepatic and extrahepatic biliary duct dilatation. There are multiple stones and sludge within the gallbladder lumen which. There is a filling defect within the proximal common bile duct measuring 8 mm (image 16, series 4) also seen on coronal image 31, series 3. More distal filling defect noted on image 16 and 17 of series 5 measuring 9 and 4 mm. There is no focal hepatic lesion. Along the coronal T2 weighted imaging (series 10) there is 11 mm filling defect in the most distal common bile duct distal to a 9 mm filling defect. This sequence appears best for evaluating these distal filling defects within common bile duct (image 7 through 10 of series 10. Pancreas: The pancreatic parenchyma appears atrophic. The pancreatic duct is not dilated. Spleen: Normal spleen. Adrenals/urinary tract: Adrenal glands normal. Multiple cysts of the kidneys appear benign. Stomach/Bowel: Stomach and limited of the small bowel is unremarkable Vascular/Lymphatic: Abdominal aortic normal caliber. No retroperitoneal periportal lymphadenopathy. Musculoskeletal: No aggressive osseous lesion IMPRESSION: 1. Two large filling defects within the distal common bile duct most  consistent choledocholithiasis. 2. Mild extra intrahepatic biliary duct dilatation. 3. Multiple stones and sludge within the gallbladder lumen. 4. Chronic atrophy of the pancreas.  No pancreatic duct dilatation. These results will be called to the ordering clinician or representative by the Radiologist Assistant, and communication documented in the PACS or zVision Dashboard. Electronically Signed   By: Genevive Bi M.D.   On: 05/01/2017 14:27   Dg C-arm 1-60 Min-no Report  Result Date: 05/03/2017 Fluoroscopy was utilized by the requesting physician.  No radiographic interpretation.   US Abdomen Limited Ruq  Result Date: 05/01/2017 CLINICAL DATA:  Elevated liver function studies. EXAM: ULTRASOUND ABDOMEN LIMITED RIGHT UPPER QUADRANT COMPARISON:  CT abdomen and pelvis 04/30/2017 FINDINGS: Gallbladder: Multiple stones and sludge demonstrated in the gallbladder. Largest stone measures about 2 cm diameter. No flow is demonstrated within the intraluminal contents. Gallbladder is mildly distended. No wall thickening or edema. Common bile duct: Diameter: 9.2 mm. This represents mild extrahepatic bile duct dilatation. No obstructing stone is demonstrated but the distal common bile duct is obscured by bowel gas. Review of previous CT shows increased density in the distal common bile duct likely representing common duct stones. Mild intrahepatic bile duct dilatation is also demonstrated. Liver: No focal lesion identified. Within normal limits in parenchymal echogenicity. Portal vein is patent on color Doppler imaging with normal direction of blood flow towards the liver. Incidental note of a simple appearing cyst in the right kidney measuring 6.4 cm maximal diameter. IMPRESSION: 1. Cholelithiasis with multiple stones and sludge in the gallbladder. No additional changes to suggest cholecystitis. 2. Dilated intra and extrahepatic bile ducts. Probable choledocholithiasis shown at previous CT. The distal bile duct is  not seen on ultrasound due to overlying bowel gas. 3. Benign-appearing cyst in the right kidney. Electronically Signed   By: Burman Nieves M.D.   On: 05/01/2017 03:53     CBC  Recent Labs Lab 04/30/17 1908 05/03/17 0327  WBC 8.5 6.9  HGB 11.4* 11.4*  HCT 32.8* 32.2*  PLT 447* 515*  MCV 108.1* 104.5*  MCH 37.5* 37.0*  MCHC 34.7 35.4  RDW 15.8* 15.5*  LYMPHSABS 0.1*  --   MONOABS 0.6  --   EOSABS 0.0  --   BASOSABS 0.1  --     Chemistries   Recent Labs Lab 04/30/17 1908 05/02/17 0623 05/03/17 0327 05/04/17 0525  NA 135 143  --   --   K 3.8 3.1*  --   --   CL 104 110  --   --   CO2 26 27  --   --   GLUCOSE 139* 84  --   --   BUN 20 11  --   --   CREATININE 1.03 0.71  --   --   CALCIUM 8.3* 8.5*  --   --   AST 442* 101* 54* 34  ALT 236* 141* 100* 70*  ALKPHOS 110 111 88 84  BILITOT 1.3* 1.1 1.1 0.9   ------------------------------------------------------------------------------------------------------------------ estimated creatinine clearance is 62.8 mL/min (by C-G formula based on SCr of 0.71 mg/dL). ------------------------------------------------------------------------------------------------------------------ No results for input(s): HGBA1C in the last 72 hours. ------------------------------------------------------------------------------------------------------------------ No results for input(s): CHOL, HDL, LDLCALC, TRIG, CHOLHDL, LDLDIRECT in the last 72 hours. ------------------------------------------------------------------------------------------------------------------ No results for input(s): TSH, T4TOTAL, T3FREE, THYROIDAB in the last 72 hours.  Invalid input(s): FREET3 ------------------------------------------------------------------------------------------------------------------ No results for input(s): VITAMINB12, FOLATE, FERRITIN, TIBC, IRON, RETICCTPCT in the last 72 hours.  Coagulation profile  Recent Labs Lab 05/02/17 0925  INR  1.36    No results for input(s): DDIMER in the last 72 hours.  Cardiac Enzymes  Recent Labs Lab 04/30/17 1908  TROPONINI <0.03   ------------------------------------------------------------------------------------------------------------------ Invalid input(s): POCBNP    Assessment & Plan   1.  Fever Due Ascending cholangitis: Due to cholangitis continue IV Zosyn,blood cultures negative so far. Plan for surgery tomorrow likely would continue IV antibiotics for today 2. Choldeocholelithasis due to gallstones Status post stone removal and spincterectomy 3. Asthma/COPD: Continue inhaled corticosteroid, Spiriva and albuterol. 4. BPH: Continue Proscar and Terazosin 5. DVT prophylaxis: Lovenox 6. GI prophylaxis: Pantoprazole      Code Status Orders        Start     Ordered   05/01/17 0424  Full code  Continuous     05/01/17 0423    Code Status History    Date Active Date Inactive Code Status Order ID Comments User Context   09/26/2015 12:32 AM 09/26/2015  6:08 PM Full Code [id number]  Ihor Austin, MD ED           Consults gi   DVT Prophylaxis  Lovenox   Lab Results  Component Value Date   PLT 515 (H) 05/03/2017     Time Spent in minutes   Greater than 50% of time spent in care coordination and counseling patient regarding the condition and plan of care.   Auburn Bilberry M.D on 05/04/2017 at 10:58 AM  Between 7am to 6pm - Pager - [phone]  After 6pm go to [email or web address] - password [email or web address] [facility]  [facility]   Office  [phone]

## 2017-05-04 NOTE — Care Management (Signed)
Patient has been up and ambulating in room.  For lap chole 9/26.

## 2017-05-04 NOTE — Progress Notes (Signed)
CC: biliary colic Subjective: Patient is without complaints this morning. States he is hungry. Had a large bowel movement this morning that required changing of his sheets. Denies any abdominal pain  Objective: Vital signs in last 24 hours: Temp:  [96.4 F (35.8 C)-99.4 F (37.4 C)] 98.1 F (36.7 C) (09/25 0433) Pulse Rate:  [62-106] 77 (09/25 0433) Resp:  [16-20] 20 (09/25 0433) BP: (133-153)/(71-88) 133/71 (09/25 0433) SpO2:  [95 %-100 %] 95 % (09/25 0748) Weight:  [59.3 kg (130 lb 12.8 oz)] 59.3 kg (130 lb 12.8 oz) (09/25 0500) Last BM Date: 05/02/17  Intake/Output from previous day: 09/24 0701 - 09/25 0700 In: 1100 [P.O.:600; I.V.:500] Out: -  Intake/Output this shift: No intake/output data recorded.  Physical exam:  Gen.: No acute distress Chest: Clear to auscultation Heart: Regular rate and rhythm Abdomen: Soft, nontender, nondistended  Lab Results: CBC   Recent Labs  05/03/17 0327  WBC 6.9  HGB 11.4*  HCT 32.2*  PLT 515*   BMET  Recent Labs  05/02/17 0623  NA 143  K 3.1*  CL 110  CO2 27  GLUCOSE 84  BUN 11  CREATININE 0.71  CALCIUM 8.5*   PT/INR  Recent Labs  05/02/17 0925  LABPROT 16.7*  INR 1.36   ABG No results for input(s): PHART, HCO3 in the last 72 hours.  Invalid input(s): PCO2, PO2  Studies/Results: Dg C-arm 1-60 Min-no Report  Result Date: 05/03/2017 Fluoroscopy was utilized by the requesting physician.  No radiographic interpretation.    Anti-infectives: Anti-infectives    Start     Dose/Rate Route Frequency Ordered Stop   05/01/17 0930  piperacillin-tazobactam (ZOSYN) IVPB 3.375 g     3.375 g 12.5 mL/hr over 240 Minutes Intravenous Every 8 hours 05/01/17 0817        Assessment/Plan:  81 year old male one day status post ERCP. Doing well. I discussed the plan for laparoscopic cholecystectomy tomorrow morning. I discussed the procedure in detail.  The patient was given Agricultural engineer.  We discussed the risks  and benefits of a laparoscopic cholecystectomy and possible cholangiogram including, but not limited to bleeding, infection, injury to surrounding structures such as the intestine or liver, bile leak, retained gallstones, need to convert to an open procedure, prolonged diarrhea, blood clots such as  DVT, common bile duct injury, anesthesia risks, and possible need for additional procedures.  The likelihood of improvement in symptoms and return to the patient's normal status is good. We discussed the typical post-operative recovery course.    Lyndsay Talamante T. Tonita Cong, MD, [facility] Surgeon [facility]  Day [facility] [phone] Night [facility] [phone] 05/04/2017

## 2017-05-05 ENCOUNTER — Inpatient Hospital Stay: Payer: Medicare Other | Admitting: Anesthesiology

## 2017-05-05 ENCOUNTER — Encounter
Admission: EM | Disposition: A | Discharge status: Home / Self Care | Payer: Self-pay | Source: Home / Self Care | Attending: Internal Medicine

## 2017-05-05 ENCOUNTER — Inpatient Hospital Stay: Payer: Medicare Other

## 2017-05-05 HISTORY — PX: CHOLECYSTECTOMY: SHX55

## 2017-05-05 SURGERY — LAPAROSCOPIC CHOLECYSTECTOMY WITH INTRAOPERATIVE CHOLANGIOGRAM
Anesthesia: General | Wound class: Clean Contaminated

## 2017-05-05 MED ORDER — FENTANYL CITRATE (PF) 100 MCG/2ML IJ SOLN
INTRAMUSCULAR | Status: AC
  Administered 2017-05-05: 50 ug via INTRAVENOUS
  Filled 2017-05-05: qty 2

## 2017-05-05 MED ORDER — FENTANYL CITRATE (PF) 100 MCG/2ML IJ SOLN
25.0000 ug | INTRAMUSCULAR | Status: DC | PRN
  Administered 2017-05-05 (×2): 50 ug via INTRAVENOUS

## 2017-05-05 MED ORDER — ONDANSETRON HCL 4 MG/2ML IJ SOLN
INTRAMUSCULAR | Status: DC | PRN
  Administered 2017-05-05: 4 mg via INTRAVENOUS

## 2017-05-05 MED ORDER — SUCCINYLCHOLINE CHLORIDE 20 MG/ML IJ SOLN
INTRAMUSCULAR | Status: AC
  Filled 2017-05-05: qty 1

## 2017-05-05 MED ORDER — HYDROCODONE-ACETAMINOPHEN 5-325 MG PO TABS: 1.0000 | ORAL_TABLET | Freq: Four times a day (QID) | ORAL | Status: DC | PRN

## 2017-05-05 MED ORDER — EPHEDRINE SULFATE 50 MG/ML IJ SOLN
INTRAMUSCULAR | Status: DC | PRN
  Administered 2017-05-05: 10 mg via INTRAVENOUS

## 2017-05-05 MED ORDER — DEXAMETHASONE SODIUM PHOSPHATE 10 MG/ML IJ SOLN
INTRAMUSCULAR | Status: AC
  Filled 2017-05-05: qty 1

## 2017-05-05 MED ORDER — EPHEDRINE SULFATE 50 MG/ML IJ SOLN
INTRAMUSCULAR | Status: AC
  Filled 2017-05-05: qty 1

## 2017-05-05 MED ORDER — FENTANYL CITRATE (PF) 100 MCG/2ML IJ SOLN
INTRAMUSCULAR | Status: AC
  Filled 2017-05-05: qty 2

## 2017-05-05 MED ORDER — ROCURONIUM BROMIDE 100 MG/10ML IV SOLN
INTRAVENOUS | Status: DC | PRN
  Administered 2017-05-05: 40 mg via INTRAVENOUS
  Administered 2017-05-05: 10 mg via INTRAVENOUS

## 2017-05-05 MED ORDER — PHENYLEPHRINE HCL 10 MG/ML IJ SOLN
INTRAMUSCULAR | Status: AC
  Filled 2017-05-05: qty 1

## 2017-05-05 MED ORDER — PROPOFOL 10 MG/ML IV BOLUS
INTRAVENOUS | Status: DC | PRN
  Administered 2017-05-05: 100 mg via INTRAVENOUS

## 2017-05-05 MED ORDER — DEXAMETHASONE SODIUM PHOSPHATE 10 MG/ML IJ SOLN
INTRAMUSCULAR | Status: DC | PRN
  Administered 2017-05-05: 5 mg via INTRAVENOUS

## 2017-05-05 MED ORDER — LIDOCAINE HCL (CARDIAC) 20 MG/ML IV SOLN
INTRAVENOUS | Status: DC | PRN
  Administered 2017-05-05: 100 mg via INTRAVENOUS

## 2017-05-05 MED ORDER — OXYCODONE HCL 5 MG PO TABS: 5.0000 mg | ORAL_TABLET | Freq: Once | ORAL | Status: DC | PRN

## 2017-05-05 MED ORDER — LIDOCAINE HCL (PF) 2 % IJ SOLN
INTRAMUSCULAR | Status: AC
  Filled 2017-05-05: qty 6

## 2017-05-05 MED ORDER — ROCURONIUM BROMIDE 50 MG/5ML IV SOLN
INTRAVENOUS | Status: AC
  Filled 2017-05-05: qty 1

## 2017-05-05 MED ORDER — FENTANYL CITRATE (PF) 100 MCG/2ML IJ SOLN
INTRAMUSCULAR | Status: DC | PRN
  Administered 2017-05-05 (×2): 25 ug via INTRAVENOUS
  Administered 2017-05-05: 100 ug via INTRAVENOUS
  Administered 2017-05-05: 50 ug via INTRAVENOUS

## 2017-05-05 MED ORDER — ONDANSETRON HCL 4 MG/2ML IJ SOLN
INTRAMUSCULAR | Status: AC
  Filled 2017-05-05: qty 2

## 2017-05-05 MED ORDER — HYDROCODONE-ACETAMINOPHEN 5-325 MG PO TABS: 1.0000 | ORAL_TABLET | Freq: Four times a day (QID) | ORAL | 0 refills | Status: DC | PRN

## 2017-05-05 MED ORDER — LIDOCAINE HCL 1 % IJ SOLN
INTRAMUSCULAR | Status: DC | PRN
  Administered 2017-05-05: 27 mL via SUBCUTANEOUS

## 2017-05-05 MED ORDER — PROPOFOL 10 MG/ML IV BOLUS
INTRAVENOUS | Status: AC
  Filled 2017-05-05: qty 20

## 2017-05-05 MED ORDER — OXYCODONE HCL 5 MG/5ML PO SOLN: 5.0000 mg | Freq: Once | ORAL | Status: DC | PRN

## 2017-05-05 MED ORDER — IOTHALAMATE MEGLUMINE 60 % INJ SOLN
INTRAMUSCULAR | Status: DC | PRN
  Administered 2017-05-05: 50 mL

## 2017-05-05 MED ORDER — GLYCOPYRROLATE 0.2 MG/ML IJ SOLN
INTRAMUSCULAR | Status: AC
  Filled 2017-05-05: qty 1

## 2017-05-05 MED ORDER — SUGAMMADEX SODIUM 200 MG/2ML IV SOLN
INTRAVENOUS | Status: DC | PRN
  Administered 2017-05-05: 200 mg via INTRAVENOUS

## 2017-05-05 MED ORDER — LACTATED RINGERS IV SOLN
INTRAVENOUS | Status: DC | PRN
  Administered 2017-05-05: 08:00:00 via INTRAVENOUS

## 2017-05-05 SURGICAL SUPPLY — 43 items
ADHESIVE MASTISOL STRL (MISCELLANEOUS) ×3 IMPLANT
APPLIER CLIP ROT 10 11.4 M/L (STAPLE) ×3
BLADE SURG SZ11 CARB STEEL (BLADE) ×3 IMPLANT
CANISTER SUCT 1200ML W/VALVE (MISCELLANEOUS) ×3 IMPLANT
CATH CHOLANG 76X19 KUMAR (CATHETERS) ×3 IMPLANT
CHLORAPREP W/TINT 26ML (MISCELLANEOUS) ×3 IMPLANT
CLIP APPLIE ROT 10 11.4 M/L (STAPLE) ×1 IMPLANT
CLOSURE WOUND 1/2 X4 (GAUZE/BANDAGES/DRESSINGS)
CONRAY 60ML FOR OR (MISCELLANEOUS) ×3 IMPLANT
DECANTER SPIKE VIAL GLASS SM (MISCELLANEOUS) IMPLANT
DRAPE SHEET LG 3/4 BI-LAMINATE (DRAPES) ×3 IMPLANT
DRSG TEGADERM 2-3/8X2-3/4 SM (GAUZE/BANDAGES/DRESSINGS) ×12 IMPLANT
DRSG TELFA 4X3 1S NADH ST (GAUZE/BANDAGES/DRESSINGS) ×3 IMPLANT
ELECT REM PT RETURN 9FT ADLT (ELECTROSURGICAL) ×3
ELECTRODE REM PT RTRN 9FT ADLT (ELECTROSURGICAL) ×1 IMPLANT
GLOVE BIO SURGEON STRL SZ7.5 (GLOVE) ×3 IMPLANT
GLOVE INDICATOR 8.0 STRL GRN (GLOVE) ×3 IMPLANT
GOWN STRL REUS W/ TWL LRG LVL3 (GOWN DISPOSABLE) ×3 IMPLANT
GOWN STRL REUS W/TWL LRG LVL3 (GOWN DISPOSABLE) ×6
GRASPER SUT TROCAR 14GX15 (MISCELLANEOUS) IMPLANT
IRRIGATION STRYKERFLOW (MISCELLANEOUS) ×1 IMPLANT
IRRIGATOR STRYKERFLOW (MISCELLANEOUS) ×3
IV NS 1000ML (IV SOLUTION) ×2
IV NS 1000ML BAXH (IV SOLUTION) ×1 IMPLANT
L-HOOK LAP DISP 36CM (ELECTROSURGICAL) ×3
LABEL OR SOLS (LABEL) ×3 IMPLANT
LHOOK LAP DISP 36CM (ELECTROSURGICAL) ×1 IMPLANT
NEEDLE HYPO 25X1 1.5 SAFETY (NEEDLE) ×3 IMPLANT
NEEDLE VERESS 14GA 120MM (NEEDLE) ×3 IMPLANT
NS IRRIG 500ML POUR BTL (IV SOLUTION) ×3 IMPLANT
PACK LAP CHOLECYSTECTOMY (MISCELLANEOUS) ×3 IMPLANT
PENCIL ELECTRO HAND CTR (MISCELLANEOUS) ×3 IMPLANT
POUCH ENDO CATCH 10MM SPEC (MISCELLANEOUS) ×3 IMPLANT
SCISSORS METZENBAUM CVD 33 (INSTRUMENTS) ×3 IMPLANT
SLEEVE ENDOPATH XCEL 5M (ENDOMECHANICALS) ×6 IMPLANT
STRIP CLOSURE SKIN 1/2X4 (GAUZE/BANDAGES/DRESSINGS) IMPLANT
SUT MNCRL 4-0 (SUTURE) ×2
SUT MNCRL 4-0 27XMFL (SUTURE) ×1
SUT VICRYL 0 AB UR-6 (SUTURE) IMPLANT
SUTURE MNCRL 4-0 27XMF (SUTURE) ×1 IMPLANT
TROCAR XCEL 12X100 BLDLESS (ENDOMECHANICALS) ×3 IMPLANT
TROCAR XCEL NON-BLD 5MMX100MML (ENDOMECHANICALS) ×3 IMPLANT
TUBING INSUFFLATOR HI FLOW (MISCELLANEOUS) ×3 IMPLANT

## 2017-05-05 NOTE — Brief Op Note (Signed)
04/30/2017 - 05/05/2017  9:05 AM  PATIENT:  Jack Rios  81 y.o. male  PRE-OPERATIVE DIAGNOSIS:  cholecystitis  POST-OPERATIVE DIAGNOSIS:  cholecystitis  PROCEDURE:  Procedure(s): LAPAROSCOPIC CHOLECYSTECTOMY WITH INTRAOPERATIVE CHOLANGIOGRAM (N/A)  SURGEON:  Surgeon(s) and Role:    Ricarda Frame, MD - Primary  PHYSICIAN ASSISTANT:   ASSISTANTS: none   ANESTHESIA:   general  EBL:  No intake/output data recorded.  BLOOD ADMINISTERED:none  DRAINS: none   LOCAL MEDICATIONS USED:  MARCAINE   , XYLOCAINE  and Amount: 27 ml  SPECIMEN:  Source of Specimen:  gallbladder  DISPOSITION OF SPECIMEN:  PATHOLOGY  COUNTS:  YES  TOURNIQUET:  * No tourniquets in log *  DICTATION: .Dragon Dictation  PLAN OF CARE: return to inpatient status  PATIENT DISPOSITION:  PACU - hemodynamically stable.   Delay start of Pharmacological VTE agent (>24hrs) due to surgical blood loss or risk of bleeding: no

## 2017-05-05 NOTE — Progress Notes (Signed)
[employer] - [place] at [facility]                                                                                                                                                                                  Patient Demographics   Jack Rios, is a 81 y.o. male, DOB - 1935-10-12, ZOX:[id number]  Admit date - 04/30/2017   Admitting Physician Arnaldo Natal, MD  Outpatient Primary MD for the patient is System, Pcp Not In   LOS - 4  Subjective: S/p lap choley earlier,     Review of Systems:   CONSTITUTIONAL: No documented fever. No fatigue, weakness. No weight gain, no weight loss.  EYES: No blurry or double vision.  ENT: No tinnitus. No postnasal drip. No redness of the oropharynx.  RESPIRATORY: No cough, no wheeze, no hemoptysis. No dyspnea.  CARDIOVASCULAR: No chest pain. No orthopnea. No palpitations. No syncope.  GASTROINTESTINAL: No nausea, no vomiting or diarrhea. No abdominal pain. No melena or hematochezia.  GENITOURINARY: No dysuria or hematuria.  ENDOCRINE: No polyuria or nocturia. No heat or cold intolerance.  HEMATOLOGY: No anemia. No bruising. No bleeding.  INTEGUMENTARY: No rashes. No lesions.  MUSCULOSKELETAL: No arthritis. No swelling. No gout.  NEUROLOGIC: No numbness, tingling, or ataxia. No seizure-type activity.  PSYCHIATRIC: No anxiety. No insomnia. No ADD.    Vitals:   Vitals:   05/05/17 0950 05/05/17 0955 05/05/17 1000 05/05/17 1018  BP:  (!) 156/80  (!) 141/66  Pulse:  77  81  Resp:  13    Temp:   97.8 F (36.6 C) 97.9 F (36.6 C)  TempSrc:    Oral  SpO2: 96% 99%  98%  Weight:      Height:        Wt Readings from Last 3 Encounters:  05/04/17 130 lb 12.8 oz (59.3 kg)  09/26/15 126 lb 14.4 oz (57.6 kg)     Intake/Output Summary (Last 24 hours) at 05/05/17 1236 Last data filed at 05/05/17 0942  Gross per 24 hour  Intake             1260 ml  Output              355 ml  Net              905 ml    Physical Exam:    GENERAL: Pleasant-appearing in no apparent distress.  HEAD, EYES, EARS, NOSE AND THROAT: Atraumatic, normocephalic. Extraocular muscles are intact. Pupils equal and reactive to light. Sclerae anicteric. No conjunctival injection. No oro-pharyngeal erythema.  NECK: Supple. There is no jugular venous distention. No bruits, no lymphadenopathy, no thyromegaly.  HEART: Regular  rate and rhythm,. No murmurs, no rubs, no clicks.  LUNGS: Clear to auscultation bilaterally. No rales or rhonchi. No wheezes.  ABDOMEN: Soft, flat, nontender, nondistended. Has good bowel sounds. No hepatosplenomegaly appreciated.  EXTREMITIES: No evidence of any cyanosis, clubbing, or peripheral edema.  +2 pedal and radial pulses bilaterally.  NEUROLOGIC: The patient is alert, awake, and oriented x3 with no focal motor or sensory deficits appreciated bilaterally.  SKIN: Moist and warm with no rashes appreciated.  Psych: Not anxious, depressed LN: No inguinal LN enlargement    Antibiotics   Anti-infectives    Start     Dose/Rate Route Frequency Ordered Stop   05/01/17 0930  piperacillin-tazobactam (ZOSYN) IVPB 3.375 g  Status:  Discontinued     3.375 g 12.5 mL/hr over 240 Minutes Intravenous Every 8 hours 05/01/17 0817 05/05/17 1102      Medications   Scheduled Meds: . aspirin  81 mg Oral Daily  . budesonide (PULMICORT) nebulizer solution  0.5 mg Nebulization BID  . diphenhydrAMINE  25 mg Oral Q6H  . docusate sodium  100 mg Oral BID  . enoxaparin (LOVENOX) injection  40 mg Subcutaneous Q24H  . ferrous sulfate  325 mg Oral QPM  . finasteride  5 mg Oral Daily  . fluticasone  2 spray Each Nare BID  . gabapentin  600 mg Oral TID  . hydroxyurea  500 mg Oral QHS  . Influenza vac split quadrivalent PF  0.5 mL Intramuscular Tomorrow-1000  . ipratropium  2 spray Each Nare Q12H  . pantoprazole  40 mg Oral BID  . terazosin  10 mg Oral QHS  . tiotropium  18 mcg Inhalation Daily   Continuous Infusions: . sodium  chloride 50 mL/hr at 05/04/17 2130   PRN Meds:.acetaminophen **OR** acetaminophen, albuterol, HYDROcodone-acetaminophen, LORazepam, ondansetron **OR** ondansetron (ZOFRAN) IV, polyvinyl alcohol, traMADol   Data Review:   Micro Results Recent Results (from the past 240 hour(s))  Culture, blood (routine x 2)     Status: None (Preliminary result)   Collection Time: 05/01/17  1:09 AM  Result Value Ref Range Status   Specimen Description BLOOD RIGHT ANTECUBITAL  Final   Special Requests   Final    BOTTLES DRAWN AEROBIC AND ANAEROBIC Blood Culture results may not be optimal due to an excessive volume of blood received in culture bottles   Culture NO GROWTH 4 DAYS  Final   Report Status PENDING  Incomplete  Culture, blood (routine x 2)     Status: None (Preliminary result)   Collection Time: 05/01/17  1:09 AM  Result Value Ref Range Status   Specimen Description BLOOD RIGHT ANTECUBITAL  Final   Special Requests   Final    BOTTLES DRAWN AEROBIC AND ANAEROBIC Blood Culture adequate volume   Culture NO GROWTH 4 DAYS  Final   Report Status PENDING  Incomplete    Radiology Reports Dg Chest 2 View  Result Date: 04/30/2017 CLINICAL DATA:  High fevers.  Urinary incontinence. EXAM: CHEST  2 VIEW COMPARISON:  09/25/2015. FINDINGS: Normal sized heart. Clear lungs. Left shoulder degenerative changes. IMPRESSION: No acute abnormality. Electronically Signed   By: Beckie Salts M.D.   On: 04/30/2017 19:37   Dg Cholangiogram Operative  Result Date: 05/05/2017 CLINICAL DATA:  Intraoperative cholangiogram during laparoscopic cholecystectomy. EXAM: INTRAOPERATIVE CHOLANGIOGRAM FLUOROSCOPY TIME:  1 minutes, 42 seconds COMPARISON:  ERCP - 05/03/2017 ; MRCP - 05/01/2017 FINDINGS: 9 spot intraoperative cholangiographic images of the right upper abdominal quadrant during laparoscopic cholecystectomy are provided for review.  There is faint opacification of the neck of the gallbladder as well as the peripheral  aspect of the cystic duct. Persistent filling defect within neck of the gallbladder may represent a gallstone. Otherwise, there is extravasation about the gallbladder fossa and caudal aspect of the right lobe of the liver. IMPRESSION: Suspected cholelithiasis. Otherwise, nondiagnostic intraoperative cholangiogram. Electronically Signed   By: Simonne Come M.D.   On: 05/05/2017 09:01   Ct Abdomen Pelvis W Contrast  Result Date: 04/30/2017 CLINICAL DATA:  High fever, new urinary incontinence EXAM: CT ABDOMEN AND PELVIS WITH CONTRAST TECHNIQUE: Multidetector CT imaging of the abdomen and pelvis was performed using the standard protocol following bolus administration of intravenous contrast. CONTRAST:  ISOVUE-300 IOPAMIDOL (ISOVUE-300) INJECTION 61% COMPARISON:  None. FINDINGS: Lower chest: Lung bases are clear. Hepatobiliary: No focal hepatic lesion. Mild intrahepatic duct dilatation. Common bile duct is prominent at 11 mm (image 26, series 79). Within the lumen gallbladder, there is lobular high-density material which appears to layer dependently. Difficult to tell if this high-density material or enhancing tissue. Largest rounded lesion measures 2 cm. Gallbladder is not distended. Pancreas: Pancreas is atrophic. Note pancreatic lesion present. No duct dilatation. Spleen: Normal spleen Adrenals/urinary tract: Adrenal glands normal. There bilateral nonenhancing renal cysts. Ureters are normal. The bladder is mildly distended. No bladder lesion identified. Prostate gland does indent the base the bladder. Stomach/Bowel: Stomach, small-bowel colon are unremarkable. Vascular/Lymphatic: Abdominal aorta is normal caliber with atherosclerotic calcification. There is no retroperitoneal or periportal lymphadenopathy. No pelvic lymphadenopathy. Reproductive: Prostate Other: No free fluid. Musculoskeletal: No aggressive osseous lesion. IMPRESSION: 1. Lobular high-density lobular lesions within the gallbladder may  represent formed sludge. Cannot exclude enhancing tissue (follow-up or neoplasm). Recommend RIGHT upper quadrant ultrasound to evaluate for vascular flow within the larger of these lesions. The vascular flow present within lesion, consider contrast MRI for further evaluation. Favor formed high-density sludge. 2. Mild intrahepatic and extrahepatic duct dilatation. Recommend correlation with bilirubin levels and if elevated consider MRCP for further evaluation. 3. Atrophic pancreas appears chronic. No mass lesion. No duct dilatation. 4.  Aortic Atherosclerosis (ICD10-I70.0). Electronically Signed   By: Genevive Bi M.D.   On: 04/30/2017 23:51   Mr Abdomen Mrcp Wo Contrast  Result Date: 05/01/2017 CLINICAL DATA:  Sludge in gallbladder. Concern for choledocholithiasis. Mildly elevated bilirubin. Go EXAM: MRI ABDOMEN WITHOUT CONTRAST  (INCLUDING MRCP) TECHNIQUE: Multiplanar multisequence MR imaging of the abdomen was performed. Heavily T2-weighted images of the biliary and pancreatic ducts were obtained, and three-dimensional MRCP images were rendered by post processing. COMPARISON:  CT 04/30/2017, ultrasound 05/01/2017 FINDINGS: Exam is significantly degraded by patient respiratory motion. The MRCP images are particularly affected. Lower chest:  Lung bases are clear. Hepatobiliary: Is mild intrahepatic and extrahepatic biliary duct dilatation. There are multiple stones and sludge within the gallbladder lumen which. There is a filling defect within the proximal common bile duct measuring 8 mm (image 16, series 4) also seen on coronal image 31, series 3. More distal filling defect noted on image 16 and 17 of series 5 measuring 9 and 4 mm. There is no focal hepatic lesion. Along the coronal T2 weighted imaging (series 10) there is 11 mm filling defect in the most distal common bile duct distal to a 9 mm filling defect. This sequence appears best for evaluating these distal filling defects within common bile duct  (image 7 through 10 of series 10. Pancreas: The pancreatic parenchyma appears atrophic. The pancreatic duct is not dilated. Spleen: Normal spleen.  Adrenals/urinary tract: Adrenal glands normal. Multiple cysts of the kidneys appear benign. Stomach/Bowel: Stomach and limited of the small bowel is unremarkable Vascular/Lymphatic: Abdominal aortic normal caliber. No retroperitoneal periportal lymphadenopathy. Musculoskeletal: No aggressive osseous lesion IMPRESSION: 1. Two large filling defects within the distal common bile duct most consistent choledocholithiasis. 2. Mild extra intrahepatic biliary duct dilatation. 3. Multiple stones and sludge within the gallbladder lumen. 4. Chronic atrophy of the pancreas.  No pancreatic duct dilatation. These results will be called to the ordering clinician or representative by the Radiologist Assistant, and communication documented in the PACS or zVision Dashboard. Electronically Signed   By: Genevive Bi M.D.   On: 05/01/2017 14:27   Mr 3d Recon At Scanner  Result Date: 05/01/2017 CLINICAL DATA:  Sludge in gallbladder. Concern for choledocholithiasis. Mildly elevated bilirubin. Go EXAM: MRI ABDOMEN WITHOUT CONTRAST  (INCLUDING MRCP) TECHNIQUE: Multiplanar multisequence MR imaging of the abdomen was performed. Heavily T2-weighted images of the biliary and pancreatic ducts were obtained, and three-dimensional MRCP images were rendered by post processing. COMPARISON:  CT 04/30/2017, ultrasound 05/01/2017 FINDINGS: Exam is significantly degraded by patient respiratory motion. The MRCP images are particularly affected. Lower chest:  Lung bases are clear. Hepatobiliary: Is mild intrahepatic and extrahepatic biliary duct dilatation. There are multiple stones and sludge within the gallbladder lumen which. There is a filling defect within the proximal common bile duct measuring 8 mm (image 16, series 4) also seen on coronal image 31, series 3. More distal filling defect noted on  image 16 and 17 of series 5 measuring 9 and 4 mm. There is no focal hepatic lesion. Along the coronal T2 weighted imaging (series 10) there is 11 mm filling defect in the most distal common bile duct distal to a 9 mm filling defect. This sequence appears best for evaluating these distal filling defects within common bile duct (image 7 through 10 of series 10. Pancreas: The pancreatic parenchyma appears atrophic. The pancreatic duct is not dilated. Spleen: Normal spleen. Adrenals/urinary tract: Adrenal glands normal. Multiple cysts of the kidneys appear benign. Stomach/Bowel: Stomach and limited of the small bowel is unremarkable Vascular/Lymphatic: Abdominal aortic normal caliber. No retroperitoneal periportal lymphadenopathy. Musculoskeletal: No aggressive osseous lesion IMPRESSION: 1. Two large filling defects within the distal common bile duct most consistent choledocholithiasis. 2. Mild extra intrahepatic biliary duct dilatation. 3. Multiple stones and sludge within the gallbladder lumen. 4. Chronic atrophy of the pancreas.  No pancreatic duct dilatation. These results will be called to the ordering clinician or representative by the Radiologist Assistant, and communication documented in the PACS or zVision Dashboard. Electronically Signed   By: Genevive Bi M.D.   On: 05/01/2017 14:27   Dg C-arm 1-60 Min-no Report  Result Date: 05/03/2017 Fluoroscopy was utilized by the requesting physician.  No radiographic interpretation.   US Abdomen Limited Ruq  Result Date: 05/01/2017 CLINICAL DATA:  Elevated liver function studies. EXAM: ULTRASOUND ABDOMEN LIMITED RIGHT UPPER QUADRANT COMPARISON:  CT abdomen and pelvis 04/30/2017 FINDINGS: Gallbladder: Multiple stones and sludge demonstrated in the gallbladder. Largest stone measures about 2 cm diameter. No flow is demonstrated within the intraluminal contents. Gallbladder is mildly distended. No wall thickening or edema. Common bile duct: Diameter: 9.2 mm.  This represents mild extrahepatic bile duct dilatation. No obstructing stone is demonstrated but the distal common bile duct is obscured by bowel gas. Review of previous CT shows increased density in the distal common bile duct likely representing common duct stones. Mild intrahepatic bile duct dilatation is also demonstrated.  Liver: No focal lesion identified. Within normal limits in parenchymal echogenicity. Portal vein is patent on color Doppler imaging with normal direction of blood flow towards the liver. Incidental note of a simple appearing cyst in the right kidney measuring 6.4 cm maximal diameter. IMPRESSION: 1. Cholelithiasis with multiple stones and sludge in the gallbladder. No additional changes to suggest cholecystitis. 2. Dilated intra and extrahepatic bile ducts. Probable choledocholithiasis shown at previous CT. The distal bile duct is not seen on ultrasound due to overlying bowel gas. 3. Benign-appearing cyst in the right kidney. Electronically Signed   By: Burman Nieves M.D.   On: 05/01/2017 03:53     CBC  Recent Labs Lab 04/30/17 1908 05/03/17 0327  WBC 8.5 6.9  HGB 11.4* 11.4*  HCT 32.8* 32.2*  PLT 447* 515*  MCV 108.1* 104.5*  MCH 37.5* 37.0*  MCHC 34.7 35.4  RDW 15.8* 15.5*  LYMPHSABS 0.1*  --   MONOABS 0.6  --   EOSABS 0.0  --   BASOSABS 0.1  --     Chemistries   Recent Labs Lab 04/30/17 1908 05/02/17 0623 05/03/17 0327 05/04/17 0525  NA 135 143  --   --   K 3.8 3.1*  --   --   CL 104 110  --   --   CO2 26 27  --   --   GLUCOSE 139* 84  --   --   BUN 20 11  --   --   CREATININE 1.03 0.71  --   --   CALCIUM 8.3* 8.5*  --   --   AST 442* 101* 54* 34  ALT 236* 141* 100* 70*  ALKPHOS 110 111 88 84  BILITOT 1.3* 1.1 1.1 0.9   ------------------------------------------------------------------------------------------------------------------ estimated creatinine clearance is 62.8 mL/min (by C-G formula based on SCr of 0.71  mg/dL). ------------------------------------------------------------------------------------------------------------------ No results for input(s): HGBA1C in the last 72 hours. ------------------------------------------------------------------------------------------------------------------ No results for input(s): CHOL, HDL, LDLCALC, TRIG, CHOLHDL, LDLDIRECT in the last 72 hours. ------------------------------------------------------------------------------------------------------------------ No results for input(s): TSH, T4TOTAL, T3FREE, THYROIDAB in the last 72 hours.  Invalid input(s): FREET3 ------------------------------------------------------------------------------------------------------------------ No results for input(s): VITAMINB12, FOLATE, FERRITIN, TIBC, IRON, RETICCTPCT in the last 72 hours.  Coagulation profile  Recent Labs Lab 05/02/17 0925  INR 1.36    No results for input(s): DDIMER in the last 72 hours.  Cardiac Enzymes  Recent Labs Lab 04/30/17 1908  TROPONINI <0.03   ------------------------------------------------------------------------------------------------------------------ Invalid input(s): POCBNP    Assessment & Plan   1.  Fever Due Ascending cholangitis: Due to cholangitis Now resolved 2. Choldeocholelithasis due to gallstones s/p lap choley Status post stone removal and spincterectomy 3. Asthma/COPD: Continue inhaled corticosteroid, Spiriva and albuterol. 4. BPH: Continue Proscar and Terazosin 5. DVT prophylaxis: Lovenox 6. GI prophylaxis: Pantoprazole   Possible discharge today later     Code Status Orders        Start     Ordered   05/01/17 0424  Full code  Continuous     05/01/17 0423    Code Status History    Date Active Date Inactive Code Status Order ID Comments User Context   09/26/2015 12:32 AM 09/26/2015  6:08 PM Full Code [id number]  Ihor Austin, MD ED           Consults gi   DVT Prophylaxis  Lovenox    Lab Results  Component Value Date   PLT 515 (H) 05/03/2017     Time Spent in minutes   Greater  than 50% of time spent in care coordination and counseling patient regarding the condition and plan of care.   Auburn Bilberry M.D on 05/05/2017 at 12:36 PM  Between 7am to 6pm - Pager - [phone]  After 6pm go to [email or web address] - password [email or web address] [facility]  [facility]   Office  [phone]

## 2017-05-05 NOTE — Progress Notes (Signed)
CC: choledocholithiasis Subjective: Patient reports no issues overnight. Ready for his surgery  Objective: Vital signs in last 24 hours: Temp:  [98.1 F (36.7 C)-98.4 F (36.9 C)] 98.1 F (36.7 C) (09/26 0508) Pulse Rate:  [73-81] 73 (09/26 0508) Resp:  [20] 20 (09/26 0508) BP: (135-161)/(71-83) 150/73 (09/26 0508) SpO2:  [95 %-100 %] 97 % (09/26 0508) Last BM Date: 05/04/17  Intake/Output from previous day: 09/25 0701 - 09/26 0700 In: 560 [P.O.:360; IV Piggyback:200] Out: 350 [Urine:350] Intake/Output this shift: No intake/output data recorded.  Physical exam:  Gen: NAD Resp: CTA CV: RRR Abd: Soft, NT, ND  Lab Results: CBC   Recent Labs  05/03/17 0327  WBC 6.9  HGB 11.4*  HCT 32.2*  PLT 515*   BMET No results for input(s): NA, K, CL, CO2, GLUCOSE, BUN, CREATININE, CALCIUM in the last 72 hours. PT/INR  Recent Labs  05/02/17 0925  LABPROT 16.7*  INR 1.36   ABG No results for input(s): PHART, HCO3 in the last 72 hours.  Invalid input(s): PCO2, PO2  Studies/Results: Dg C-arm 1-60 Min-no Report  Result Date: 05/03/2017 Fluoroscopy was utilized by the requesting physician.  No radiographic interpretation.    Anti-infectives: Anti-infectives    Start     Dose/Rate Route Frequency Ordered Stop   05/01/17 0930  piperacillin-tazobactam (ZOSYN) IVPB 3.375 g     3.375 g 12.5 mL/hr over 240 Minutes Intravenous Every 8 hours 05/01/17 0817        Assessment/Plan:  81 year old male s/p recent ERCP. Discussed laparoscopic cholecystectomy again. He desires to proceed. Possible discharge later today vs tomorrow pending surgical results.  Kamaree Wheatley T. Tonita Cong, MD, [facility] General Surgeon [facility]  [facility] [phone] Night [facility] [phone] 05/05/2017

## 2017-05-05 NOTE — Anesthesia Preprocedure Evaluation (Signed)
Anesthesia Evaluation  Patient identified by MRN, date of birth, ID band Patient awake    Reviewed: Allergy & Precautions, H&P , NPO status , Patient's Chart, lab work & pertinent test results  History of Anesthesia Complications Negative for: history of anesthetic complications  Airway Mallampati: III  TM Distance: <3 FB Neck ROM: limited    Dental  (+) Poor Dentition, Chipped, Missing, Caps   Pulmonary neg shortness of breath, asthma , pneumonia, former smoker,           Cardiovascular Exercise Tolerance: Good (-) angina+ CAD  (-) DOE      Neuro/Psych negative neurological ROS  negative psych ROS   GI/Hepatic Neg liver ROS, PUD, neg GERD  ,  Endo/Other  negative endocrine ROS  Renal/GU      Musculoskeletal  (+) Arthritis ,   Abdominal   Peds  Hematology negative hematology ROS (+)   Anesthesia Other Findings Past Medical History: No date: Arthritis No date: Asthma No date: Lung nodule No date: Neuropathy No date: Peptic ulcer  Past Surgical History: No date: CERVICAL SPINE SURGERY 05/03/2017: ENDOSCOPIC RETROGRADE CHOLANGIOPANCREATOGRAPHY (ERCP) WITH  PROPOFOL; N/A     Comment:  Procedure: ENDOSCOPIC RETROGRADE               CHOLANGIOPANCREATOGRAPHY (ERCP) WITH PROPOFOL;  Surgeon:               Midge Minium, MD;  Location: [facility] ENDOSCOPY;  Service:               Endoscopy;  Laterality: N/A;  BMI    Body Mass Index:  19.89 kg/m      Reproductive/Obstetrics negative OB ROS                             Anesthesia Physical Anesthesia Plan  ASA: III  Anesthesia Plan: General ETT   Post-op Pain Management:    Induction: Intravenous  PONV Risk Score and Plan: 3 and Ondansetron, Dexamethasone and Treatment may vary due to age or medical condition  Airway Management Planned: Oral ETT  Additional Equipment:   Intra-op Plan:   Post-operative Plan: Extubation in  OR  Informed Consent: I have reviewed the patients History and Physical, chart, labs and discussed the procedure including the risks, benefits and alternatives for the proposed anesthesia with the patient or authorized representative who has indicated his/her understanding and acceptance.   Dental Advisory Given  Plan Discussed with: Anesthesiologist, CRNA and Surgeon  Anesthesia Plan Comments: (Patient consented for risks of anesthesia including but not limited to:  - adverse reactions to medications - damage to teeth, lips or other oral mucosa - sore throat or hoarseness - Damage to heart, brain, lungs or loss of life  Patient voiced understanding.)        Anesthesia Quick Evaluation

## 2017-05-05 NOTE — Discharge Summary (Signed)
[employer] - [place] at [facility], 81 y.o., DOB 1936/04/07, MRN [id number]. Admission date: 04/30/2017 Discharge Date 05/05/2017 Primary MD System, Pcp Not In Admitting Physician Arnaldo Natal, MD  Admission Diagnosis  Delirium [R41.0] Elevated LFTs [R79.89] Fever, unspecified fever cause [R50.9] Biliary sludge determined by ultrasound [K83.8]  Discharge Diagnosis   Active Problems:   Transaminitis   Calculus of gallbladder and bile duct without cholecystitis, with obstruction   Calculus of bile duct with acute cholangitis with obstruction   Fever with sepsis due to cholangitis   Jaundice   Choledocholithiasis   Common bile duct dilatation   Stenosis of duodenum   Biliary sludge determined by ultrasound   H/o pud   Neuropathy   H/o asthma        Hospital Course  the patient with past medical history of asthmas well as a lung nodule presents emergency department with fever and altered mental status.the patient's wife reports MAXIMUM TEMPERATURE of 102.33F. CT abdomen showed lesions in GB as well as intra and extra hepatic dilation. Pt was treated with antibiotics, mrcp showed cbd stone, was seen by GI had ERCP with stone removal. He was also seen by surgery and had his GB removed today. Pt doing well post op.           Consults gi,surgery  Significant Tests:  See full reports for all details     Dg Chest 2 View  Result Date: 04/30/2017 CLINICAL DATA:  High fevers.  Urinary incontinence. EXAM: CHEST  2 VIEW COMPARISON:  09/25/2015. FINDINGS: Normal sized heart. Clear lungs. Left shoulder degenerative changes. IMPRESSION: No acute abnormality. Electronically Signed   By: Beckie Salts M.D.   On: 04/30/2017 19:37   Dg Cholangiogram Operative  Result Date: 05/05/2017 CLINICAL DATA:  Intraoperative cholangiogram during laparoscopic cholecystectomy. EXAM: INTRAOPERATIVE CHOLANGIOGRAM FLUOROSCOPY TIME:  1 minutes, 42 seconds  COMPARISON:  ERCP - 05/03/2017 ; MRCP - 05/01/2017 FINDINGS: 9 spot intraoperative cholangiographic images of the right upper abdominal quadrant during laparoscopic cholecystectomy are provided for review. There is faint opacification of the neck of the gallbladder as well as the peripheral aspect of the cystic duct. Persistent filling defect within neck of the gallbladder may represent a gallstone. Otherwise, there is extravasation about the gallbladder fossa and caudal aspect of the right lobe of the liver. IMPRESSION: Suspected cholelithiasis. Otherwise, nondiagnostic intraoperative cholangiogram. Electronically Signed   By: Simonne Come M.D.   On: 05/05/2017 09:01   Ct Abdomen Pelvis W Contrast  Result Date: 04/30/2017 CLINICAL DATA:  High fever, new urinary incontinence EXAM: CT ABDOMEN AND PELVIS WITH CONTRAST TECHNIQUE: Multidetector CT imaging of the abdomen and pelvis was performed using the standard protocol following bolus administration of intravenous contrast. CONTRAST:  ISOVUE-300 IOPAMIDOL (ISOVUE-300) INJECTION 61% COMPARISON:  None. FINDINGS: Lower chest: Lung bases are clear. Hepatobiliary: No focal hepatic lesion. Mild intrahepatic duct dilatation. Common bile duct is prominent at 11 mm (image 26, series 79). Within the lumen gallbladder, there is lobular high-density material which appears to layer dependently. Difficult to tell if this high-density material or enhancing tissue. Largest rounded lesion measures 2 cm. Gallbladder is not distended. Pancreas: Pancreas is atrophic. Note pancreatic lesion present. No duct dilatation. Spleen: Normal spleen Adrenals/urinary tract: Adrenal glands normal. There bilateral nonenhancing renal cysts. Ureters are normal. The bladder is mildly distended. No bladder lesion identified. Prostate gland does indent the base the bladder. Stomach/Bowel: Stomach, small-bowel colon are unremarkable. Vascular/Lymphatic: Abdominal aorta is normal  caliber with  atherosclerotic calcification. There is no retroperitoneal or periportal lymphadenopathy. No pelvic lymphadenopathy. Reproductive: Prostate Other: No free fluid. Musculoskeletal: No aggressive osseous lesion. IMPRESSION: 1. Lobular high-density lobular lesions within the gallbladder may represent formed sludge. Cannot exclude enhancing tissue (follow-up or neoplasm). Recommend RIGHT upper quadrant ultrasound to evaluate for vascular flow within the larger of these lesions. The vascular flow present within lesion, consider contrast MRI for further evaluation. Favor formed high-density sludge. 2. Mild intrahepatic and extrahepatic duct dilatation. Recommend correlation with bilirubin levels and if elevated consider MRCP for further evaluation. 3. Atrophic pancreas appears chronic. No mass lesion. No duct dilatation. 4.  Aortic Atherosclerosis (ICD10-I70.0). Electronically Signed   By: Genevive Bi M.D.   On: 04/30/2017 23:51   Mr Abdomen Mrcp Wo Contrast  Result Date: 05/01/2017 CLINICAL DATA:  Sludge in gallbladder. Concern for choledocholithiasis. Mildly elevated bilirubin. Go EXAM: MRI ABDOMEN WITHOUT CONTRAST  (INCLUDING MRCP) TECHNIQUE: Multiplanar multisequence MR imaging of the abdomen was performed. Heavily T2-weighted images of the biliary and pancreatic ducts were obtained, and three-dimensional MRCP images were rendered by post processing. COMPARISON:  CT 04/30/2017, ultrasound 05/01/2017 FINDINGS: Exam is significantly degraded by patient respiratory motion. The MRCP images are particularly affected. Lower chest:  Lung bases are clear. Hepatobiliary: Is mild intrahepatic and extrahepatic biliary duct dilatation. There are multiple stones and sludge within the gallbladder lumen which. There is a filling defect within the proximal common bile duct measuring 8 mm (image 16, series 4) also seen on coronal image 31, series 3. More distal filling defect noted on image 16 and 17 of series 5 measuring 9  and 4 mm. There is no focal hepatic lesion. Along the coronal T2 weighted imaging (series 10) there is 11 mm filling defect in the most distal common bile duct distal to a 9 mm filling defect. This sequence appears best for evaluating these distal filling defects within common bile duct (image 7 through 10 of series 10. Pancreas: The pancreatic parenchyma appears atrophic. The pancreatic duct is not dilated. Spleen: Normal spleen. Adrenals/urinary tract: Adrenal glands normal. Multiple cysts of the kidneys appear benign. Stomach/Bowel: Stomach and limited of the small bowel is unremarkable Vascular/Lymphatic: Abdominal aortic normal caliber. No retroperitoneal periportal lymphadenopathy. Musculoskeletal: No aggressive osseous lesion IMPRESSION: 1. Two large filling defects within the distal common bile duct most consistent choledocholithiasis. 2. Mild extra intrahepatic biliary duct dilatation. 3. Multiple stones and sludge within the gallbladder lumen. 4. Chronic atrophy of the pancreas.  No pancreatic duct dilatation. These results will be called to the ordering clinician or representative by the Radiologist Assistant, and communication documented in the PACS or zVision Dashboard. Electronically Signed   By: Genevive Bi M.D.   On: 05/01/2017 14:27   Mr 3d Recon At Scanner  Result Date: 05/01/2017 CLINICAL DATA:  Sludge in gallbladder. Concern for choledocholithiasis. Mildly elevated bilirubin. Go EXAM: MRI ABDOMEN WITHOUT CONTRAST  (INCLUDING MRCP) TECHNIQUE: Multiplanar multisequence MR imaging of the abdomen was performed. Heavily T2-weighted images of the biliary and pancreatic ducts were obtained, and three-dimensional MRCP images were rendered by post processing. COMPARISON:  CT 04/30/2017, ultrasound 05/01/2017 FINDINGS: Exam is significantly degraded by patient respiratory motion. The MRCP images are particularly affected. Lower chest:  Lung bases are clear. Hepatobiliary: Is mild intrahepatic and  extrahepatic biliary duct dilatation. There are multiple stones and sludge within the gallbladder lumen which. There is a filling defect within the proximal common bile duct measuring 8 mm (image 16, series 4) also seen  on coronal image 31, series 3. More distal filling defect noted on image 16 and 17 of series 5 measuring 9 and 4 mm. There is no focal hepatic lesion. Along the coronal T2 weighted imaging (series 10) there is 11 mm filling defect in the most distal common bile duct distal to a 9 mm filling defect. This sequence appears best for evaluating these distal filling defects within common bile duct (image 7 through 10 of series 10. Pancreas: The pancreatic parenchyma appears atrophic. The pancreatic duct is not dilated. Spleen: Normal spleen. Adrenals/urinary tract: Adrenal glands normal. Multiple cysts of the kidneys appear benign. Stomach/Bowel: Stomach and limited of the small bowel is unremarkable Vascular/Lymphatic: Abdominal aortic normal caliber. No retroperitoneal periportal lymphadenopathy. Musculoskeletal: No aggressive osseous lesion IMPRESSION: 1. Two large filling defects within the distal common bile duct most consistent choledocholithiasis. 2. Mild extra intrahepatic biliary duct dilatation. 3. Multiple stones and sludge within the gallbladder lumen. 4. Chronic atrophy of the pancreas.  No pancreatic duct dilatation. These results will be called to the ordering clinician or representative by the Radiologist Assistant, and communication documented in the PACS or zVision Dashboard. Electronically Signed   By: Genevive Bi M.D.   On: 05/01/2017 14:27   Dg C-arm 1-60 Min-no Report  Result Date: 05/03/2017 Fluoroscopy was utilized by the requesting physician.  No radiographic interpretation.   US Abdomen Limited Ruq  Result Date: 05/01/2017 CLINICAL DATA:  Elevated liver function studies. EXAM: ULTRASOUND ABDOMEN LIMITED RIGHT UPPER QUADRANT COMPARISON:  CT abdomen and pelvis  04/30/2017 FINDINGS: Gallbladder: Multiple stones and sludge demonstrated in the gallbladder. Largest stone measures about 2 cm diameter. No flow is demonstrated within the intraluminal contents. Gallbladder is mildly distended. No wall thickening or edema. Common bile duct: Diameter: 9.2 mm. This represents mild extrahepatic bile duct dilatation. No obstructing stone is demonstrated but the distal common bile duct is obscured by bowel gas. Review of previous CT shows increased density in the distal common bile duct likely representing common duct stones. Mild intrahepatic bile duct dilatation is also demonstrated. Liver: No focal lesion identified. Within normal limits in parenchymal echogenicity. Portal vein is patent on color Doppler imaging with normal direction of blood flow towards the liver. Incidental note of a simple appearing cyst in the right kidney measuring 6.4 cm maximal diameter. IMPRESSION: 1. Cholelithiasis with multiple stones and sludge in the gallbladder. No additional changes to suggest cholecystitis. 2. Dilated intra and extrahepatic bile ducts. Probable choledocholithiasis shown at previous CT. The distal bile duct is not seen on ultrasound due to overlying bowel gas. 3. Benign-appearing cyst in the right kidney. Electronically Signed   By: Burman Nieves M.D.   On: 05/01/2017 03:53       Today   Subjective:   Jack Rios feels well no abdominal pain  Objective:   Blood pressure (!) 141/66, pulse 81, temperature 97.9 F (36.6 C), temperature source Oral, resp. rate 13, height  (1.727 m), weight 130 lb 12.8 oz (59.3 kg), SpO2 98 %.  .  Intake/Output Summary (Last 24 hours) at 05/05/17 1644 Last data filed at 05/05/17 1333  Gross per 24 hour  Intake             1620 ml  Output              155 ml  Net             1465 ml    Exam VITAL SIGNS: Blood pressure (!) 141/66, pulse 81,  temperature 97.9 F (36.6 C), temperature source Oral, resp. rate 13, height   (1.727 m), weight 130 lb 12.8 oz (59.3 kg), SpO2 98 %.  GENERAL:  81 y.o.-year-old patient lying in the bed with no acute distress.  EYES: Pupils equal, round, reactive to light and accommodation. No scleral icterus. Extraocular muscles intact.  HEENT: Head atraumatic, normocephalic. Oropharynx and nasopharynx clear.  NECK:  Supple, no jugular venous distention. No thyroid enlargement, no tenderness.  LUNGS: Normal breath sounds bilaterally, no wheezing, rales,rhonchi or crepitation. No use of accessory muscles of respiration.  CARDIOVASCULAR: S1, S2 normal. No murmurs, rubs, or gallops.  ABDOMEN: Soft, nontender, nondistended. Bowel sounds present. No organomegaly or mass.  EXTREMITIES: No pedal edema, cyanosis, or clubbing.  NEUROLOGIC: Cranial nerves II through XII are intact. Muscle strength 5/5 in all extremities. Sensation intact. Gait not checked.  PSYCHIATRIC: The patient is alert and oriented x 3.  SKIN: No obvious rash, lesion, or ulcer.   Data Review     CBC w Diff: Lab Results  Component Value Date   WBC 6.9 05/03/2017   HGB 11.4 (L) 05/03/2017   HCT 32.2 (L) 05/03/2017   PLT 515 (H) 05/03/2017   LYMPHOPCT 1 04/30/2017   MONOPCT 7 04/30/2017   EOSPCT 0 04/30/2017   BASOPCT 1 04/30/2017   CMP: Lab Results  Component Value Date   NA 143 05/02/2017   K 3.1 (L) 05/02/2017   CL 110 05/02/2017   CO2 27 05/02/2017   BUN 11 05/02/2017   CREATININE 0.71 05/02/2017   PROT 5.8 (L) 05/04/2017   ALBUMIN 3.1 (L) 05/04/2017   BILITOT 0.9 05/04/2017   ALKPHOS 84 05/04/2017   AST 34 05/04/2017   ALT 70 (H) 05/04/2017  .  Micro Results Recent Results (from the past 240 hour(s))  Culture, blood (routine x 2)     Status: None (Preliminary result)   Collection Time: 05/01/17  1:09 AM  Result Value Ref Range Status   Specimen Description BLOOD RIGHT ANTECUBITAL  Final   Special Requests   Final    BOTTLES DRAWN AEROBIC AND ANAEROBIC Blood Culture results may not be optimal  due to an excessive volume of blood received in culture bottles   Culture NO GROWTH 4 DAYS  Final   Report Status PENDING  Incomplete  Culture, blood (routine x 2)     Status: None (Preliminary result)   Collection Time: 05/01/17  1:09 AM  Result Value Ref Range Status   Specimen Description BLOOD RIGHT ANTECUBITAL  Final   Special Requests   Final    BOTTLES DRAWN AEROBIC AND ANAEROBIC Blood Culture adequate volume   Culture NO GROWTH 4 DAYS  Final   Report Status PENDING  Incomplete        Code Status Orders        Start     Ordered   05/01/17 0424  Full code  Continuous     05/01/17 0423    Code Status History    Date Active Date Inactive Code Status Order ID Comments User Context   09/26/2015 12:32 AM 09/26/2015  6:08 PM Full Code [id number]  Ihor Austin, MD ED          Follow-up Information    pcp Follow up in 2 week(s).   Why:  hospital f/u       Ricarda Frame, MD. Go in 1 week(s).   Specialty:  General Surgery Why:  Report to clinic at 9:15 for a 9:30am appointment  next Friday 10/5 Contact information: [address] [place] [address] [phone]           Discharge Medications   Allergies as of 05/05/2017      Reactions   Molds & Smuts       Medication List    TAKE these medications   albuterol 108 (90 Base) MCG/ACT inhaler Commonly known as:  PROVENTIL HFA;VENTOLIN HFA Inhale 2 puffs into the lungs every 4 (four) hours as needed for wheezing or shortness of breath.   ASMANEX 60 METERED DOSES 220 MCG/INH inhaler Generic drug:  mometasone Inhale 2 puffs into the lungs daily.   aspirin 81 MG chewable tablet Chew 81 mg by mouth daily.   chlorpheniramine 4 MG tablet Commonly known as:  CHLOR-TRIMETON Take 4 mg by mouth at bedtime.   docusate sodium 100 MG capsule Commonly known as:  COLACE Take 100 mg by mouth daily.   ferrous sulfate 325 (65 FE) MG tablet Take 325 mg by mouth every evening.   finasteride  5 MG tablet Commonly known as:  PROSCAR Take 5 mg by mouth daily.   fluticasone 50 MCG/ACT nasal spray Commonly known as:  FLONASE Place 2 sprays into both nostrils 2 (two) times daily.   gabapentin 300 MG capsule Commonly known as:  NEURONTIN Take 600 mg by mouth 3 (three) times daily. For nerve irratation   HYDROcodone-acetaminophen 5-325 MG tablet Commonly known as:  NORCO/VICODIN Take 1-2 tablets by mouth every 6 (six) hours as needed for moderate pain or severe pain.   hydroxypropyl methylcellulose / hypromellose 2.5 % ophthalmic solution Commonly known as:  ISOPTO TEARS / GONIOVISC Place 1 drop into both eyes as needed for dry eyes.   hydroxyurea 500 MG capsule Commonly known as:  HYDREA Take 500 mg by mouth at bedtime. May take with food to minimize GI side effects.   ipratropium 0.06 % nasal spray Commonly known as:  ATROVENT Place 2 sprays into both nostrils every 12 (twelve) hours.   pantoprazole 40 MG tablet Commonly known as:  PROTONIX Take 40 mg by mouth 2 (two) times daily.   terazosin 10 MG capsule Commonly known as:  HYTRIN Take 10 mg by mouth at bedtime.   tiotropium 18 MCG inhalation capsule Commonly known as:  SPIRIVA Place 18 mcg into inhaler and inhale daily.   traMADol 50 MG tablet Commonly known as:  ULTRAM Take 50 mg by mouth every 12 (twelve) hours as needed for moderate pain.            Discharge Care Instructions        Start     Ordered   05/05/17 0000  HYDROcodone-acetaminophen (NORCO/VICODIN) 5-325 MG tablet  Every 6 hours PRN     05/05/17 1102         Total Time in preparing paper work, data evaluation and todays exam - 35 minutes  Auburn Bilberry M.D on 05/05/2017 at 4:44 PM  [facility]   Office  [phone]

## 2017-05-05 NOTE — Op Note (Signed)
Laparoscopic Cholecystectomy  Pre-operative Diagnosis: Cholecystitis  Post-operative Diagnosis: Same  Procedure: Laparoscopic cholecystectomy with intraoperative cholangiogram  Surgeon: Leonette Most T. Tonita Cong, MD FACS  Anesthesia: Gen. with endotracheal tube  Assistant: None  Procedure Details  The patient was seen again in the Holding Room. The benefits, complications, treatment options, and expected outcomes were discussed with the patient. The risks of bleeding, infection, recurrence of symptoms, failure to resolve symptoms, bile duct damage, bile duct leak, retained common bile duct stone, bowel injury, any of which could require further surgery and/or ERCP, stent, or papillotomy were reviewed with the patient. The likelihood of improving the patient's symptoms with return to their baseline status is good.  The patient and/or family concurred with the proposed plan, giving informed consent.  The patient was taken to Operating Room, identified as Jack Rios and the procedure verified as Laparoscopic Cholecystectomy.  A Time Out was held and the above information confirmed.  Prior to the induction of general anesthesia, antibiotic prophylaxis was administered. VTE prophylaxis was in place. General endotracheal anesthesia was then administered and tolerated well. After the induction, the abdomen was prepped with Chloraprep and draped in the sterile fashion. The patient was positioned in the supine position.  Local anesthetic was injected into the skin 2 fingerbreadths below the right costal margin in the midclavicular line and an incision made. The Veress needle was placed. Pneumoperitoneum was then created with CO2 and tolerated well without any adverse changes in the patient's vital signs. A 5mm Optiview port was placed in the incision and the abdominal cavity was explored. No evidence of damage to the deep returning structures was noted from either the needle or the Optiview trocar. Two  5-mm ports were placed with one in the periumbilical region and another in the right upper quadrant and a 12 mm epigastric port was placed all under direct vision. All skin incisions  were infiltrated with a local anesthetic agent before making the incision and placing the trocars.   The patient was positioned  in reverse Trendelenburg, tilted slightly to the patient's left.  The gallbladder was identified, the fundus grasped and retracted cephalad. Adhesions were lysed bluntly. The infundibulum was grasped and retracted laterally, exposing the peritoneum overlying the triangle of Calot. This was then divided and exposed in a blunt fashion. A critical view of the cystic duct and cystic artery was obtained.  The cystic duct was clearly identified and bluntly dissected.   At this point we decided to perform a cholangiogram given the recent ERCP. Using a Kumar clamp and catheter access to the gallbladder was easily obtained. Under fluoroscopy the gallbladder and cystic duct were filled with contrast. It was visualized going into the common duct but unfortunately contrast spilled into the peritoneal cavity prior to a complete cholangiogram. Due to the postcontrast the clinic exam was aborted as the needed findings had been seen. There was no obvious evidence of any stones within the cystic duct on the cholangiogram.  The gallbladder was taken from the gallbladder fossa in a retrograde fashion with the electrocautery. The gallbladder was removed and placed in an Endocatch bag. The liver bed was irrigated and inspected. Hemostasis was achieved with the electrocautery. Copious irrigation was utilized and was repeatedly aspirated until clear.  The gallbladder and Endocatch sac were then removed through the epigastric port site.   The epigastric port site had to be enlarged in order for the gallbladder to be brought up. Due to the large nature of the gallbladder it had  to be opened and decompressed with the suction  irrigator prior to being removed from its entirety from the abdomen. Due to this the right upper quadrant and abdominal cavity was reirrigated after it was removed.  Inspection of the right upper quadrant was performed. No bleeding, bile duct injury or leak, or bowel injury was noted. Pneumoperitoneum was released.  The epigastric port site was closed with two figure-of-eight 0 Vicryl sutures. 4-0 subcuticular Monocryl was used to close the skin. Steristrips and Mastisol and sterile dressings were  applied.  The patient was then extubated and brought to the recovery room in stable condition. Sponge, lap, and needle counts were correct at closure and at the conclusion of the case.   Findings: Acute Cholecystitis   Estimated Blood Loss: 20 mL         Drains: None         Specimens: Gallbladder           Complications: none               Andra Matsuo T. Tonita Cong, MD, FACS

## 2017-05-05 NOTE — Transfer of Care (Signed)
Immediate Anesthesia Transfer of Care Note  Patient: Jack Rios  Procedure(s) Performed: Procedure(s): LAPAROSCOPIC CHOLECYSTECTOMY WITH INTRAOPERATIVE CHOLANGIOGRAM (N/A)  Patient Location: PACU  Anesthesia Type:General  Level of Consciousness: sedated  Airway & Oxygen Therapy: Patient Spontanous Breathing and Patient connected to face mask oxygen  Post-op Assessment: Report given to RN and Post -op Vital signs reviewed and stable  Post vital signs: Reviewed and stable  Last Vitals:  Vitals:   05/04/17 2052 05/05/17 0508  BP: (!) 161/83 (!) 150/73  Pulse: 80 73  Resp: 20 20  Temp: 36.9 C 36.7 C  SpO2: 100% 97%    Last Pain:  Vitals:   05/05/17 0508  TempSrc: Oral  PainSc:          Complications: No apparent anesthesia complications

## 2017-05-05 NOTE — Anesthesia Procedure Notes (Signed)
Procedure Name: Intubation Date/Time: 05/05/2017 7:42 AM Performed by: Doreen Salvage Pre-anesthesia Checklist: Patient identified, Patient being monitored, Timeout performed, Emergency Drugs available and Suction available Patient Re-evaluated:Patient Re-evaluated prior to induction Oxygen Delivery Method: Circle system utilized Preoxygenation: Pre-oxygenation with 100% oxygen Induction Type: IV induction Ventilation: Mask ventilation without difficulty Laryngoscope Size: Mac, 4 and Glidescope Grade View: Grade I Tube type: Oral Tube size: 7.0 mm Number of attempts: 1 Airway Equipment and Method: Stylet and Rigid stylet Placement Confirmation: ETT inserted through vocal cords under direct vision,  positive ETCO2 and breath sounds checked- equal and bilateral Secured at: 22 cm Tube secured with: Tape Dental Injury: Teeth and Oropharynx as per pre-operative assessment  Difficulty Due To: Difficulty was anticipated, Difficult Airway- due to reduced neck mobility, Difficult Airway- due to dentition and Difficult Airway- due to anterior larynx Comments: Elective DL with  Glidescope due to the patient's limited neck mobility, prominent incisors, and anterior airway

## 2017-05-05 NOTE — Anesthesia Post-op Follow-up Note (Signed)
Anesthesia QCDR form completed.        

## 2017-05-05 NOTE — Progress Notes (Signed)
  Revisited with patient.  He is doing well and tolerating his diet  Discussed his post operative care and arranged his follow up with me in clinic for next week.  OK for discharge from a surgical stand point.  Ricarda Frame, MD [facility] General Surgeon [facility]  Day [facility] [id number] Night [facility] [phone]

## 2017-05-05 NOTE — Progress Notes (Signed)
Pt for discharge home. Dr Tonita Cong saw this pm. A/o.  No pain at present/ but soreness.  Instructions to pt and wife and dtr. presc given  And discussed and home meds discussed. Diet/ activity  F/u and post op instructions given. Verbalize understanding.pt eating then will be ready for discharge

## 2017-05-05 NOTE — Anesthesia Postprocedure Evaluation (Signed)
Anesthesia Post Note  Patient: Jack Rios  Procedure(s) Performed: Procedure(s) (LRB): LAPAROSCOPIC CHOLECYSTECTOMY WITH INTRAOPERATIVE CHOLANGIOGRAM (N/A)  Patient location during evaluation: PACU Anesthesia Type: General Level of consciousness: awake and alert Pain management: pain level controlled Vital Signs Assessment: post-procedure vital signs reviewed and stable Respiratory status: spontaneous breathing, nonlabored ventilation, respiratory function stable and patient connected to nasal cannula oxygen Cardiovascular status: blood pressure returned to baseline and stable Postop Assessment: no apparent nausea or vomiting Anesthetic complications: no     Last Vitals:  Vitals:   05/05/17 1000 05/05/17 1018  BP:  (!) 141/66  Pulse:  81  Resp:    Temp: 36.6 C 36.6 C  SpO2:  98%    Last Pain:  Vitals:   05/05/17 1055  TempSrc:   PainSc: 8                  Joseph K Piscitello

## 2017-05-05 NOTE — Discharge Instructions (Addendum)
[facility] - [place] at [facility]  DIET:  Low fat, Low cholesterol diet  DISCHARGE CONDITION:  Stable  ACTIVITY:  Activity as tolerated  OXYGEN:  Home Oxygen: No.   Oxygen Delivery: room air  DISCHARGE LOCATION:  home    ADDITIONAL DISCHARGE INSTRUCTION:   If you experience worsening of your admission symptoms, develop shortness of breath, life threatening emergency, suicidal or homicidal thoughts you must seek medical attention immediately by calling 911 or calling your MD immediately  if symptoms less severe.  You Must read complete instructions/literature along with all the possible adverse reactions/side effects for all the Medicines you take and that have been prescribed to you. Take any new Medicines after you have completely understood and accpet all the possible adverse reactions/side effects.   Please note  You were cared for by a hospitalist during your hospital stay. If you have any questions about your discharge medications or the care you received while you were in the hospital after you are discharged, you can call the unit and asked to speak with the hospitalist on call if the hospitalist that took care of you is not available. Once you are discharged, your primary care physician will handle any further medical issues. Please note that NO REFILLS for any discharge medications will be authorized once you are discharged, as it is imperative that you return to your primary care physician (or establish a relationship with a primary care physician if you do not have one) for your aftercare needs so that they can reassess your need for medications and monitor your lab values.   Laparoscopic Cholecystectomy, Care After This sheet gives you information about how to care for yourself after your procedure. Your health care provider may also give you more specific instructions. If you have problems or questions, contact your health care provider. What can I  expect after the procedure? After the procedure, it is common to have:  Pain at your incision sites. You will be given medicines to control this pain.  Mild nausea or vomiting.  Bloating and possible shoulder pain from the air-like gas that was used during the procedure.  Follow these instructions at home: Incision care   Follow instructions from your health care provider about how to take care of your incisions. Make sure you: ? Wash your hands with soap and water before you change your bandage (dressing). If soap and water are not available, use hand sanitizer. ? Change your dressing as told by your health care provider. Change first dressing 48 hours after surgery. Leave strips in place. Replace dressings with large band-aids until there is no drainage. ? Leave stitches (sutures), skin glue, or adhesive strips in place. These skin closures may need to be in place for 2 weeks or longer. If adhesive strip edges start to loosen and curl up, you may trim the loose edges. Do not remove adhesive strips completely unless your health care provider tells you to do that.  Do not take baths, swim, or use a hot tub until your health care provider approves. Ask your health care provider if you can take showers. You may only be allowed to take sponge baths for bathing. OK to shower in 24 hours.  Check your incision area every day for signs of infection. Check for: ? More redness, swelling, or pain. ? More fluid or blood. ? Warmth. ? Pus or a bad smell. Activity  Do not drive or use heavy machinery while taking prescription pain medicine.  Do not lift anything that is heavier than 10 lb (4.5 kg) until your health care provider approves.  Do not play contact sports until your health care provider approves.  Do not drive for 24 hours if you were given a medicine to help you relax (sedative).  Rest as needed. Do not return to work or school until your health care provider approves. General  instructions  Take over-the-counter and prescription medicines only as told by your health care provider.  To prevent or treat constipation while you are taking prescription pain medicine, your health care provider may recommend that you: ? Drink enough fluid to keep your urine clear or pale yellow. ? Take over-the-counter or prescription medicines. ? Eat foods that are high in fiber, such as fresh fruits and vegetables, whole grains, and beans. ? Limit foods that are high in fat and processed sugars, such as fried and sweet foods. Contact a health care provider if:  You develop a rash.  You have more redness, swelling, or pain around your incisions.  You have more fluid or blood coming from your incisions.  Your incisions feel warm to the touch.  You have pus or a bad smell coming from your incisions.  You have a fever.  One or more of your incisions breaks open. Get help right away if:  You have trouble breathing.  You have chest pain.  You have increasing pain in your shoulders.  You faint or feel dizzy when you stand.  You have severe pain in your abdomen.  You have nausea or vomiting that lasts for more than one day.  You have leg pain. This information is not intended to replace advice given to you by your health care provider. Make sure you discuss any questions you have with your health care provider. Document Released: 07/27/2005 Document Revised: 02/15/2016 Document Reviewed: 01/13/2016 Elsevier Interactive Patient Education  2017 [employer].

## 2017-05-06 ENCOUNTER — Encounter: Payer: Self-pay | Admitting: General Surgery

## 2017-05-06 ENCOUNTER — Telehealth: Payer: Self-pay

## 2017-05-06 LAB — SURGICAL PATHOLOGY

## 2017-05-06 LAB — CULTURE, BLOOD (ROUTINE X 2)
CULTURE: NO GROWTH
Culture: NO GROWTH
SPECIAL REQUESTS: ADEQUATE

## 2017-05-06 NOTE — Telephone Encounter (Signed)
Post-op call made to patient at this time. No answer. Unable to leave voicemail. Will call patient again at a later time.

## 2017-05-06 NOTE — Anesthesia Postprocedure Evaluation (Signed)
Anesthesia Post Note  Patient: CORLISS LAMARTINA  Procedure(s) Performed: Procedure(s) (LRB): ENDOSCOPIC RETROGRADE CHOLANGIOPANCREATOGRAPHY (ERCP) WITH PROPOFOL (N/A)  Patient location during evaluation: PACU Anesthesia Type: General Level of consciousness: awake and alert Pain management: pain level controlled Vital Signs Assessment: post-procedure vital signs reviewed and stable Respiratory status: spontaneous breathing, nonlabored ventilation, respiratory function stable and patient connected to nasal cannula oxygen Cardiovascular status: blood pressure returned to baseline and stable Postop Assessment: no apparent nausea or vomiting Anesthetic complications: no     Last Vitals:  Vitals:   05/05/17 1018 05/05/17 1037  BP: (!) 141/66   Pulse: 81   Resp:    Temp: 36.6 C   SpO2: 98% 98%    Last Pain:  Vitals:   05/05/17 1055  TempSrc:   PainSc: 8                  Yevette Edwards

## 2017-05-10 NOTE — Telephone Encounter (Signed)
Call made once again to patient today. No answer. Will follow-up with patient at Post-op appointment scheduled 05/14/17.

## 2017-05-14 ENCOUNTER — Ambulatory Visit (INDEPENDENT_AMBULATORY_CARE_PROVIDER_SITE_OTHER): Payer: Medicare Other | Admitting: General Surgery

## 2017-05-14 ENCOUNTER — Encounter: Payer: Self-pay | Admitting: General Surgery

## 2017-05-14 VITALS — BP 128/70 | HR 68 | Temp 97.7°F | Ht 68.0 in | Wt 133.0 lb

## 2017-05-14 DIAGNOSIS — Z4889 Encounter for other specified surgical aftercare: Secondary | ICD-10-CM

## 2017-05-14 NOTE — Progress Notes (Signed)
Outpatient Surgical Follow Up  05/14/2017  Jack Rios is an 81 y.o. male.   Chief Complaint  Patient presents with  . Routine Post Op    Post-op: Laparoscopic Cholecystectomy with IOC (05/05/17)- Dr. Tonita Cong    HPI: 81 year old male returns to clinic for follow-up now 10 days status post laparoscopic cholecystectomy. Patient reports that he has done well and has not required any narcotic pain medicine. He is eating well but has had decreased bowel function. He denies any fevers, chills, nausea, vomiting, chest pain, shortness of breath. His chronic abdominal pain that he had bleeding into his hospitalization appears to resolve.  Past Medical History:  Diagnosis Date  . Arthritis   . Asthma   . Biliary sludge determined by ultrasound   . Calculus of bile duct with acute cholangitis with obstruction   . Calculus of gallbladder and bile duct without cholecystitis, with obstruction   . Calculus of gallbladder without cholecystitis without obstruction   . Choledocholithiasis   . Common bile duct dilatation   . Fever   . Jaundice   . Lung nodule   . Neuropathy   . Peptic ulcer   . Pneumonia 09/26/2015  . Protein-calorie malnutrition, severe 09/26/2015  . Stenosis of duodenum   . Transaminitis 05/01/2017    Past Surgical History:  Procedure Laterality Date  . CERVICAL SPINE SURGERY    . CHOLECYSTECTOMY N/A 05/05/2017   Procedure: LAPAROSCOPIC CHOLECYSTECTOMY WITH INTRAOPERATIVE CHOLANGIOGRAM;  Surgeon: Ricarda Frame, MD;  Location: [facility] ORS;  Service: General;  Laterality: N/A;  . ENDOSCOPIC RETROGRADE CHOLANGIOPANCREATOGRAPHY (ERCP) WITH PROPOFOL N/A 05/03/2017   Procedure: ENDOSCOPIC RETROGRADE CHOLANGIOPANCREATOGRAPHY (ERCP) WITH PROPOFOL;  Surgeon: Midge Minium, MD;  Location: [facility] ENDOSCOPY;  Service: Endoscopy;  Laterality: N/A;    Family History  Problem Relation Age of Onset  . Arthritis Brother     Social History:  reports that he has quit smoking. His smoking use  included Cigarettes. He has never used smokeless tobacco. He reports that he does not drink alcohol or use drugs.  Allergies:  Allergies  Allergen Reactions  . Molds & Smuts     Medications reviewed.    ROS A multipoint review of systems was completed, all pertinent positives and negatives are documented within the history of present illness and remainder are negative   BP 128/70   Pulse 68   Temp 97.7 F (36.5 C) (Oral)   Ht  (1.727 m)   Wt 60.3 kg (133 lb)   BMI 20.22 kg/m   Physical Exam Gen.: No acute distress  chest: Clear to auscultation  heart: Regular rate and rhythm Abdomen: Soft and nontender. Dressings in place was lap scopic surgery sites that were placed in the OR 10 days ago.    No results found for this or any previous visit (from the past 48 hour(s)). No results found.  Assessment/Plan:  1. Aftercare following surgery 81 year old male status post laparoscopic cholecystectomy. Pathology reviewed with the patient. Discussed appropriate return to normal activities. Dressings removed and instructed him on appropriate bathing and anticipated wound recovery. All questions answered to the patient's satisfaction and they will follow up on an as-needed basis.     Ricarda Frame, MD FACS General Surgeon  05/14/2017,10:33 AM

## 2017-05-14 NOTE — Patient Instructions (Addendum)
GENERAL POST-OPERATIVE PATIENT INSTRUCTIONS   WOUND CARE INSTRUCTIONS:  Keep a dry clean dressing on the wound if there is drainage. The initial bandage may be removed after 24 hours.  Once the wound has quit draining you may leave it open to air.  If clothing rubs against the wound or causes irritation and the wound is not draining you may cover it with a dry dressing during the daytime.  Try to keep the wound dry and avoid ointments on the wound unless directed to do so.  If the wound becomes bright red and painful or starts to drain infected material that is not clear, please contact your physician immediately.  If the wound is mildly pink and has a thick firm ridge underneath it, this is normal, and is referred to as a healing ridge.  This will resolve over the next 4-6 weeks.  BATHING: You may shower if you have been informed of this by your surgeon. However, Please do not submerge in a tub, hot tub, or pool until incisions are completely sealed or have been told by your surgeon that you may do so.  DIET:  You may eat any foods that you can tolerate.  It is a good idea to eat a high fiber diet and take in plenty of fluids to prevent constipation.  If you do become constipated you may want to take a mild laxative or take ducolax tablets on a daily basis until your bowel habits are regular.  Constipation can be very uncomfortable, along with straining, after recent surgery.  ACTIVITY:  You are encouraged to cough and deep breath or use your incentive spirometer if you were given one, every 15-30 minutes when awake.  This will help prevent respiratory complications and low grade fevers post-operatively if you had a general anesthetic.  You may want to hug a pillow when coughing and sneezing to add additional support to the surgical area, if you had abdominal or chest surgery, which will decrease pain during these times.  You are encouraged to walk and engage in light activity for the next two weeks.  You  should not lift more than 20 pounds, until 06/16/2017 as it could put you at increased risk for complications.  Twenty pounds is roughly equivalent to a plastic bag of groceries. At that time- Listen to your body when lifting, if you have pain when lifting, stop and then try again in a few days. Soreness after doing exercises or activities of daily living is normal as you get back in to your normal routine.  MEDICATIONS:  Try to take narcotic medications and anti-inflammatory medications, such as tylenol, ibuprofen, naprosyn, etc., with food.  This will minimize stomach upset from the medication.  Should you develop nausea and vomiting from the pain medication, or develop a rash, please discontinue the medication and contact your physician.  You should not drive, make important decisions, or operate machinery when taking narcotic pain medication.  SUNBLOCK Use sun block to incision area over the next year if this area will be exposed to sun. This helps decrease scarring and will allow you avoid a permanent darkened area over your incision.  QUESTIONS:  Please feel free to call our office if you have any questions, and we will be glad to assist you. [phone]    Polyethylene Glycol powder What is this medicine? POLYETHYLENE GLYCOL 3350 (pol ee ETH i leen; GLYE col) powder is a laxative used to treat constipation. It increases the amount of water in  the stool. Bowel movements become easier and more frequent. This medicine may be used for other purposes; ask your health care provider or pharmacist if you have questions. COMMON BRAND NAME(S): Tish Men, GlycoLax, MiraLax, Smooth LAX, [employer] What should I tell my health care provider before I take this medicine? They need to know if you have any of these conditions: -a history of blockage of the stomach or intestine -current abdomen distension or pain -difficulty swallowing -diverticulitis, ulcerative colitis, or other chronic bowel  disease -phenylketonuria -an unusual or allergic reaction to polyethylene glycol, other medicines, dyes, or preservatives -pregnant or trying to get pregnant -breast-feeding How should I use this medicine? Take this medicine by mouth. The bottle has a measuring cap that is marked with a line. Pour the powder into the cap up to the marked line (the dose is about 1 heaping tablespoon). Add the powder in the cap to a full glass (4 to 8 ounces or 120 to 240 ml) of water, juice, soda, coffee or tea. Mix the powder well. Drink the solution. Take exactly as directed. Do not take your medicine more often than directed. Talk to your pediatrician regarding the use of this medicine in children. Special care may be needed. Overdosage: If you think you have taken too much of this medicine contact a poison control center or emergency room at once. NOTE: This medicine is only for you. Do not share this medicine with others. What if I miss a dose? If you miss a dose, take it as soon as you can. If it is almost time for your next dose, take only that dose. Do not take double or extra doses. What may interact with this medicine? Interactions are not expected. This list may not describe all possible interactions. Give your health care provider a list of all the medicines, herbs, non-prescription drugs, or dietary supplements you use. Also tell them if you smoke, drink alcohol, or use illegal drugs. Some items may interact with your medicine. What should I watch for while using this medicine? Do not use for more than 2 weeks without advice from your doctor or health care professional. It can take 2 to 4 days to have a bowel movement and to experience improvement in constipation. See your health care professional for any changes in bowel habits, including constipation, that are severe or last longer than three weeks. Always take this medicine with plenty of water. What side effects may I notice from receiving this  medicine? Side effects that you should report to your doctor or health care professional as soon as possible: -diarrhea -difficulty breathing -itching of the skin, hives, or skin rash -severe bloating, pain, or distension of the stomach -vomiting Side effects that usually do not require medical attention (report to your doctor or health care professional if they continue or are bothersome): -bloating or gas -lower abdominal discomfort or cramps -nausea This list may not describe all possible side effects. Call your doctor for medical advice about side effects. You may report side effects to FDA at 1-800-FDA-1088. Where should I keep my medicine? Keep out of the reach of children. Store between 15 and 30 degrees C (59 and 86 degrees F). Throw away any unused medicine after the expiration date. NOTE: This sheet is a summary. It may not cover all possible information. If you have questions about this medicine, talk to your doctor, pharmacist, or health care provider.  2018 Elsevier/Gold Standard (2008-02-27 16:50:45)

## 2017-05-17 ENCOUNTER — Telehealth: Payer: Self-pay | Admitting: General Practice

## 2017-05-17 NOTE — Telephone Encounter (Signed)
Spoke with patient at this time. He states there is a small clear bubble near his navel that itches.  He denies fever, chills nausea or vomiting at this time. No redness.  He was instructed to monitor it over the next day or so and if redness or feels hot to touch or develops fever to call our office. Otherwise keep area clean and dry.

## 2017-05-17 NOTE — Telephone Encounter (Signed)
Tried to call patient back , home and work number,  however no answer.

## 2017-05-17 NOTE — Telephone Encounter (Signed)
Patient called said he has a little clear bubble that has come out around his navel. He is asking can he pop it and drain what's in the bubble. Please call patient and advice.

## 2017-05-19 ENCOUNTER — Telehealth: Payer: Self-pay

## 2017-05-19 NOTE — Telephone Encounter (Addendum)
Patient's wife called at this time and stated that her husband has a "bubble" near his belly button and his mid-line insicion. She stated that it has become bigger since speaking with a nurse on Monday. She stated it started as a dime size and now it is the size of a quarter. Denies redness, fever/chills, nausea/vomiting. I asked if it was warm to the touch. She stated she wasn't sure and told me that she would find out. Once she did she yelled "Uh oh it popped" I asked if there was drainage from it. She stated there was a pink blood like drainage. I told her to apply a little pressure clean the area with water and apply a band-aid. She asked if she needed to apply any alcohol or peroxide to the area. I informed her that she didn't, to monitor the area and keep it clean and dry.  Advised that if he starts to have fever/chills, nausea/vomiting, pus drainage from that area to give [place] a call so that he can be seen. She verbalized understanding.

## 2019-07-13 ENCOUNTER — Other Ambulatory Visit: Payer: Self-pay

## 2019-07-13 DIAGNOSIS — Z20822 Contact with and (suspected) exposure to covid-19: Secondary | ICD-10-CM

## 2019-07-16 LAB — NOVEL CORONAVIRUS, NAA: SARS-CoV-2, NAA: NOT DETECTED

## 2019-09-13 ENCOUNTER — Other Ambulatory Visit: Payer: Self-pay | Admitting: Cardiothoracic Surgery

## 2019-09-13 ENCOUNTER — Other Ambulatory Visit (HOSPITAL_COMMUNITY): Payer: Self-pay | Admitting: Cardiothoracic Surgery

## 2019-09-13 DIAGNOSIS — R911 Solitary pulmonary nodule: Secondary | ICD-10-CM

## 2019-09-21 ENCOUNTER — Ambulatory Visit
Admission: RE | Admit: 2019-09-21 | Discharge: 2019-09-21 | Disposition: A | Discharge status: Home / Self Care | Payer: Non-veteran care | Source: Ambulatory Visit | Attending: Cardiothoracic Surgery | Admitting: Cardiothoracic Surgery

## 2019-09-21 ENCOUNTER — Other Ambulatory Visit: Payer: Self-pay

## 2019-09-21 DIAGNOSIS — R911 Solitary pulmonary nodule: Secondary | ICD-10-CM

## 2019-09-25 ENCOUNTER — Ambulatory Visit
Admission: RE | Admit: 2019-09-25 | Discharge: 2019-09-25 | Disposition: A | Discharge status: Home / Self Care | Payer: No Typology Code available for payment source | Source: Ambulatory Visit | Attending: Cardiothoracic Surgery | Admitting: Cardiothoracic Surgery

## 2019-09-25 ENCOUNTER — Other Ambulatory Visit: Payer: Self-pay

## 2019-09-25 DIAGNOSIS — N281 Cyst of kidney, acquired: Secondary | ICD-10-CM | POA: Diagnosis not present

## 2019-09-25 DIAGNOSIS — R911 Solitary pulmonary nodule: Secondary | ICD-10-CM | POA: Insufficient documentation

## 2019-09-25 DIAGNOSIS — I700 Atherosclerosis of aorta: Secondary | ICD-10-CM | POA: Diagnosis not present

## 2019-09-25 LAB — GLUCOSE, CAPILLARY: Glucose-Capillary: 80 mg/dL (ref 70–99)

## 2019-09-25 MED ORDER — FLUDEOXYGLUCOSE F - 18 (FDG) INJECTION
7.4000 | Freq: Once | INTRAVENOUS | Status: AC | PRN
  Administered 2019-09-25: 7.4 via INTRAVENOUS

## 2020-12-26 IMAGING — CT NM PET TUM IMG INITIAL (PI) SKULL BASE T - THIGH
1 of 9 series · 1 of 25 positions shown · non-contrast
Comparison: None.

CLINICAL DATA: Initial treatment strategy for lung nodule.

EXAM:
NUCLEAR MEDICINE PET SKULL BASE TO THIGH
TECHNIQUE: 7.4 mCi F-18 FDG was injected intravenously. Full-ring PET imaging
was performed from the skull base to thigh after the radiotracer. CT
data was obtained and used for attenuation correction and anatomic
localization.
Fasting blood glucose: 80 mg/dl

[Series 3: ct wb 5.0 b30f · axial · 5.0mm · 0.98mm/px · 1 of 329 slices shown]
[im 329/329  brain]
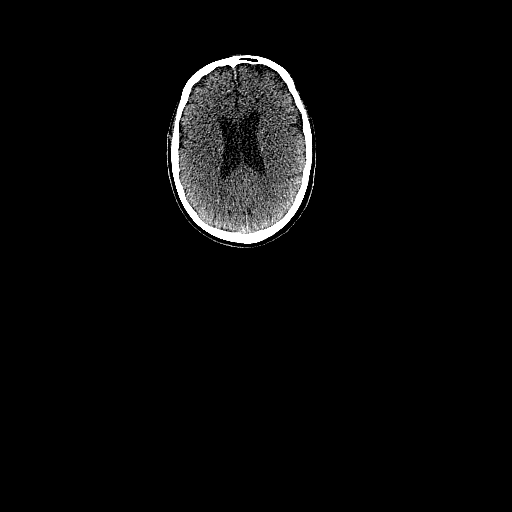

[1 of 25 positions shown; findings below may reference images not displayed]

FINDINGS: Mediastinal blood pool activity: SUV max

Liver activity: SUV max NA

NECK: No hypermetabolic lymph nodes in the neck.

Incidental CT findings: none

CHEST: No axillary, supraclavicular, mediastinal or hilar FDG avid
lymph nodes identified.

No pleural effusion. Cystic and solid nodule within the anteromedial
left upper lobe measures 1.2 cm and has an SUV max of 1.26. 4 mm
nodules within the anterolateral right lower lobe and lateral right
middle lobe are identified, too small to characterize by PET-CT. No
FDG avid pulmonary nodules identified at this time.

Incidental CT findings: Aortic atherosclerosis. Lad coronary artery
calcifications.

ABDOMEN/PELVIS: No abnormal hypermetabolic activity within the
liver, pancreas, adrenal glands, or spleen. No hypermetabolic lymph
nodes in the abdomen or pelvis.

Incidental CT findings: Cholecystectomy and pneumobilia. Bilateral
kidney cysts identified. Complex cyst containing calcification
arises from upper pole of the right kidney measuring 1.6 cm, image
161/3.

SKELETON: No focal hypermetabolic activity to suggest skeletal
metastasis.

Incidental CT findings: Scoliosis and degenerative disc disease. T10
vertebral hemangioma.
IMPRESSION: 1. No significant FDG uptake associated with the cystic and solid
nodule within the left upper lobe. This favors of benign process.
However, as certain low-grade pulmonary neoplasms such as pulmonary
adenocarcinoma may not exhibit significant FDG uptake follow-up
imaging is recommended. Consider repeat [HOSPITAL] 6-12 months and
then at 18-24 months to establish 24 months of stability.
2. Bilateral kidney cysts with complex cyst in upper pole of right
kidney. These are incompletely characterized without IV contrast
3.  Aortic Atherosclerosis ([id number].I).

## 2022-08-08 ENCOUNTER — Emergency Department: Payer: No Typology Code available for payment source

## 2022-08-08 ENCOUNTER — Inpatient Hospital Stay
Admission: EM | Admit: 2022-08-08 | Discharge: 2022-08-14 | DRG: 193 | Disposition: A | Discharge status: Home Health | Payer: No Typology Code available for payment source | Attending: Internal Medicine | Admitting: Internal Medicine

## 2022-08-08 DIAGNOSIS — Z1152 Encounter for screening for COVID-19: Secondary | ICD-10-CM

## 2022-08-08 DIAGNOSIS — J1008 Influenza due to other identified influenza virus with other specified pneumonia: Principal | ICD-10-CM | POA: Diagnosis present

## 2022-08-08 DIAGNOSIS — R531 Weakness: Secondary | ICD-10-CM

## 2022-08-08 DIAGNOSIS — E876 Hypokalemia: Secondary | ICD-10-CM | POA: Diagnosis present

## 2022-08-08 DIAGNOSIS — J101 Influenza due to other identified influenza virus with other respiratory manifestations: Principal | ICD-10-CM

## 2022-08-08 DIAGNOSIS — D509 Iron deficiency anemia, unspecified: Secondary | ICD-10-CM | POA: Diagnosis present

## 2022-08-08 DIAGNOSIS — E785 Hyperlipidemia, unspecified: Secondary | ICD-10-CM | POA: Insufficient documentation

## 2022-08-08 DIAGNOSIS — Z87891 Personal history of nicotine dependence: Secondary | ICD-10-CM

## 2022-08-08 DIAGNOSIS — I10 Essential (primary) hypertension: Secondary | ICD-10-CM

## 2022-08-08 DIAGNOSIS — F0390 Unspecified dementia without behavioral disturbance: Secondary | ICD-10-CM | POA: Insufficient documentation

## 2022-08-08 DIAGNOSIS — Z79899 Other long term (current) drug therapy: Secondary | ICD-10-CM

## 2022-08-08 DIAGNOSIS — J45909 Unspecified asthma, uncomplicated: Secondary | ICD-10-CM | POA: Insufficient documentation

## 2022-08-08 DIAGNOSIS — K219 Gastro-esophageal reflux disease without esophagitis: Secondary | ICD-10-CM | POA: Insufficient documentation

## 2022-08-08 DIAGNOSIS — Z8261 Family history of arthritis: Secondary | ICD-10-CM

## 2022-08-08 DIAGNOSIS — R0902 Hypoxemia: Secondary | ICD-10-CM

## 2022-08-08 DIAGNOSIS — Z7982 Long term (current) use of aspirin: Secondary | ICD-10-CM

## 2022-08-08 DIAGNOSIS — Z66 Do not resuscitate: Secondary | ICD-10-CM | POA: Diagnosis present

## 2022-08-08 DIAGNOSIS — J440 Chronic obstructive pulmonary disease with acute lower respiratory infection: Secondary | ICD-10-CM | POA: Diagnosis present

## 2022-08-08 DIAGNOSIS — R911 Solitary pulmonary nodule: Secondary | ICD-10-CM | POA: Diagnosis present

## 2022-08-08 DIAGNOSIS — Z23 Encounter for immunization: Secondary | ICD-10-CM

## 2022-08-08 DIAGNOSIS — J159 Unspecified bacterial pneumonia: Secondary | ICD-10-CM | POA: Diagnosis present

## 2022-08-08 DIAGNOSIS — D7589 Other specified diseases of blood and blood-forming organs: Secondary | ICD-10-CM | POA: Diagnosis present

## 2022-08-08 DIAGNOSIS — J9601 Acute respiratory failure with hypoxia: Secondary | ICD-10-CM | POA: Diagnosis present

## 2022-08-08 DIAGNOSIS — N400 Enlarged prostate without lower urinary tract symptoms: Secondary | ICD-10-CM | POA: Insufficient documentation

## 2022-08-08 DIAGNOSIS — R338 Other retention of urine: Secondary | ICD-10-CM | POA: Diagnosis present

## 2022-08-08 DIAGNOSIS — N401 Enlarged prostate with lower urinary tract symptoms: Secondary | ICD-10-CM | POA: Diagnosis present

## 2022-08-08 LAB — CBC WITH DIFFERENTIAL/PLATELET

## 2022-08-08 NOTE — ED Provider Notes (Incomplete)
   [facility] Provider Note    Event Date/Time   First MD Initiated Contact with Patient 08/08/22 2329     (approximate)   History   Weakness   HPI  Jack Rios is a 86 y.o. male who presents to the ED for evaluation of Weakness   Patient presents from home via EMS for evaluation of generalized weakness and worsening "sickness."  He has reportedly been sick for the past 5 days, but he and his wife did not want to come to the hospital due to the prolonged wait times.  He denies any focal weakness.  Denies any focal pain.  Reports generalized malaise and myalgias without any falls.    Physical Exam   Triage Vital Signs: ED Triage Vitals  Enc Vitals Group     BP 08/08/22 2319 (!) 135/92     Pulse Rate 08/08/22 2319 (!) 102     Resp 08/08/22 2319 (!) 24     Temp 08/08/22 2319 98 F (36.7 C)     Temp Source 08/08/22 2319 Oral     SpO2 08/08/22 2319 95 %     Weight 08/08/22 2321 190 lb (86.2 kg)     Height 08/08/22 2321 5\' 10"  (1.778 m)     Head Circumference --      Peak Flow --      Pain Score 08/08/22 2320 0     Pain Loc --      Pain Edu? --      Excl. in GC? --     Most recent vital signs: Vitals:   08/08/22 2319  BP: (!) 135/92  Pulse: (!) 102  Resp: (!) 24  Temp: 98 F (36.7 C)  SpO2: 95%    General: Awake, no distress. *** CV:  Good peripheral perfusion.  Resp:  Normal effort.  Abd:  No distention.  MSK:  No deformity noted.  Neuro:  No focal deficits appreciated. Other:     ED Results / Procedures / Treatments   Labs (all labs ordered are listed, but only abnormal results are displayed) Labs Reviewed  RESP PANEL BY RT-PCR (RSV, FLU A&B, COVID)  [id number]  CULTURE, BLOOD (ROUTINE X 2)  CULTURE, BLOOD (ROUTINE X 2)  URINE CULTURE  PROCALCITONIN  PROCALCITONIN  LACTIC ACID, PLASMA  LACTIC ACID, PLASMA  COMPREHENSIVE METABOLIC PANEL  CBC WITH DIFFERENTIAL/PLATELET  PROTIME-INR  APTT  URINALYSIS, COMPLETE (UACMP)  WITH MICROSCOPIC    EKG Sinus tachycardia with rate of 101 bpm.  Leftward axis.  Incomplete bundle.  No STEMI.  RADIOLOGY ***  Official radiology report(s): No results found.  PROCEDURES and INTERVENTIONS:  Procedures  Medications - No data to display   IMPRESSION / MDM / ASSESSMENT AND PLAN / ED COURSE  I reviewed the triage vital signs and the nursing notes.  Differential diagnosis includes, but is not limited to, ***  {Patient presents with symptoms of an acute illness or injury that is potentially life-threatening.}      FINAL CLINICAL IMPRESSION(S) / ED DIAGNOSES   Final diagnoses:  None     Rx / DC Orders   ED Discharge Orders     None        Note:  This document was prepared using Dragon voice recognition software and may include unintentional dictation errors.

## 2022-08-08 NOTE — ED Triage Notes (Signed)
Pt from home for generalized weakness, /N /V.  Pt sick Christmas day and continued to decline.  Normally ambulatory pt became full care tonight.  Baseline dementia currently oriented to self and time.  89% RA per EMS.

## 2022-08-09 ENCOUNTER — Emergency Department: Payer: No Typology Code available for payment source

## 2022-08-09 ENCOUNTER — Encounter: Payer: Self-pay | Admitting: Family Medicine

## 2022-08-09 ENCOUNTER — Other Ambulatory Visit: Payer: Self-pay

## 2022-08-09 DIAGNOSIS — Z7982 Long term (current) use of aspirin: Secondary | ICD-10-CM | POA: Diagnosis not present

## 2022-08-09 DIAGNOSIS — D7589 Other specified diseases of blood and blood-forming organs: Secondary | ICD-10-CM | POA: Diagnosis present

## 2022-08-09 DIAGNOSIS — J1008 Influenza due to other identified influenza virus with other specified pneumonia: Secondary | ICD-10-CM | POA: Diagnosis not present

## 2022-08-09 DIAGNOSIS — Z66 Do not resuscitate: Secondary | ICD-10-CM | POA: Diagnosis present

## 2022-08-09 DIAGNOSIS — R531 Weakness: Secondary | ICD-10-CM

## 2022-08-09 DIAGNOSIS — Z79899 Other long term (current) drug therapy: Secondary | ICD-10-CM | POA: Diagnosis not present

## 2022-08-09 DIAGNOSIS — K219 Gastro-esophageal reflux disease without esophagitis: Secondary | ICD-10-CM | POA: Diagnosis present

## 2022-08-09 DIAGNOSIS — N401 Enlarged prostate with lower urinary tract symptoms: Secondary | ICD-10-CM | POA: Diagnosis present

## 2022-08-09 DIAGNOSIS — J440 Chronic obstructive pulmonary disease with acute lower respiratory infection: Secondary | ICD-10-CM | POA: Diagnosis present

## 2022-08-09 DIAGNOSIS — J101 Influenza due to other identified influenza virus with other respiratory manifestations: Secondary | ICD-10-CM

## 2022-08-09 DIAGNOSIS — J9601 Acute respiratory failure with hypoxia: Secondary | ICD-10-CM | POA: Diagnosis present

## 2022-08-09 DIAGNOSIS — J4520 Mild intermittent asthma, uncomplicated: Secondary | ICD-10-CM

## 2022-08-09 DIAGNOSIS — F0390 Unspecified dementia without behavioral disturbance: Secondary | ICD-10-CM | POA: Insufficient documentation

## 2022-08-09 DIAGNOSIS — E876 Hypokalemia: Secondary | ICD-10-CM | POA: Diagnosis present

## 2022-08-09 DIAGNOSIS — Z1152 Encounter for screening for COVID-19: Secondary | ICD-10-CM | POA: Diagnosis not present

## 2022-08-09 DIAGNOSIS — Z23 Encounter for immunization: Secondary | ICD-10-CM | POA: Diagnosis not present

## 2022-08-09 DIAGNOSIS — R911 Solitary pulmonary nodule: Secondary | ICD-10-CM | POA: Diagnosis present

## 2022-08-09 DIAGNOSIS — I10 Essential (primary) hypertension: Secondary | ICD-10-CM

## 2022-08-09 DIAGNOSIS — Z87891 Personal history of nicotine dependence: Secondary | ICD-10-CM | POA: Diagnosis not present

## 2022-08-09 DIAGNOSIS — N400 Enlarged prostate without lower urinary tract symptoms: Secondary | ICD-10-CM | POA: Insufficient documentation

## 2022-08-09 DIAGNOSIS — Z8261 Family history of arthritis: Secondary | ICD-10-CM | POA: Diagnosis not present

## 2022-08-09 DIAGNOSIS — J45909 Unspecified asthma, uncomplicated: Secondary | ICD-10-CM | POA: Insufficient documentation

## 2022-08-09 DIAGNOSIS — E785 Hyperlipidemia, unspecified: Secondary | ICD-10-CM | POA: Insufficient documentation

## 2022-08-09 DIAGNOSIS — J159 Unspecified bacterial pneumonia: Secondary | ICD-10-CM | POA: Diagnosis present

## 2022-08-09 DIAGNOSIS — D509 Iron deficiency anemia, unspecified: Secondary | ICD-10-CM | POA: Diagnosis present

## 2022-08-09 DIAGNOSIS — R338 Other retention of urine: Secondary | ICD-10-CM | POA: Diagnosis present

## 2022-08-09 LAB — RESP PANEL BY RT-PCR (RSV, FLU A&B, COVID)  RVPGX2
Influenza A by PCR: POSITIVE — AB
Influenza B by PCR: NEGATIVE
Resp Syncytial Virus by PCR: NEGATIVE
SARS Coronavirus 2 by RT PCR: NEGATIVE

## 2022-08-09 LAB — BASIC METABOLIC PANEL
Anion gap: 7 (ref 5–15)
BUN: 18 mg/dL (ref 8–23)
CO2: 25 mmol/L (ref 22–32)
Calcium: 8.1 mg/dL — ABNORMAL LOW (ref 8.9–10.3)
Chloride: 106 mmol/L (ref 98–111)
Creatinine, Ser: 0.98 mg/dL (ref 0.61–1.24)
GFR, Estimated: >60 mL/min (ref >60)
Glucose, Bld: 124 mg/dL — ABNORMAL HIGH (ref 70–99)
Potassium: 3.8 mmol/L (ref 3.5–5.1)
Sodium: 138 mmol/L (ref 135–145)

## 2022-08-09 LAB — URINALYSIS, COMPLETE (UACMP) WITH MICROSCOPIC
Bacteria, UA: NONE SEEN
Bilirubin Urine: NEGATIVE
Glucose, UA: NEGATIVE mg/dL
Hgb urine dipstick: NEGATIVE
Ketones, ur: NEGATIVE mg/dL
Leukocytes,Ua: NEGATIVE
Nitrite: NEGATIVE
Protein, ur: 100 mg/dL — AB
Specific Gravity, Urine: 1.023 (ref 1.005–1.030)
Squamous Epithelial / HPF: NONE SEEN /HPF (ref 0–5)
pH: 5 (ref 5.0–8.0)

## 2022-08-09 LAB — CBC WITH DIFFERENTIAL/PLATELET
Abs Immature Granulocytes: 0.09 10*3/uL — ABNORMAL HIGH (ref 0.00–0.07)
Basophils Absolute: 0 10*3/uL (ref 0.0–0.1)
Basophils Relative: 0 %
Eosinophils Absolute: 0.1 10*3/uL (ref 0.0–0.5)
Eosinophils Relative: 1 %
HCT: 30.2 % — ABNORMAL LOW (ref 39.0–52.0)
Hemoglobin: 10.4 g/dL — ABNORMAL LOW (ref 13.0–17.0)
Immature Granulocytes: 2 %
Lymphocytes Relative: 7 %
Lymphs Abs: 0.4 10*3/uL — ABNORMAL LOW (ref 0.7–4.0)
MCH: 40.5 pg — ABNORMAL HIGH (ref 26.0–34.0)
MCHC: 34.4 g/dL (ref 30.0–36.0)
MCV: 117.5 fL — ABNORMAL HIGH (ref 80.0–100.0)
Monocytes Absolute: 0.6 10*3/uL (ref 0.1–1.0)
Monocytes Relative: 10 %
Neutro Abs: 4.9 10*3/uL (ref 1.7–7.7)
Neutrophils Relative %: 80 %
Platelets: 353 10*3/uL (ref 150–400)
RBC: 2.57 MIL/uL — ABNORMAL LOW (ref 4.22–5.81)
RDW: 17 % — ABNORMAL HIGH (ref 11.5–15.5)
WBC: 6 10*3/uL (ref 4.0–10.5)
nRBC: 0 % (ref 0.0–0.2)

## 2022-08-09 LAB — CBC
HCT: 31.2 % — ABNORMAL LOW (ref 39.0–52.0)
Hemoglobin: 10.5 g/dL — ABNORMAL LOW (ref 13.0–17.0)
MCH: 39.8 pg — ABNORMAL HIGH (ref 26.0–34.0)
MCHC: 33.7 g/dL (ref 30.0–36.0)
MCV: 118.2 fL — ABNORMAL HIGH (ref 80.0–100.0)
Platelets: 363 10*3/uL (ref 150–400)
RBC: 2.64 MIL/uL — ABNORMAL LOW (ref 4.22–5.81)
RDW: 16.8 % — ABNORMAL HIGH (ref 11.5–15.5)
WBC: 6.6 10*3/uL (ref 4.0–10.5)
nRBC: 0 % (ref 0.0–0.2)

## 2022-08-09 LAB — APTT: aPTT: 39 seconds — ABNORMAL HIGH (ref 24–36)

## 2022-08-09 LAB — COMPREHENSIVE METABOLIC PANEL
ALT: 61 U/L — ABNORMAL HIGH (ref 0–44)
AST: 52 U/L — ABNORMAL HIGH (ref 15–41)
Albumin: 4.2 g/dL (ref 3.5–5.0)
Alkaline Phosphatase: 70 U/L (ref 38–126)
Anion gap: 8 (ref 5–15)
BUN: 20 mg/dL (ref 8–23)
CO2: 25 mmol/L (ref 22–32)
Calcium: 8.5 mg/dL — ABNORMAL LOW (ref 8.9–10.3)
Chloride: 107 mmol/L (ref 98–111)
Creatinine, Ser: 0.98 mg/dL (ref 0.61–1.24)
GFR, Estimated: >60 mL/min (ref >60)
Glucose, Bld: 126 mg/dL — ABNORMAL HIGH (ref 70–99)
Potassium: 3.6 mmol/L (ref 3.5–5.1)
Sodium: 140 mmol/L (ref 135–145)
Total Bilirubin: 1.2 mg/dL (ref 0.3–1.2)
Total Protein: 7.2 g/dL (ref 6.5–8.1)

## 2022-08-09 LAB — PROCALCITONIN
Procalcitonin: 0.19 ng/mL
Procalcitonin: 0.21 ng/mL

## 2022-08-09 LAB — PROTIME-INR
INR: 1.3 — ABNORMAL HIGH (ref 0.8–1.2)
Prothrombin Time: 16.1 seconds — ABNORMAL HIGH (ref 11.4–15.2)

## 2022-08-09 LAB — LACTIC ACID, PLASMA: Lactic Acid, Venous: 1.8 mmol/L (ref 0.5–1.9)

## 2022-08-09 MED ORDER — TRAZODONE HCL 50 MG PO TABS
25.0000 mg | ORAL_TABLET | Freq: Every evening | ORAL | Status: DC | PRN
  Administered 2022-08-09: 25 mg via ORAL
  Filled 2022-08-09: qty 1

## 2022-08-09 MED ORDER — VITAMIN D (ERGOCALCIFEROL) 1.25 MG (50000 UNIT) PO CAPS
50000.0000 [IU] | ORAL_CAPSULE | ORAL | Status: DC
  Administered 2022-08-09: 50000 [IU] via ORAL
  Filled 2022-08-09: qty 1

## 2022-08-09 MED ORDER — OSELTAMIVIR PHOSPHATE 75 MG PO CAPS
75.0000 mg | ORAL_CAPSULE | Freq: Once | ORAL | Status: AC
  Administered 2022-08-09: 75 mg via ORAL
  Filled 2022-08-09: qty 1

## 2022-08-09 MED ORDER — LORATADINE 10 MG PO TABS: 10.0000 mg | ORAL_TABLET | Freq: Every day | ORAL | Status: DC

## 2022-08-09 MED ORDER — DOCUSATE SODIUM 100 MG PO CAPS
100.0000 mg | ORAL_CAPSULE | Freq: Every day | ORAL | Status: DC
  Administered 2022-08-09 – 2022-08-14 (×6): 100 mg via ORAL
  Filled 2022-08-09 (×6): qty 1

## 2022-08-09 MED ORDER — IOHEXOL 350 MG/ML SOLN
100.0000 mL | Freq: Once | INTRAVENOUS | Status: AC | PRN
  Administered 2022-08-09: 100 mL via INTRAVENOUS

## 2022-08-09 MED ORDER — ALBUTEROL SULFATE (2.5 MG/3ML) 0.083% IN NEBU: 2.5000 mg | INHALATION_SOLUTION | RESPIRATORY_TRACT | Status: DC | PRN

## 2022-08-09 MED ORDER — MAGNESIUM HYDROXIDE 400 MG/5ML PO SUSP: 30.0000 mL | Freq: Every day | ORAL | Status: DC | PRN

## 2022-08-09 MED ORDER — ATORVASTATIN CALCIUM 20 MG PO TABS
20.0000 mg | ORAL_TABLET | Freq: Every day | ORAL | Status: DC
  Administered 2022-08-09 – 2022-08-13 (×5): 20 mg via ORAL
  Filled 2022-08-09 (×5): qty 1

## 2022-08-09 MED ORDER — DIPHENHYDRAMINE HCL 25 MG PO TABS: 25.0000 mg | ORAL_TABLET | Freq: Four times a day (QID) | ORAL | Status: DC

## 2022-08-09 MED ORDER — DONEPEZIL HCL 5 MG PO TABS
10.0000 mg | ORAL_TABLET | Freq: Every day | ORAL | Status: DC
  Administered 2022-08-09 – 2022-08-14 (×6): 10 mg via ORAL
  Filled 2022-08-09 (×6): qty 2

## 2022-08-09 MED ORDER — PANTOPRAZOLE SODIUM 40 MG PO TBEC
40.0000 mg | DELAYED_RELEASE_TABLET | Freq: Every day | ORAL | Status: DC
  Administered 2022-08-09 – 2022-08-14 (×6): 40 mg via ORAL
  Filled 2022-08-09 (×6): qty 1

## 2022-08-09 MED ORDER — ASPIRIN 81 MG PO CHEW
81.0000 mg | CHEWABLE_TABLET | Freq: Every day | ORAL | Status: DC
  Administered 2022-08-09 – 2022-08-14 (×6): 81 mg via ORAL
  Filled 2022-08-09 (×6): qty 1

## 2022-08-09 MED ORDER — ENOXAPARIN SODIUM 40 MG/0.4ML IJ SOSY
40.0000 mg | PREFILLED_SYRINGE | INTRAMUSCULAR | Status: DC
  Administered 2022-08-09 – 2022-08-14 (×6): 40 mg via SUBCUTANEOUS
  Filled 2022-08-09 (×6): qty 0.4

## 2022-08-09 MED ORDER — POLYVINYL ALCOHOL 1.4 % OP SOLN: 1.0000 [drp] | OPHTHALMIC | Status: DC | PRN

## 2022-08-09 MED ORDER — ONDANSETRON HCL 4 MG PO TABS
4.0000 mg | ORAL_TABLET | Freq: Four times a day (QID) | ORAL | Status: DC | PRN
  Filled 2022-08-09: qty 1

## 2022-08-09 MED ORDER — OSELTAMIVIR PHOSPHATE 30 MG PO CAPS
30.0000 mg | ORAL_CAPSULE | Freq: Two times a day (BID) | ORAL | Status: AC
  Administered 2022-08-09 – 2022-08-13 (×9): 30 mg via ORAL
  Filled 2022-08-09 (×9): qty 1

## 2022-08-09 MED ORDER — GABAPENTIN 300 MG PO CAPS
300.0000 mg | ORAL_CAPSULE | Freq: Three times a day (TID) | ORAL | Status: DC
  Administered 2022-08-09 – 2022-08-14 (×16): 300 mg via ORAL
  Filled 2022-08-09 (×16): qty 1

## 2022-08-09 MED ORDER — BUDESONIDE 0.5 MG/2ML IN SUSP
0.5000 mg | Freq: Two times a day (BID) | RESPIRATORY_TRACT | Status: DC
  Administered 2022-08-09 – 2022-08-14 (×10): 0.5 mg via RESPIRATORY_TRACT
  Filled 2022-08-09 (×10): qty 2

## 2022-08-09 MED ORDER — ALBUTEROL SULFATE HFA 108 (90 BASE) MCG/ACT IN AERS: 2.0000 | INHALATION_SPRAY | RESPIRATORY_TRACT | Status: DC | PRN

## 2022-08-09 MED ORDER — BUDESONIDE 0.25 MG/2ML IN SUSP
0.2500 mg | Freq: Two times a day (BID) | RESPIRATORY_TRACT | Status: DC
  Administered 2022-08-09: 0.25 mg via RESPIRATORY_TRACT
  Filled 2022-08-09: qty 2

## 2022-08-09 MED ORDER — FERROUS SULFATE 325 (65 FE) MG PO TABS
325.0000 mg | ORAL_TABLET | Freq: Every evening | ORAL | Status: DC
  Administered 2022-08-09 – 2022-08-13 (×5): 325 mg via ORAL
  Filled 2022-08-09 (×5): qty 1

## 2022-08-09 MED ORDER — ACETAMINOPHEN 650 MG RE SUPP: 650.0000 mg | Freq: Four times a day (QID) | RECTAL | Status: DC | PRN

## 2022-08-09 MED ORDER — ACETAMINOPHEN 325 MG PO TABS
650.0000 mg | ORAL_TABLET | Freq: Four times a day (QID) | ORAL | Status: DC | PRN
  Administered 2022-08-09 – 2022-08-11 (×5): 650 mg via ORAL
  Filled 2022-08-09 (×6): qty 2

## 2022-08-09 MED ORDER — HYDROXYUREA 500 MG PO CAPS
500.0000 mg | ORAL_CAPSULE | Freq: Every day | ORAL | Status: DC
  Administered 2022-08-09 – 2022-08-13 (×5): 500 mg via ORAL
  Filled 2022-08-09 (×5): qty 1

## 2022-08-09 MED ORDER — MOMETASONE FUROATE 220 MCG/INH IN AEPB: 2.0000 | INHALATION_SPRAY | Freq: Every day | RESPIRATORY_TRACT | Status: DC

## 2022-08-09 MED ORDER — FLUTICASONE PROPIONATE 50 MCG/ACT NA SUSP
2.0000 | Freq: Every day | NASAL | Status: DC
  Administered 2022-08-10 – 2022-08-14 (×5): 2 via NASAL
  Filled 2022-08-09: qty 16

## 2022-08-09 MED ORDER — TAMSULOSIN HCL 0.4 MG PO CAPS
0.4000 mg | ORAL_CAPSULE | Freq: Every day | ORAL | Status: DC
  Administered 2022-08-09 – 2022-08-14 (×6): 0.4 mg via ORAL
  Filled 2022-08-09 (×7): qty 1

## 2022-08-09 MED ORDER — PNEUMOCOCCAL 20-VAL CONJ VACC 0.5 ML IM SUSY
0.5000 mL | PREFILLED_SYRINGE | INTRAMUSCULAR | Status: AC
  Administered 2022-08-13: 0.5 mL via INTRAMUSCULAR
  Filled 2022-08-09: qty 0.5

## 2022-08-09 MED ORDER — TIOTROPIUM BROMIDE MONOHYDRATE 18 MCG IN CAPS
18.0000 ug | ORAL_CAPSULE | Freq: Every day | RESPIRATORY_TRACT | Status: DC
  Administered 2022-08-10 – 2022-08-14 (×5): 18 ug via RESPIRATORY_TRACT
  Filled 2022-08-09: qty 5

## 2022-08-09 MED ORDER — LORATADINE 10 MG PO TABS
10.0000 mg | ORAL_TABLET | Freq: Every day | ORAL | Status: DC
  Administered 2022-08-09 – 2022-08-14 (×6): 10 mg via ORAL
  Filled 2022-08-09 (×6): qty 1

## 2022-08-09 MED ORDER — FINASTERIDE 5 MG PO TABS
5.0000 mg | ORAL_TABLET | Freq: Every day | ORAL | Status: DC
  Administered 2022-08-09 – 2022-08-14 (×6): 5 mg via ORAL
  Filled 2022-08-09 (×6): qty 1

## 2022-08-09 MED ORDER — POTASSIUM CHLORIDE IN NACL 20-0.9 MEQ/L-% IV SOLN
INTRAVENOUS | Status: DC
  Filled 2022-08-09 (×3): qty 1000

## 2022-08-09 MED ORDER — IPRATROPIUM BROMIDE 0.06 % NA SOLN: 2.0000 | Freq: Two times a day (BID) | NASAL | Status: DC | PRN

## 2022-08-09 MED ORDER — ONDANSETRON HCL 4 MG/2ML IJ SOLN: 4.0000 mg | Freq: Four times a day (QID) | INTRAMUSCULAR | Status: DC | PRN

## 2022-08-09 MED ORDER — TERAZOSIN HCL 5 MG PO CAPS: 10.0000 mg | ORAL_CAPSULE | Freq: Every day | ORAL | Status: DC

## 2022-08-09 MED ORDER — LOSARTAN POTASSIUM 25 MG PO TABS
25.0000 mg | ORAL_TABLET | Freq: Every day | ORAL | Status: DC
  Administered 2022-08-09 – 2022-08-14 (×6): 25 mg via ORAL
  Filled 2022-08-09 (×6): qty 1

## 2022-08-09 NOTE — Assessment & Plan Note (Signed)
-   We will continue PPI therapy 

## 2022-08-09 NOTE — ED Notes (Signed)
Assumed care from [place], [place]. Pt resting comfortably in bed at this time. Pt denies any current needs or questions. Call light with in reach. Family at bedside.

## 2022-08-09 NOTE — ED Notes (Signed)
PT at bedside.

## 2022-08-09 NOTE — Assessment & Plan Note (Signed)
-   We will continue Aricept. 

## 2022-08-09 NOTE — Evaluation (Signed)
Physical Therapy Evaluation Patient Details Name: Jack Rios MRN: [id number] DOB: May 31, 1936 Today's Date: 08/09/2022  History of Present Illness  Jack Rios is an 41yoM who comes to [facility] on 12/31 after several days ilnness, proegressive illness. At baseline pt AMB limited community distances with SPC. (+) Flu.  Clinical Impression  Pt admitted with above Dx. Pt has functional limitations due to deficits below (see "PT Problem List"). Son Chanetta Marshall provide details on baseline functional status. Today pt requires considerable physical assistance to perform bed mobility, too weak and lethargic to attempt transfers and short distance walking. Pt has acute O2 needs. Patient's performance this date reveals decreased ability, independence, and tolerance in performing all basic mobility required for performance of activities of daily living. Pt requires additional DME, close physical assistance, and cues for safe participate in mobility. Pt will benefit from skilled PT intervention to increase independence and safety with basic mobility in preparation for discharge to the venue listed below.       Recommendations for follow up therapy are one component of a multi-disciplinary discharge planning process, led by the attending physician.  Recommendations may be updated based on patient status, additional functional criteria and insurance authorization.  Follow Up Recommendations Skilled nursing-short term rehab (<3 hours/day) Can patient physically be transported by private vehicle: No    Assistance Recommended at Discharge Frequent or constant Supervision/Assistance  Patient can return home with the following  Two people to help with bathing/dressing/bathroom;A lot of help with walking and/or transfers;Assist for transportation;Help with stairs or ramp for entrance    Equipment Recommendations None recommended by PT  Recommendations for Other Services       Functional Status Assessment Patient  has had a recent decline in their functional status and demonstrates the ability to make significant improvements in function in a reasonable and predictable amount of time.     Precautions / Restrictions Precautions Precautions: Fall      Mobility  Bed Mobility Overal bed mobility: Needs Assistance Bed Mobility: Supine to Sit, Sit to Supine     Supine to sit: Max assist Sit to supine: Max assist        Transfers                        Ambulation/Gait                  Stairs            Wheelchair Mobility    Modified Rankin (Stroke Patients Only)       Balance                                             Pertinent Vitals/Pain Pain Assessment Pain Assessment: No/denies pain    Home Living Family/patient expects to be discharged to:: Private residence Living Arrangements: Spouse/significant other Available Help at Discharge: Family Type of Home: Mobile home Home Access: Ramped entrance       Home Layout: One level Home Equipment: Cane - single Librarian, academic (2 wheels);Wheelchair - manual Additional Comments: sleeps in a regular flat bed    Prior Function Prior Level of Function : Independent/Modified Independent             Mobility Comments: mostly household AMB, 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 ADLs Comments: typically supervision to modI     Hand Dominance        Extremity/Trunk Assessment                Communication      Cognition Arousal/Alertness: Lethargic   Overall Cognitive Status: Impaired/Different from baseline                                          General Comments      Exercises     Assessment/Plan    PT Assessment Patient needs continued PT services  PT Problem List Decreased strength;Decreased range of motion;Decreased activity tolerance;Decreased  balance;Decreased mobility       PT Treatment Interventions DME instruction;Balance training;Gait training;Stair training;Functional mobility training;Therapeutic activities;Therapeutic exercise;Patient/family education    PT Goals (Current goals can be found in the Care Plan section)  Acute Rehab PT Goals Patient Stated Goal: return to baseline PT Goal Formulation: With family Time For Goal Achievement: 08/09/22 Potential to Achieve Goals: Fair    Frequency Min 2X/week     Co-evaluation               AM-PAC PT "6 Clicks" Mobility  Outcome Measure Help needed turning from your back to your side while in a flat bed without using bedrails?: Total Help needed moving from lying on your back to sitting on the side of a flat bed without using bedrails?: Total Help needed moving to and from a bed to a chair (including a wheelchair)?: Total Help needed standing up from a chair using your arms (e.g., wheelchair or bedside chair)?: Total Help needed to walk in hospital room?: A Lot Help needed climbing 3-5 steps with a railing? : A Lot 6 Click Score: 8    End of Session Equipment Utilized During Treatment: Oxygen Activity Tolerance: Patient limited by fatigue;Patient limited by lethargy Patient left: in bed;with family/visitor present;with call bell/phone within reach   PT Visit Diagnosis: Difficulty in walking, not elsewhere classified (R26.2);Other abnormalities of gait and mobility (R26.89);Muscle weakness (generalized) (M62.81)    Time: 1120-1140 PT Time Calculation (min) (ACUTE ONLY): 20 min   Charges:   PT Evaluation $PT Eval Moderate Complexity: 1 Mod         12:51 PM, 08/09/22 Jack Rios, PT, DPT Physical Therapist - [facility]  [phone] (ASCOM)    Nicolaas Savo C 08/09/2022, 12:49 PM

## 2022-08-09 NOTE — Assessment & Plan Note (Signed)
-   He will be placed on p.o. Tamiflu.

## 2022-08-09 NOTE — Progress Notes (Signed)
PROGRESS NOTE    Jack Rios  ZOX:[id number] DOB: Sep 23, 1935 DOA: 08/08/2022 PCP: [facility]    Brief Narrative:   Jack ProudJames R Rios is a 86 y.o. male with past medical history significant for asthma/COPD, dementia, BPH, essential hypertension, dyslipidemia, GERD who presented to [facility] ED on 12/30 with generalized weakness, myalgias, nonproductive cough, and shortness of breath.  Family reports initial onset Christmas Day with continued overall decline.  Family reports his wife has similar illness.  Patient denies chest pain, no palpitations, no vomiting/diarrhea, no abdominal pain, no urinary symptoms.  On EMS arrival patient's SpO2 was noted to be 89% on room air, not oxygen dependent at baseline.  Patient was transported to the ED for further evaluation  In the ED, temperature 98.0 F, HR 102, RR 24, BP 135/92, SpO2 95% on 4 L nasal cannula.  WBC 6.0, hemoglobin 10.4, platelets 353.  Sodium 140, potassium 3.6, chloride 107, CO2 25, glucose 126, BUN 20, creatinine 0.98.  AST 52, ALT 61, total bilirubin 1.2.  Lactic acid 1.8.  Procalcitonin 0.19.  Influenza A PCR positive.  RSV PCR negative.  COVID-19 PCR negative.  Urinalysis unrevealing.  CT head without contrast with no acute intracranial abnormality, noted cerebral atrophy and microvascular disease.  Chest x-ray with no acute cardiopulmonary disease process.  CT angiogram chest negative for pulmonary embolism, increasing nodularity left upper lobe suspicious for neoplastic involvement.  CT abdomen/pelvis with diverticulosis without diverticulitis, no acute abnormality.  Urine culture and blood cultures obtained.  EDP consulted [facility] for admission for further evaluation management of acute hypoxic respiratory failure secondary to influenza A viral infection.  Assessment & Plan:   Acute hypoxic respiratory failure, POA Influenza A viral infection Hx asthma/COPD Patient presenting to ED with progressive weakness/malaise, myalgias,  shortness of breath and nonproductive cough.  Patient with sick contacts, wife with similar symptoms.  Patient was noted be hypoxic with SpO2 89% on room air on EMS arrival.  Patient with Tmax 102.1.  Influenza a PCR positive.  COVID/RSV PCR negative.  No leukocytosis.  CT angiogram chest negative for PE or concern for infiltrate. -- Tamiflu 30 mg p.o. twice daily -- Budesonide neb twice daily --Spiriva -- Albuterol neb every 4 hours as needed shortness of breath/wheezing  Left upper lobe nodularity Patient with history of tobacco abuse, CT angiogram with finding of increased nodularity left upper lobe suspicious for neoplastic involvement.  Outpatient follow-up with PCP for further evaluation.  Essential hypertension -- Losartan 25 mg p.o. daily -- Continue aspirin and statin  Dyslipidemia --Atorvastatin 20 mg p.o. daily  BPH:  --Finasteride 5 mg p.o. daily --Tamsulosin 0.4 mg p.o. daily  GERD --Protonix 40 mg p.o. daily  Iron deficiency anemia: Ferrous sulfate 3 and 25 mg p.o. daily  Dementia --Delirium precautions --Get up during the day --Encourage a familiar face to remain present throughout the day --Keep blinds open and lights on during daylight hours --Minimize the use of opioids/benzodiazepines  Weakness/debility/deconditioning: Seen by PT and OT with recommendations of SNF placement. --TOC consulted   DVT prophylaxis: enoxaparin (LOVENOX) injection 40 mg Start: 08/09/22 1000    Code Status: DNR Family Communication: Updated daughter present at bedside this morning  Disposition Plan:  Level of care: Telemetry Medical Status is: Inpatient Remains inpatient appropriate because: Will need SNF placement    Consultants:  None  Procedures:  None  Antimicrobials:  Tamiflu 12/30>>   Subjective: Patient seen examined bedside, resting calmly.  Lying in bed.  Remains in ED holding  area.  Daughter present at bedside.  Continues with shortness of breath,  slightly increased work of breathing.  Remains on oxygen, 4 L nasal cannula at rest.  Feels slightly improved but remains pleasantly confused which is his normal baseline per family.  No other specific complaints or concerns at this time.  Denies headache, no chest pain, no abdominal pain, no nausea/vomiting.  No acute events overnight per nursing staff.  Objective: Vitals:   08/09/22 0900 08/09/22 1000 08/09/22 1100 08/09/22 1400  BP: (!) 155/84  132/70 98/61  Pulse: (!) 106 100 93 88  Resp: (!) 23 (!) 22 (!) 24 (!) 21  Temp:  98 F (36.7 C) 98.1 F (36.7 C)   TempSrc:   Axillary   SpO2: 98% 98% 98% 97%  Weight:      Height:       No intake or output data in the 24 hours ending 08/09/22 1404 Filed Weights   08/08/22 2321  Weight: 86.2 kg    Examination:  Physical Exam: GEN: alert, alert to self/time, pleasantly confused, mild respiratory distress, chronically ill/elderly in appearance HEENT: NCAT, PERRL, EOMI, sclera clear, MMM PULM: Breath sounds diminished bilateral bases with diffuse wheezing throughout all lung fields, no crackles, slightly increased respiratory effort without accessory muscle use, on 4 L nasal cannula with SpO2 96% at rest CV: RRR w/o M/G/R GI: abd soft, NTND, NABS, no R/G/M MSK: no peripheral edema, moves all extremities independently NEURO: CN II-XII intact, no focal deficits, sensation to light touch intact PSYCH: Depressed mood, flat affect Integumentary: dry/intact, no rashes or wounds    Data Reviewed: I have personally reviewed following labs and imaging studies  CBC: Recent Labs  Lab 08/08/22 2333 08/09/22 0246  WBC 6.0 6.6  NEUTROABS 4.9  --   HGB 10.4* 10.5*  HCT 30.2* 31.2*  MCV 117.5* 118.2*  PLT 353 363   Basic Metabolic Panel: Recent Labs  Lab 08/08/22 2333 08/09/22 0246  NA 140 138  K 3.6 3.8  CL 107 106  CO2 25 25  GLUCOSE 126* 124*  BUN 20 18  CREATININE 0.98 0.98  CALCIUM 8.5* 8.1*   GFR: Estimated Creatinine  Clearance: 57.9 mL/min (by C-G formula based on SCr of 0.98 mg/dL). Liver Function Tests: Recent Labs  Lab 08/08/22 2333  AST 52*  ALT 61*  ALKPHOS 70  BILITOT 1.2  PROT 7.2  ALBUMIN 4.2   No results for input(s): "LIPASE", "AMYLASE" in the last 168 hours. No results for input(s): "AMMONIA" in the last 168 hours. Coagulation Profile: Recent Labs  Lab 08/08/22 2333  INR 1.3*   Cardiac Enzymes: No results for input(s): "CKTOTAL", "CKMB", "CKMBINDEX", "TROPONINI" in the last 168 hours. BNP (last 3 results) No results for input(s): "PROBNP" in the last 8760 hours. HbA1C: No results for input(s): "HGBA1C" in the last 72 hours. CBG: No results for input(s): "GLUCAP" in the last 168 hours. Lipid Profile: No results for input(s): "CHOL", "HDL", "LDLCALC", "TRIG", "CHOLHDL", "LDLDIRECT" in the last 72 hours. Thyroid Function Tests: No results for input(s): "TSH", "T4TOTAL", "FREET4", "T3FREE", "THYROIDAB" in the last 72 hours. Anemia Panel: No results for input(s): "VITAMINB12", "FOLATE", "FERRITIN", "TIBC", "IRON", "RETICCTPCT" in the last 72 hours. Sepsis Labs: Recent Labs  Lab 08/08/22 2333 08/09/22 0246  PROCALCITON 0.19 0.21  LATICACIDVEN 1.8  --     Recent Results (from the past 240 hour(s))  Resp panel by RT-PCR (RSV, Flu A&B, Covid) Anterior Nasal Swab     Status: Abnormal  Collection Time: 08/08/22 11:33 PM   Specimen: Anterior Nasal Swab  Result Value Ref Range Status   SARS Coronavirus 2 by RT PCR NEGATIVE NEGATIVE Final    Comment: (NOTE) SARS-CoV-2 target nucleic acids are NOT DETECTED.  The SARS-CoV-2 RNA is generally detectable in upper respiratory specimens during the acute phase of infection. The lowest concentration of SARS-CoV-2 viral copies this assay can detect is 138 copies/mL. A negative result does not preclude SARS-Cov-2 infection and should not be used as the sole basis for treatment or other patient management decisions. A negative result  may occur with  improper specimen collection/handling, submission of specimen other than nasopharyngeal swab, presence of viral mutation(s) within the areas targeted by this assay, and inadequate number of viral copies(<138 copies/mL). A negative result must be combined with clinical observations, patient history, and epidemiological information. The expected result is Negative.  Fact Sheet for Patients:  BloggerCourse.com  Fact Sheet for Healthcare Providers:  SeriousBroker.it  This test is no t yet approved or cleared by the [place] FDA and  has been authorized for detection and/or diagnosis of SARS-CoV-2 by FDA under an Emergency Use Authorization (EUA). This EUA will remain  in effect (meaning this test can be used) for the duration of the COVID-19 declaration under Section 564(b)(1) of the Act, 21 U.S.C.section 360bbb-3(b)(1), unless the authorization is terminated  or revoked sooner.       Influenza A by PCR POSITIVE (A) NEGATIVE Final   Influenza B by PCR NEGATIVE NEGATIVE Final    Comment: (NOTE) The Xpert Xpress SARS-CoV-2/FLU/RSV plus assay is intended as an aid in the diagnosis of influenza from Nasopharyngeal swab specimens and should not be used as a sole basis for treatment. Nasal washings and aspirates are unacceptable for Xpert Xpress SARS-CoV-2/FLU/RSV testing.  Fact Sheet for Patients: BloggerCourse.com  Fact Sheet for Healthcare Providers: SeriousBroker.it  This test is not yet approved or cleared by the [place] FDA and has been authorized for detection and/or diagnosis of SARS-CoV-2 by FDA under an Emergency Use Authorization (EUA). This EUA will remain in effect (meaning this test can be used) for the duration of the COVID-19 declaration under Section 564(b)(1) of the Act, 21 U.S.C. section 360bbb-3(b)(1), unless the authorization is terminated  or revoked.     Resp Syncytial Virus by PCR NEGATIVE NEGATIVE Final    Comment: (NOTE) Fact Sheet for Patients: BloggerCourse.com  Fact Sheet for Healthcare Providers: SeriousBroker.it  This test is not yet approved or cleared by the [place] FDA and has been authorized for detection and/or diagnosis of SARS-CoV-2 by FDA under an Emergency Use Authorization (EUA). This EUA will remain in effect (meaning this test can be used) for the duration of the COVID-19 declaration under Section 564(b)(1) of the Act, 21 U.S.C. section 360bbb-3(b)(1), unless the authorization is terminated or revoked.  Performed at [facility], [address] [place], [place] [address]   Blood Culture (routine x 2)     Status: None (Preliminary result)   Collection Time: 08/08/22 11:33 PM   Specimen: BLOOD  Result Value Ref Range Status   Specimen Description BLOOD BLOOD LEFT FOREARM  Final   Special Requests   Final    BOTTLES DRAWN AEROBIC AND ANAEROBIC Blood Culture results may not be optimal due to an inadequate volume of blood received in culture bottles   Culture   Final    NO GROWTH < 12 HOURS Performed at [facility], [address], [place], [place] [address]  Report Status PENDING  Incomplete  Blood Culture (routine x 2)     Status: None (Preliminary result)   Collection Time: 08/08/22 11:33 PM   Specimen: BLOOD  Result Value Ref Range Status   Specimen Description BLOOD BLOOD RIGHT FOREARM  Final   Special Requests   Final    BOTTLES DRAWN AEROBIC AND ANAEROBIC Blood Culture adequate volume   Culture   Final    NO GROWTH < 12 HOURS Performed at [facility], [address], [place], [place] [address]    Report Status PENDING  Incomplete         Radiology Studies: CT Angio Chest PE W and/or Wo Contrast  Result Date: 08/09/2022 CLINICAL DATA:  New onset hypoxia and tachycardia, generalized  weakness EXAM: CT ANGIOGRAPHY CHEST CT ABDOMEN AND PELVIS WITH CONTRAST TECHNIQUE: Multidetector CT imaging of the chest was performed using the standard protocol during bolus administration of intravenous contrast. Multiplanar CT image reconstructions and MIPs were obtained to evaluate the vascular anatomy. Multidetector CT imaging of the abdomen and pelvis was performed using the standard protocol during bolus administration of intravenous contrast. RADIATION DOSE REDUCTION: This exam was performed according to the departmental dose-optimization program which includes automated exposure control, adjustment of the mA and/or kV according to patient size and/or use of iterative reconstruction technique. CONTRAST:  OMNIPAQUE IOHEXOL 350 MG/ML SOLN COMPARISON:  Chest x-ray from the previous day, PET-CT from 09/25/2019. FINDINGS: CTA CHEST FINDINGS Cardiovascular: Thoracic aorta demonstrates atherosclerotic calcifications. No aneurysmal dilatation or dissection is seen. No cardiac enlargement is noted. No pericardial effusion is seen. Mild coronary calcifications are noted. The pulmonary artery shows a normal branching pattern without intraluminal filling defect to suggest pulmonary embolism. Mediastinum/Nodes: Thoracic inlet is within normal limits. Esophagus is partially fluid-filled likely related to reflux. No hilar or mediastinal adenopathy is noted. Lungs/Pleura: Lungs are well aerated bilaterally. Mild emphysematous changes are seen. Basilar atelectasis is noted. In the left upper lobe there are changes consistent with prior resection with a surgical suture line superiorly. Additionally there is increasing soft tissue in the apex adjacent to the suture line which could be progressive neoplastic involvement. Musculoskeletal: No acute rib abnormality is noted. Degenerative changes of the thoracic spine are noted. Midthoracic hemangioma is seen. Review of the MIP images confirms the above findings. CT ABDOMEN  and PELVIS FINDINGS Hepatobiliary: No focal liver abnormality is seen. Status post cholecystectomy. No biliary dilatation. Pancreas: Unremarkable. No pancreatic ductal dilatation or surrounding inflammatory changes. Spleen: Normal in size without focal abnormality. Adrenals/Urinary Tract: Adrenal glands are within normal limits. Multiple simple appearing cysts are noted bilaterally stable from the prior study. No further follow-up is recommended. Atherosclerotic calcifications of the renal arteries are seen. No renal calculi or obstructive changes are noted. The bladder is partially distended. Stomach/Bowel: Diverticular change of the colon is noted without evidence of diverticulitis. No obstructive changes are seen. The appendix is partially visualized. Small bowel and stomach are within normal limits. Vascular/Lymphatic: Aortic atherosclerosis. No enlarged abdominal or pelvic lymph nodes. Reproductive: Prostate is unremarkable. Other: No abdominal wall hernia or abnormality. No abdominopelvic ascites. Musculoskeletal: No acute or significant osseous findings. Review of the MIP images confirms the above findings. IMPRESSION: CTA of the chest: No evidence of pulmonary emboli. Increasing nodularity in the left upper lobe adjacent to a suture line highly suspicious for neoplastic involvement. Further workup is recommended. CT of the abdomen and pelvis: Diverticulosis without diverticulitis. Chronic changes without acute abnormality. Electronically Signed   By:  Mark  Lukens M.D.   Alcide Clever/2023 01:16   CT ABDOMEN PELVIS W CONTRAST  Result Date: 08/09/2022 CLINICAL DATA:  New onset hypoxia and tachycardia, generalized weakness EXAM: CT ANGIOGRAPHY CHEST CT ABDOMEN AND PELVIS WITH CONTRAST TECHNIQUE: Multidetector CT imaging of the chest was performed using the standard protocol during bolus administration of intravenous contrast. Multiplanar CT image reconstructions and MIPs were obtained to evaluate the vascular  anatomy. Multidetector CT imaging of the abdomen and pelvis was performed using the standard protocol during bolus administration of intravenous contrast. RADIATION DOSE REDUCTION: This exam was performed according to the departmental dose-optimization program which includes automated exposure control, adjustment of the mA and/or kV according to patient size and/or use of iterative reconstruction technique. CONTRAST:  OMNIPAQUE IOHEXOL 350 MG/ML SOLN COMPARISON:  Chest x-ray from the previous day, PET-CT from 09/25/2019. FINDINGS: CTA CHEST FINDINGS Cardiovascular: Thoracic aorta demonstrates atherosclerotic calcifications. No aneurysmal dilatation or dissection is seen. No cardiac enlargement is noted. No pericardial effusion is seen. Mild coronary calcifications are noted. The pulmonary artery shows a normal branching pattern without intraluminal filling defect to suggest pulmonary embolism. Mediastinum/Nodes: Thoracic inlet is within normal limits. Esophagus is partially fluid-filled likely related to reflux. No hilar or mediastinal adenopathy is noted. Lungs/Pleura: Lungs are well aerated bilaterally. Mild emphysematous changes are seen. Basilar atelectasis is noted. In the left upper lobe there are changes consistent with prior resection with a surgical suture line superiorly. Additionally there is increasing soft tissue in the apex adjacent to the suture line which could be progressive neoplastic involvement. Musculoskeletal: No acute rib abnormality is noted. Degenerative changes of the thoracic spine are noted. Midthoracic hemangioma is seen. Review of the MIP images confirms the above findings. CT ABDOMEN and PELVIS FINDINGS Hepatobiliary: No focal liver abnormality is seen. Status post cholecystectomy. No biliary dilatation. Pancreas: Unremarkable. No pancreatic ductal dilatation or surrounding inflammatory changes. Spleen: Normal in size without focal abnormality. Adrenals/Urinary Tract: Adrenal  glands are within normal limits. Multiple simple appearing cysts are noted bilaterally stable from the prior study. No further follow-up is recommended. Atherosclerotic calcifications of the renal arteries are seen. No renal calculi or obstructive changes are noted. The bladder is partially distended. Stomach/Bowel: Diverticular change of the colon is noted without evidence of diverticulitis. No obstructive changes are seen. The appendix is partially visualized. Small bowel and stomach are within normal limits. Vascular/Lymphatic: Aortic atherosclerosis. No enlarged abdominal or pelvic lymph nodes. Reproductive: Prostate is unremarkable. Other: No abdominal wall hernia or abnormality. No abdominopelvic ascites. Musculoskeletal: No acute or significant osseous findings. Review of the MIP images confirms the above findings. IMPRESSION: CTA of the chest: No evidence of pulmonary emboli. Increasing nodularity in the left upper lobe adjacent to a suture line highly suspicious for neoplastic involvement. Further workup is recommended. CT of the abdomen and pelvis: Diverticulosis without diverticulitis. Chronic changes without acute abnormality. Electronically Signed   By: Alcide Clever M.D.   On: 08/09/2022 01:16   CT HEAD WO CONTRAST ( )  Result Date: 08/09/2022 CLINICAL DATA:  Generalized weakness. EXAM: CT HEAD WITHOUT CONTRAST TECHNIQUE: Contiguous axial images were obtained from the base of the skull through the vertex without intravenous contrast. RADIATION DOSE REDUCTION: This exam was performed according to the departmental dose-optimization program which includes automated exposure control, adjustment of the mA and/or kV according to patient size and/or use of iterative reconstruction technique. COMPARISON:  None Available. FINDINGS: Brain: There is mild to moderate severity cerebral atrophy with widening of the extra-axial  spaces and ventricular dilatation. There are areas of decreased attenuation within  the white matter tracts of the supratentorial brain, consistent with microvascular disease changes. Vascular: No hyperdense vessel or unexpected calcification. Skull: Normal. Negative for fracture or focal lesion. 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 Other: None. IMPRESSION: 1. No acute intracranial abnormality. 2. Cerebral atrophy and microvascular disease changes of the supratentorial brain. Electronically Signed   By: Aram Candela M.D.   On: 08/09/2022 01:12   DG Chest Port 1 View  Result Date: 08/08/2022 CLINICAL DATA:  Cough and weakness EXAM: PORTABLE CHEST 1 VIEW COMPARISON:  04/30/17 FINDINGS: Cardiac shadow is within normal limits. Lungs are well aerated bilaterally. Mild interstitial changes are noted without focal infiltrate. No bony abnormality is seen. IMPRESSION: No acute abnormality noted. Electronically Signed   By: Alcide Clever M.D.   On: 08/08/2022 23:47        Scheduled Meds:  aspirin  81 mg Oral Daily   atorvastatin  20 mg Oral QHS   budesonide (PULMICORT) nebulizer solution  0.5 mg Nebulization BID   docusate sodium  100 mg Oral Daily   donepezil  10 mg Oral Daily   enoxaparin (LOVENOX) injection  40 mg Subcutaneous Q24H   ferrous sulfate  325 mg Oral QPM   finasteride  5 mg Oral Daily   fluticasone  2 spray Each Nare Daily   gabapentin  300 mg Oral TID   hydroxyurea  500 mg Oral QHS   loratadine  10 mg Oral Daily   losartan  25 mg Oral Daily   oseltamivir  30 mg Oral BID   pantoprazole  40 mg Oral Daily   tamsulosin  0.4 mg Oral Daily   tiotropium  18 mcg Inhalation Daily   Vitamin D (Ergocalciferol)  50,000 Units Oral Q7 days   Continuous Infusions:  0.9 % NaCl with KCl 20 mEq / L 75 mL/hr at 08/09/22 1346     LOS: 0 days    Time spent: 52 minutes spent on chart review, discussion with nursing staff, consultants, updating family and interview/physical exam; more than 50% of that time was spent in  counseling and/or coordination of care.    Alvira Philips [place], DO [facility] Available via Epic secure chat 7am-7pm After these hours, please refer to coverage provider listed on [email or web address] 08/09/2022, 2:04 PM

## 2022-08-09 NOTE — Assessment & Plan Note (Signed)
-   We will continue statin therapy. 

## 2022-08-09 NOTE — Assessment & Plan Note (Addendum)
-   This is secondary to influenza A without clear evidence for pneumonia. - The patient will be admitted to a medical telemetry bed. - O2 protocol will be followed. - We will place him on p.o. Tamiflu.

## 2022-08-09 NOTE — Evaluation (Signed)
Occupational Therapy Evaluation Patient Details Name: Jack Rios MRN: [id number] DOB: 24-Mar-1936 Today's Date: 08/09/2022   History of Present Illness Pt is an 86 year old patient admitted with influenza A after presenting with feeling sick with generalized malaise and myalgias; PMH significant for asthma, dementia, BPH, GERD, essential hypertension and dyslipidemia   Clinical Impression   Chart reviewed, nurse cleared pt for participation in OT evaluation. Pt is oriented to self, place, grossly to situation; not oriented to date; Pt requires increased time for processing with noted STM deficits. Pt is a ?historian however reports he is MOD I for ADL. Pt presents with deficits in strength, endurance, activity tolerance, balance, cognition affecting safe and optimal ADL completion. Bed mobility completed with MOD A, STS with MOD A with HHA, three lateral steps up the bed to the right with MOD A. MAX A required for LB dressing, MIN A for grooming tasks anticipated. At this time recommend discharge to STR to address functional deficits and to facilitate optimal return to PLOF. OT will continue to follow acutely.      Recommendations for follow up therapy are one component of a multi-disciplinary discharge planning process, led by the attending physician.  Recommendations may be updated based on patient status, additional functional criteria and insurance authorization.   Follow Up Recommendations  Skilled nursing-short term rehab (<3 hours/day)     Assistance Recommended at Discharge Frequent or constant Supervision/Assistance  Patient can return home with the following A lot of help with walking and/or transfers;A lot of help with bathing/dressing/bathroom    Functional Status Assessment  Patient has had a recent decline in their functional status and demonstrates the ability to make significant improvements in function in a reasonable and predictable amount of time.  Equipment  Recommendations  Other (comment) (per next venue of care)    Recommendations for Other Services       Precautions / Restrictions Precautions Precautions: Fall      Mobility Bed Mobility Overal bed mobility: Needs Assistance Bed Mobility: Supine to Sit, Sit to Supine     Supine to sit: Mod assist, HOB elevated Sit to supine: Mod assist, HOB elevated   General bed mobility comments: incrased time, frequent vcs    Transfers Overall transfer level: Needs assistance Equipment used: None Transfers: Sit to/from Stand Sit to Stand: Mod assist           General transfer comment: two lateral steps up the bed with MOD A with HHA,      Balance Overall balance assessment: Needs assistance Sitting-balance support: Feet supported Sitting balance-Leahy Scale: Good     Standing balance support: Single extremity supported Standing balance-Leahy Scale: Poor                             ADL either performed or assessed with clinical judgement   ADL Overall ADL's : Needs assistance/impaired Eating/Feeding: Supervision/ safety Eating/Feeding Details (indicate cue type and reason): seated in bed Grooming: Wash/dry face;Sitting;Minimal assistance               Lower Body Dressing: Maximal assistance Lower Body Dressing Details (indicate cue type and reason): socks     Toileting- Clothing Manipulation and Hygiene: Maximal assistance               Vision Patient Visual Report: No change from baseline       Perception     Praxis      Pertinent  Vitals/Pain Pain Assessment Pain Assessment: No/denies pain     Hand Dominance     Extremity/Trunk Assessment Upper Extremity Assessment Upper Extremity Assessment: Generalized weakness   Lower Extremity Assessment Lower Extremity Assessment: Generalized weakness       Communication     Cognition Arousal/Alertness: Lethargic Behavior During Therapy: Flat affect Overall Cognitive Status:  Impaired/Different from baseline Area of Impairment: Orientation, Attention, Memory, Following commands, Safety/judgement, Awareness, Problem solving                 Orientation Level: Disoriented to, Time Current Attention Level: Focused Memory: Decreased short-term memory Following Commands: Follows one step commands with increased time Safety/Judgement: Decreased awareness of deficits Awareness: Intellectual Problem Solving: Slow processing, Requires verbal cues, Requires tactile cues       General Comments  vital signs monitored, appear stable throughout    Exercises Other Exercises Other Exercises: edu re: role of rehab, role of OT, discharge recommendations   Shoulder Instructions      Home Living Family/patient expects to be discharged to:: Private residence Living Arrangements: Spouse/significant other Available Help at Discharge: Family Type of Home: Mobile home Home Access: Ramped entrance     Home Layout: One level               Home Equipment: Cane - single Librarian, academic (2 wheels);Wheelchair - manual   Additional Comments: pt ?historian      Prior Functioning/Environment Prior Level of Function : Independent/Modified Independent             Mobility Comments: mostly household AMB, 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 ADLs Comments: pt endorses MOD I for ADL, ?historian        OT Problem List: Decreased strength;Decreased activity tolerance;Decreased knowledge of use of DME or AE;Decreased safety awareness;Decreased cognition;Impaired balance (sitting and/or standing)      OT Treatment/Interventions: Self-care/ADL training;DME and/or AE instruction;Therapeutic activities;Balance training;Therapeutic exercise;Energy conservation;Patient/family education    OT Goals(Current goals can be found in the care plan section) Acute Rehab OT  Goals Patient Stated Goal: get stronger OT Goal Formulation: With patient Time For Goal Achievement: 08/23/22 Potential to Achieve Goals: Good ADL Goals Pt Will Perform Grooming: with modified independence;sitting;standing Pt Will Perform Lower Body Dressing: with modified independence;sit to/from stand Pt Will Transfer to Toilet: with modified independence;ambulating Pt Will Perform Toileting - Clothing Manipulation and hygiene: with modified independence;sit to/from stand  OT Frequency: Min 2X/week    Co-evaluation              AM-PAC OT "6 Clicks" Daily Activity     Outcome Measure Help from another person eating meals?: A Little Help from another person taking care of personal grooming?: A Little Help from another person toileting, which includes using toliet, bedpan, or urinal?: A Lot Help from another person bathing (including washing, rinsing, drying)?: A Lot Help from another person to put on and taking off regular upper body clothing?: A Little Help from another person to put on and taking off regular lower body clothing?: A Lot 6 Click Score: 15   End of Session Equipment Utilized During Treatment: Oxygen Nurse Communication: Mobility status (IV infusion finished)  Activity Tolerance: Patient tolerated treatment well Patient left: in bed;with call bell/phone within reach;with bed alarm set  OT Visit Diagnosis: Unsteadiness on feet (R26.81);Muscle weakness (generalized) (M62.81)  TimeAS:[id number] OT Time Calculation (min): 12 min Charges:  OT General Charges $OT Visit: 1 Visit OT Evaluation $OT Eval Low Complexity: 1 Low  Shanon Payor, OTD OTR/L  08/09/22, 1:55 PM

## 2022-08-09 NOTE — Assessment & Plan Note (Signed)
-   We will continue his inhalers. 

## 2022-08-09 NOTE — TOC Initial Note (Signed)
Transition of Care [facility]) - Initial/Assessment Note    Patient Details  Name: Jack Rios MRN: [id number] Date of Birth: [birth year]  [facility] CM/SW Contact:    Carmina Miller, LCSWA Phone Number: 08/09/2022, 3:32 PM  Clinical Narrative:                  CSW received consult, spoke with pt's daughter about recommendation for SNF. Pt's daughter states pt was able to walk/was mobile before he came in and feels once he starts to feel better from the flu and is requesting to defer SNF workup at this time and will speak with the family about their thoughts. Pt's daughter states pt lives with his wife who provides care. Pt's daughter states right before he got sick [facility]. CSW advised that RNCM/CSW would follow up with the family tomorrow.         Patient Goals and CMS Choice            Expected Discharge Plan and Services                                              Prior Living Arrangements/Services                       Activities of Daily Living      Permission Sought/Granted                  Emotional Assessment              Admission diagnosis:  Acute respiratory failure with hypoxia (HCC) [J96.01] Patient Active Problem List   Diagnosis Date Noted   Acute respiratory failure with hypoxia (HCC) 08/09/2022   Dyslipidemia 08/09/2022   Asthma, chronic 08/09/2022   BPH (benign prostatic hyperplasia) 08/09/2022   GERD without esophagitis 08/09/2022   Essential hypertension 08/09/2022   Influenza A 08/09/2022   Generalized weakness 08/09/2022   Dementia without behavioral disturbance (HCC) 08/09/2022   Biliary sludge determined by ultrasound    Calculus of gallbladder without cholecystitis without obstruction    Fever    Jaundice    Choledocholithiasis    Common bile duct dilatation    Stenosis of duodenum    Calculus of gallbladder and bile duct without  cholecystitis, with obstruction    Calculus of bile duct with acute cholangitis with obstruction    Transaminitis 05/01/2017   Pneumonia 09/26/2015   Protein-calorie malnutrition, severe 09/26/2015   [facility]:  No Pharmacies Listed    Social Determinants of Health (SDOH) Social History: SDOH Screenings   Tobacco Use: Medium Risk (08/08/2022)   SDOH Interventions:     Readmission Risk Interventions     No data to display

## 2022-08-09 NOTE — Assessment & Plan Note (Signed)
-   We will continue his antihypertensives. 

## 2022-08-09 NOTE — ED Notes (Signed)
Advised nurse that patient has ready bed 

## 2022-08-09 NOTE — Assessment & Plan Note (Signed)
-   We will continue Proscar and alpha-1 blocker therapy.

## 2022-08-09 NOTE — H&P (Signed)
[place]   PATIENT NAME: Jack Rios    MR#:  [id number]  DATE OF BIRTH:  May 26, 1936  DATE OF ADMISSION:  08/08/2022  PRIMARY CARE PHYSICIAN: [facility]   Patient is coming from: Home  REQUESTING/REFERRING PHYSICIAN: Delton Prairie, MD  CHIEF COMPLAINT:   Chief Complaint  Patient presents with   Weakness    HISTORY OF PRESENT ILLNESS:  Jack Rios is a 86 y.o. Caucasian male with medical history significant for asthma, dementia, BPH, GERD, essential hypertension and dyslipidemia, who presented to the emergency room with acute onset of generalized weakness and feeling sick with generalized malaise and myalgias.  He has been having nonproductive cough with associated dyspnea since Christmas.  He is not been able to perform his ADLs as he typically does.  His wife has similar illness.  No chest pain or palpitations.  No nausea or vomiting or diarrhea or abdominal pain.  No dysuria, oliguria or hematuria, urgency or frequency or flank pain.  ED Course: When he came to the ER, BP was 135/92 with heart rate of 102 to, respiratory to 24 and pulse oximetry was 87% on room air and 95% on 4 L of O2 by nasal cannula.  Labs revealed unremarkable BMP.  AST was 52 and ALT 61 CBC showed anemia close to baseline.  Influenza A antigen came back positive, influenza B antigen was negative as well as RSV PCR and COVID-19 PCR.  UA showed 100 protein and was otherwise unremarkable.  Blood cultures were drawn. EKG as reviewed by me : Showed mild sinus tachycardia with a rate of 101. Imaging: Portable chest x-ray showed no acute cardiopulmonary disease. Noncontrast head CT scan revealed general atrophy and microvascular disease without acute intracranial abnormalities. Chest CTA revealed no evidence for PE but showed increased nodularity in the left upper lobe adjacent to the suture line highly suspicious for neoplastic involvement with further workup recommended. Abdominal and pelvic  CT scan with contrast showed diverticulosis without diverticulitis and chronic changes without acute abnormality.  The patient will be admitted to a medical telemetry bed for further evaluation and management. PAST MEDICAL HISTORY:   Past Medical History:  Diagnosis Date   Arthritis    Asthma    Biliary sludge determined by ultrasound    Calculus of bile duct with acute cholangitis with obstruction    Calculus of gallbladder and bile duct without cholecystitis, with obstruction    Calculus of gallbladder without cholecystitis without obstruction    Choledocholithiasis    Common bile duct dilatation    Fever    Jaundice    Lung nodule    Neuropathy    Peptic ulcer    Pneumonia 09/26/2015   Protein-calorie malnutrition, severe 09/26/2015   Stenosis of duodenum    Transaminitis 05/01/2017  -Dyslipidemia - Dementia -BPH - GERD - Essential hypertension PAST SURGICAL HISTORY:   Past Surgical History:  Procedure Laterality Date   CERVICAL SPINE SURGERY     CHOLECYSTECTOMY N/A 05/05/2017   Procedure: LAPAROSCOPIC CHOLECYSTECTOMY WITH INTRAOPERATIVE CHOLANGIOGRAM;  Surgeon: Ricarda Frame, MD;  Location: [facility] ORS;  Service: General;  Laterality: N/A;   ENDOSCOPIC RETROGRADE CHOLANGIOPANCREATOGRAPHY (ERCP) WITH PROPOFOL N/A 05/03/2017   Procedure: ENDOSCOPIC RETROGRADE CHOLANGIOPANCREATOGRAPHY (ERCP) WITH PROPOFOL;  Surgeon: Midge Minium, MD;  Location: [facility] ENDOSCOPY;  Service: Endoscopy;  Laterality: N/A;    SOCIAL HISTORY:   Social History   Tobacco Use   Smoking status: Former    Types: Cigarettes   Smokeless tobacco:  Never  Substance Use Topics   Alcohol use: No    Alcohol/week: 0.0 standard drinks of alcohol    FAMILY HISTORY:   Family History  Problem Relation Age of Onset   Arthritis Brother     DRUG ALLERGIES:   Allergies  Allergen Reactions   Molds & Smuts     REVIEW OF SYSTEMS:   ROS As per history of present illness. All pertinent systems were  reviewed above. Constitutional, HEENT, cardiovascular, respiratory, GI, GU, musculoskeletal, neuro, psychiatric, endocrine, integumentary and hematologic systems were reviewed and are otherwise negative/unremarkable except for positive findings mentioned above in the HPI.   MEDICATIONS AT HOME:   Prior to Admission medications   Medication Sig Start Date End Date Taking? Authorizing Provider  acetaminophen (TYLENOL) 500 MG tablet Take 500 mg by mouth every 6 (six) hours as needed.   Yes [provider]  albuterol (PROVENTIL HFA;VENTOLIN HFA) 108 (90 Base) MCG/ACT inhaler Inhale 2 puffs into the lungs every 4 (four) hours as needed for wheezing or shortness of breath.   Yes [provider]  aspirin 81 MG chewable tablet Chew 81 mg by mouth daily.   Yes [provider]  atorvastatin (LIPITOR) 40 MG tablet Take 0.5 tablets by mouth at bedtime. 07/20/22  Yes [provider]  cetirizine (ZYRTEC) 5 MG tablet Take 5 mg by mouth daily. 02/09/22  Yes [provider]  chlorpheniramine (CHLOR-TRIMETON) 4 MG tablet Take 4 mg by mouth at bedtime.   Yes [provider]  docusate sodium (COLACE) 100 MG capsule Take 100 mg by mouth daily.    Yes [provider]  donepezil (ARICEPT) 10 MG tablet Take 1 tablet by mouth daily. 02/09/22  Yes [provider]  ferrous sulfate 325 (65 FE) MG tablet Take 325 mg by mouth every evening.    Yes [provider]  finasteride (PROSCAR) 5 MG tablet Take 5 mg by mouth daily.   Yes [provider]  fluticasone (FLONASE) 50 MCG/ACT nasal spray Place 2 sprays into both nostrils 2 (two) times daily.   Yes [provider]  gabapentin (NEURONTIN) 300 MG capsule Take 300 mg by mouth 3 (three) times daily. 07/20/22  Yes [provider]  hydroxyurea (HYDREA) 500 MG capsule Take 500 mg by mouth at bedtime. May take with food to minimize GI side effects.   Yes [provider]   ibuprofen (ADVIL) 200 MG tablet Take 200 mg by mouth every 6 (six) hours as needed.   Yes [provider]  ipratropium (ATROVENT) 0.06 % nasal spray Place 2 sprays into both nostrils every 12 (twelve) hours.   Yes [provider]  loratadine (CLARITIN) 10 MG tablet Take 10 mg by mouth daily. 07/20/22  Yes [provider]  losartan (COZAAR) 25 MG tablet Take 1 tablet by mouth daily. 07/20/22  Yes [provider]  mometasone (ASMANEX 60 METERED DOSES) 220 MCG/INH inhaler Inhale 2 puffs into the lungs daily.   Yes [provider]  pantoprazole (PROTONIX) 40 MG tablet Take 1 tablet by mouth daily. 07/20/22  Yes [provider]  tamsulosin (FLOMAX) 0.4 MG CAPS capsule Take 0.4 mg by mouth daily. 07/20/22  Yes [provider]  terazosin (HYTRIN) 10 MG capsule Take 10 mg by mouth at bedtime.   Yes [provider]  tiotropium (SPIRIVA) 18 MCG inhalation capsule Place 18 mcg into inhaler and inhale daily.   Yes [provider]  Vitamin D, Ergocalciferol, (DRISDOL) 1.25 MG (50000  UNIT) CAPS capsule Take 50,000 Units by mouth every 7 (seven) days.   Yes [provider]  hydroxypropyl methylcellulose / hypromellose (ISOPTO TEARS / GONIOVISC) 2.5 % ophthalmic solution Place 1 drop into both eyes as needed for dry eyes.     [provider]  traMADol (ULTRAM) 50 MG tablet Take 50 mg by mouth every 12 (twelve) hours as needed for moderate pain.  Patient not taking: Reported on 08/09/2022    [provider]      VITAL SIGNS:  Blood pressure (!) 149/74, pulse (!) 109, temperature (!) 102.1 F (38.9 C), temperature source Oral, resp. rate (!) 28, height 5\' 10"  (1.778 m), weight 86.2 kg, SpO2 97 %.  PHYSICAL EXAMINATION:  Physical Exam  GENERAL:  86 y.o.-year-old Caucasian male patient lying in the bed with no acute distress.  He was somnolent but arousable. EYES: Pupils equal, round, reactive to light  and accommodation. No scleral icterus. Extraocular muscles intact.  HEENT: Head atraumatic, normocephalic. Oropharynx and nasopharynx clear.  NECK:  Supple, no jugular venous distention. No thyroid enlargement, no tenderness.  LUNGS: Normal breath sounds bilaterally, no wheezing, rales,rhonchi or crepitation. No use of accessory muscles of respiration.  CARDIOVASCULAR: Regular rate and rhythm, S1, S2 normal. No murmurs, rubs, or gallops.  ABDOMEN: Soft, nondistended, nontender. Bowel sounds present. No organomegaly or mass.  EXTREMITIES: No pedal edema, cyanosis, or clubbing.  NEUROLOGIC: No lateralizing signs. PSYCHIATRIC: The patient is lethargic and somnolent. SKIN: No obvious rash, lesion, or ulcer.   LABORATORY PANEL:   CBC Recent Labs  Lab 08/09/22 0246  WBC 6.6  HGB 10.5*  HCT 31.2*  PLT 363   ------------------------------------------------------------------------------------------------------------------  Chemistries  Recent Labs  Lab 08/08/22 2333 08/09/22 0246  NA 140 138  K 3.6 3.8  CL 107 106  CO2 25 25  GLUCOSE 126* 124*  BUN 20 18  CREATININE 0.98 0.98  CALCIUM 8.5* 8.1*  AST 52*  --   ALT 61*  --   ALKPHOS 70  --   BILITOT 1.2  --    ------------------------------------------------------------------------------------------------------------------  Cardiac Enzymes No results for input(s): "TROPONINI" in the last 168 hours. ------------------------------------------------------------------------------------------------------------------  RADIOLOGY:  CT Angio Chest PE W and/or Wo Contrast  Result Date: 08/09/2022 CLINICAL DATA:  New onset hypoxia and tachycardia, generalized weakness EXAM: CT ANGIOGRAPHY CHEST CT ABDOMEN AND PELVIS WITH CONTRAST TECHNIQUE: Multidetector CT imaging of the chest was performed using the standard protocol during bolus administration of intravenous contrast. Multiplanar CT image reconstructions and MIPs were obtained to  evaluate the vascular anatomy. Multidetector CT imaging of the abdomen and pelvis was performed using the standard protocol during bolus administration of intravenous contrast. RADIATION DOSE REDUCTION: This exam was performed according to the departmental dose-optimization program which includes automated exposure control, adjustment of the mA and/or kV according to patient size and/or use of iterative reconstruction technique. CONTRAST:  08/11/2022 OMNIPAQUE IOHEXOL 350 MG/ML SOLN COMPARISON:  Chest x-ray from the previous day, PET-CT from 09/25/2019. FINDINGS: CTA CHEST FINDINGS Cardiovascular: Thoracic aorta demonstrates atherosclerotic calcifications. No aneurysmal dilatation or dissection is seen. No cardiac enlargement is noted. No pericardial effusion is seen. Mild coronary calcifications are noted. The pulmonary artery shows a normal branching pattern without intraluminal filling defect to suggest pulmonary embolism. Mediastinum/Nodes: Thoracic inlet is within normal limits. Esophagus is partially fluid-filled likely related to reflux. No hilar or mediastinal adenopathy is noted. Lungs/Pleura: Lungs are well aerated bilaterally. Mild emphysematous changes are seen. Basilar atelectasis is noted. In the left upper  lobe there are changes consistent with prior resection with a surgical suture line superiorly. Additionally there is increasing soft tissue in the apex adjacent to the suture line which could be progressive neoplastic involvement. Musculoskeletal: No acute rib abnormality is noted. Degenerative changes of the thoracic spine are noted. Midthoracic hemangioma is seen. Review of the MIP images confirms the above findings. CT ABDOMEN and PELVIS FINDINGS Hepatobiliary: No focal liver abnormality is seen. Status post cholecystectomy. No biliary dilatation. Pancreas: Unremarkable. No pancreatic ductal dilatation or surrounding inflammatory changes. Spleen: Normal in size without focal abnormality.  Adrenals/Urinary Tract: Adrenal glands are within normal limits. Multiple simple appearing cysts are noted bilaterally stable from the prior study. No further follow-up is recommended. Atherosclerotic calcifications of the renal arteries are seen. No renal calculi or obstructive changes are noted. The bladder is partially distended. Stomach/Bowel: Diverticular change of the colon is noted without evidence of diverticulitis. No obstructive changes are seen. The appendix is partially visualized. Small bowel and stomach are within normal limits. Vascular/Lymphatic: Aortic atherosclerosis. No enlarged abdominal or pelvic lymph nodes. Reproductive: Prostate is unremarkable. Other: No abdominal wall hernia or abnormality. No abdominopelvic ascites. Musculoskeletal: No acute or significant osseous findings. Review of the MIP images confirms the above findings. IMPRESSION: CTA of the chest: No evidence of pulmonary emboli. Increasing nodularity in the left upper lobe adjacent to a suture line highly suspicious for neoplastic involvement. Further workup is recommended. CT of the abdomen and pelvis: Diverticulosis without diverticulitis. Chronic changes without acute abnormality. Electronically Signed   By: Alcide Clever M.D.   On: 08/09/2022 01:16   CT ABDOMEN PELVIS W CONTRAST  Result Date: 08/09/2022 CLINICAL DATA:  New onset hypoxia and tachycardia, generalized weakness EXAM: CT ANGIOGRAPHY CHEST CT ABDOMEN AND PELVIS WITH CONTRAST TECHNIQUE: Multidetector CT imaging of the chest was performed using the standard protocol during bolus administration of intravenous contrast. Multiplanar CT image reconstructions and MIPs were obtained to evaluate the vascular anatomy. Multidetector CT imaging of the abdomen and pelvis was performed using the standard protocol during bolus administration of intravenous contrast. RADIATION DOSE REDUCTION: This exam was performed according to the departmental dose-optimization program which  includes automated exposure control, adjustment of the mA and/or kV according to patient size and/or use of iterative reconstruction technique. CONTRAST:  OMNIPAQUE IOHEXOL 350 MG/ML SOLN COMPARISON:  Chest x-ray from the previous day, PET-CT from 09/25/2019. FINDINGS: CTA CHEST FINDINGS Cardiovascular: Thoracic aorta demonstrates atherosclerotic calcifications. No aneurysmal dilatation or dissection is seen. No cardiac enlargement is noted. No pericardial effusion is seen. Mild coronary calcifications are noted. The pulmonary artery shows a normal branching pattern without intraluminal filling defect to suggest pulmonary embolism. Mediastinum/Nodes: Thoracic inlet is within normal limits. Esophagus is partially fluid-filled likely related to reflux. No hilar or mediastinal adenopathy is noted. Lungs/Pleura: Lungs are well aerated bilaterally. Mild emphysematous changes are seen. Basilar atelectasis is noted. In the left upper lobe there are changes consistent with prior resection with a surgical suture line superiorly. Additionally there is increasing soft tissue in the apex adjacent to the suture line which could be progressive neoplastic involvement. Musculoskeletal: No acute rib abnormality is noted. Degenerative changes of the thoracic spine are noted. Midthoracic hemangioma is seen. Review of the MIP images confirms the above findings. CT ABDOMEN and PELVIS FINDINGS Hepatobiliary: No focal liver abnormality is seen. Status post cholecystectomy. No biliary dilatation. Pancreas: Unremarkable. No pancreatic ductal dilatation or surrounding inflammatory changes. Spleen: Normal in size without focal abnormality. Adrenals/Urinary Tract: Adrenal glands  are within normal limits. Multiple simple appearing cysts are noted bilaterally stable from the prior study. No further follow-up is recommended. Atherosclerotic calcifications of the renal arteries are seen. No renal calculi or obstructive changes are noted. The  bladder is partially distended. Stomach/Bowel: Diverticular change of the colon is noted without evidence of diverticulitis. No obstructive changes are seen. The appendix is partially visualized. Small bowel and stomach are within normal limits. Vascular/Lymphatic: Aortic atherosclerosis. No enlarged abdominal or pelvic lymph nodes. Reproductive: Prostate is unremarkable. Other: No abdominal wall hernia or abnormality. No abdominopelvic ascites. Musculoskeletal: No acute or significant osseous findings. Review of the MIP images confirms the above findings. IMPRESSION: CTA of the chest: No evidence of pulmonary emboli. Increasing nodularity in the left upper lobe adjacent to a suture line highly suspicious for neoplastic involvement. Further workup is recommended. CT of the abdomen and pelvis: Diverticulosis without diverticulitis. Chronic changes without acute abnormality. Electronically Signed   By: Alcide Clever M.D.   On: 08/09/2022 01:16   CT HEAD WO CONTRAST ( )  Result Date: 08/09/2022 CLINICAL DATA:  Generalized weakness. EXAM: CT HEAD WITHOUT CONTRAST TECHNIQUE: Contiguous axial images were obtained from the base of the skull through the vertex without intravenous contrast. RADIATION DOSE REDUCTION: This exam was performed according to the departmental dose-optimization program which includes automated exposure control, adjustment of the mA and/or kV according to patient size and/or use of iterative reconstruction technique. COMPARISON:  None Available. FINDINGS: Brain: There is mild to moderate severity cerebral atrophy with widening of the extra-axial spaces and ventricular dilatation. There are areas of decreased attenuation within the white matter tracts of the supratentorial brain, consistent with microvascular disease changes. Vascular: No hyperdense vessel or unexpected calcification. Skull: Normal. Negative for fracture or focal lesion. 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 Other: None. IMPRESSION: 1. No acute intracranial abnormality. 2. Cerebral atrophy and microvascular disease changes of the supratentorial brain. Electronically Signed   By: Aram Candela M.D.   On: 08/09/2022 01:12   DG Chest Port 1 View  Result Date: 08/08/2022 CLINICAL DATA:  Cough and weakness EXAM: PORTABLE CHEST 1 VIEW COMPARISON:  04/30/17 FINDINGS: Cardiac shadow is within normal limits. Lungs are well aerated bilaterally. Mild interstitial changes are noted without focal infiltrate. No bony abnormality is seen. IMPRESSION: No acute abnormality noted. Electronically Signed   By: Alcide Clever M.D.   On: 08/08/2022 23:47      IMPRESSION AND PLAN:  Assessment and Plan: * Acute respiratory failure with hypoxia (HCC) - This is secondary to influenza A without clear evidence for pneumonia. - The patient will be admitted to a medical telemetry bed. - O2 protocol will be followed. - We will place him on p.o. Tamiflu.  Influenza A - He will be placed on p.o. Tamiflu.  Essential hypertension - We will continue his antihypertensives.  Dementia without behavioral disturbance (HCC) - We will continue Aricept.  GERD without esophagitis - We will continue PPI therapy.  BPH (benign prostatic hyperplasia) - We will continue Proscar and alpha-1 blocker therapy.  Asthma, chronic - We will continue his inhalers.  Dyslipidemia - We will continue statin therapy.    DVT prophylaxis: Lovenox.  Advanced Care Planning:  Code Status: He is DNR/DNI.  This was discussed with his family..  Family Communication:  The plan of care was discussed in details with the patient (and family). I answered all questions. The patient agreed to proceed  with the above mentioned plan. Further management will depend upon hospital course. Disposition Plan: Back to previous home environment Consults called: none.  All the records are reviewed and case discussed with ED  provider.  Status is: Inpatient   At the time of the admission, it appears that the appropriate admission status for this patient is inpatient.  This is judged to be reasonable and necessary in order to provide the required intensity of service to ensure the patient's safety given the presenting symptoms, physical exam findings and initial radiographic and laboratory data in the context of comorbid conditions.  The patient requires inpatient status due to high intensity of service, high risk of further deterioration and high frequency of surveillance required.  I certify that at the time of admission, it is my clinical judgment that the patient will require inpatient hospital care extending more than 2 midnights.                            Dispo: The patient is from: Home              Anticipated d/c is to: Home              Patient currently is not medically stable to d/c.              Difficult to place patient: No  Hannah Beat M.D on 08/09/2022 at 5:35 AM  [facility]   From 7 PM-7 AM, contact night-coverage [email or web address]  CC: Primary care physician; [facility]

## 2022-08-09 NOTE — ED Notes (Signed)
Pt brief and linen changed.  Pt repositioned.  Family remains at bedside.

## 2022-08-09 NOTE — ED Notes (Signed)
MD [place] advised of rectal temp 101.9 and last admin PO Tylenol at 0300. States ok to give second dose of Tylenol.

## 2022-08-10 ENCOUNTER — Inpatient Hospital Stay: Payer: No Typology Code available for payment source

## 2022-08-10 DIAGNOSIS — J9601 Acute respiratory failure with hypoxia: Secondary | ICD-10-CM

## 2022-08-10 LAB — CBC WITH DIFFERENTIAL/PLATELET
Abs Immature Granulocytes: 0.04 10*3/uL (ref 0.00–0.07)
Basophils Absolute: 0 10*3/uL (ref 0.0–0.1)
Basophils Relative: 0 %
Eosinophils Absolute: 0 10*3/uL (ref 0.0–0.5)
Eosinophils Relative: 0 %
HCT: 29.1 % — ABNORMAL LOW (ref 39.0–52.0)
Hemoglobin: 10 g/dL — ABNORMAL LOW (ref 13.0–17.0)
Immature Granulocytes: 1 %
Lymphocytes Relative: 4 %
Lymphs Abs: 0.3 10*3/uL — ABNORMAL LOW (ref 0.7–4.0)
MCH: 40.2 pg — ABNORMAL HIGH (ref 26.0–34.0)
MCHC: 34.4 g/dL (ref 30.0–36.0)
MCV: 116.9 fL — ABNORMAL HIGH (ref 80.0–100.0)
Monocytes Absolute: 0.8 10*3/uL (ref 0.1–1.0)
Monocytes Relative: 11 %
Neutro Abs: 6.4 10*3/uL (ref 1.7–7.7)
Neutrophils Relative %: 84 %
Platelets: 319 10*3/uL (ref 150–400)
RBC: 2.49 MIL/uL — ABNORMAL LOW (ref 4.22–5.81)
RDW: 16.8 % — ABNORMAL HIGH (ref 11.5–15.5)
Smear Review: NORMAL
WBC: 7.6 10*3/uL (ref 4.0–10.5)
nRBC: 0 % (ref 0.0–0.2)

## 2022-08-10 LAB — BASIC METABOLIC PANEL
Anion gap: 10 (ref 5–15)
BUN: 24 mg/dL — ABNORMAL HIGH (ref 8–23)
CO2: 23 mmol/L (ref 22–32)
Calcium: 8.1 mg/dL — ABNORMAL LOW (ref 8.9–10.3)
Chloride: 104 mmol/L (ref 98–111)
Creatinine, Ser: 1.33 mg/dL — ABNORMAL HIGH (ref 0.61–1.24)
GFR, Estimated: 53 mL/min — ABNORMAL LOW (ref >60)
Glucose, Bld: 111 mg/dL — ABNORMAL HIGH (ref 70–99)
Potassium: 3.8 mmol/L (ref 3.5–5.1)
Sodium: 137 mmol/L (ref 135–145)

## 2022-08-10 LAB — URINE CULTURE: Culture: NO GROWTH

## 2022-08-10 LAB — PROCALCITONIN: Procalcitonin: 2.34 ng/mL

## 2022-08-10 MED ORDER — OXYCODONE HCL 5 MG PO TABS
5.0000 mg | ORAL_TABLET | ORAL | Status: DC | PRN
  Administered 2022-08-10: 5 mg via ORAL
  Filled 2022-08-10: qty 1

## 2022-08-10 MED ORDER — MORPHINE SULFATE (PF) 2 MG/ML IV SOLN: 1.0000 mg | INTRAVENOUS | Status: DC | PRN

## 2022-08-10 MED ORDER — FUROSEMIDE 10 MG/ML IJ SOLN
40.0000 mg | INTRAMUSCULAR | Status: AC
  Administered 2022-08-10: 40 mg via INTRAVENOUS

## 2022-08-10 MED ORDER — HALOPERIDOL LACTATE 5 MG/ML IJ SOLN
2.0000 mg | Freq: Four times a day (QID) | INTRAMUSCULAR | Status: DC | PRN
  Administered 2022-08-13 (×2): 2 mg via INTRAVENOUS
  Filled 2022-08-10 (×2): qty 1

## 2022-08-10 MED ORDER — FUROSEMIDE 10 MG/ML IJ SOLN
INTRAMUSCULAR | Status: AC
  Filled 2022-08-10: qty 4

## 2022-08-10 MED ORDER — ARFORMOTEROL TARTRATE 15 MCG/2ML IN NEBU
15.0000 ug | INHALATION_SOLUTION | Freq: Two times a day (BID) | RESPIRATORY_TRACT | Status: DC
  Administered 2022-08-10 – 2022-08-14 (×8): 15 ug via RESPIRATORY_TRACT
  Filled 2022-08-10 (×11): qty 2

## 2022-08-10 NOTE — Progress Notes (Signed)
Called rapid response this morning d/t patient having SOB and desaturation. Patient was 70% on 2 LPM via n/c, turned O2 up to 6 LPM and patient only went to 80%. Patient was then placed on nonrebreather and then high flow O2 at 12 LPM. MD and rapid response nurse came to bedside. MD placed new orders.

## 2022-08-10 NOTE — Progress Notes (Signed)
   08/10/22 0404  Assess: MEWS Score  Temp (!) 101.4 F (38.6 C)  BP 131/69  Pulse Rate (!) 110  Resp 20  Level of Consciousness Alert  SpO2 90 %  O2 Device Nasal Cannula  O2 Flow Rate (L/min) 3 L/min  Assess: MEWS Score  MEWS Temp 1  MEWS Systolic 0  MEWS Pulse 1  MEWS RR 0  MEWS LOC 0  MEWS Score 2  MEWS Score Color Yellow  Assess: if the MEWS score is Yellow or Red  Were vital signs taken at a resting state? Yes  Focused Assessment Change from prior assessment (see assessment flowsheet)  Does the patient meet 2 or more of the SIRS criteria? No  MEWS guidelines implemented *See Row Information* Yes  Treat  MEWS Interventions Administered prn meds/treatments  Take Vital Signs  Increase Vital Sign Frequency  Yellow: Q 2hr X 2 then Q 4hr X 2, if remains yellow, continue Q 4hrs  Notify: Charge Nurse/RN  Name of Charge Nurse/RN Notified Earlie Server, RN  Date Charge Nurse/RN Notified 08/10/22  Time Charge Nurse/RN Notified 0407  Provider Notification  Provider Name/Title Neomia Glass, NP  Date Provider Notified 08/10/22  Time Provider Notified 0407  Method of Notification Page  Notification Reason Other (Comment) (yellow mews)  Assess: SIRS CRITERIA  SIRS Temperature  1  SIRS Pulse 1  SIRS Respirations  0  SIRS WBC 0  SIRS Score Sum  2   Will continue to monitor per MEWS protocol

## 2022-08-10 NOTE — Progress Notes (Signed)
   08/10/22 0915  Assess: MEWS Score  Level of Consciousness Alert  SpO2 (!) 70 % (called RAPID)  O2 Device Nasal Cannula  O2 Flow Rate (L/min) 2 L/min  Assess: MEWS Score  MEWS Temp 0  MEWS Systolic 0  MEWS Pulse 1  MEWS RR 0  MEWS LOC 0  MEWS Score 1  MEWS Score Color Green  Notify: Charge Nurse/RN  Name of Charge Nurse/RN Notified Montey Hora, RN  Date Charge Nurse/RN Notified 08/10/22  Time Charge Nurse/RN Notified 0915  Provider Notification  Provider Name/Title [place]. [place]  Date Provider Notified 08/10/22  Time Provider Notified 0915  Method of Notification Page (called rapid)  Notification Reason Change in status  Provider response At bedside;See new orders  Date of Provider Response 08/10/22  Time of Provider Response [phone]  Notify: Rapid Response  Name of Rapid Response RN Notified Charlie, RN / ICU  Date Rapid Response Notified 08/10/22  Time Rapid Response Notified 0915  Document  Patient Outcome Stabilized after interventions (placed on high flow oxygen and givem prescribed medicaion.)  Progress note created (see row info) Yes

## 2022-08-10 NOTE — Progress Notes (Signed)
   08/10/22 0624  Assess: MEWS Score  Temp 99.6 F (37.6 C)  BP 109/62  Pulse Rate (!) 105  Resp 20  SpO2 92 %  O2 Device Nasal Cannula  O2 Flow Rate (L/min) 3 L/min  Assess: MEWS Score  MEWS Temp 0  MEWS Systolic 0  MEWS Pulse 1  MEWS RR 0  MEWS LOC 0  MEWS Score 1  MEWS Score Color Green  Assess: if the MEWS score is Yellow or Red  Were vital signs taken at a resting state? Yes  Assess: SIRS CRITERIA  SIRS Temperature  0  SIRS Pulse 1  SIRS Respirations  0  SIRS WBC 0  SIRS Score Sum  1   Pt mews now green. Continue to assess per protocol

## 2022-08-10 NOTE — Significant Event (Signed)
Rapid Response Event Note   Reason for Call :  Respiratory distress  Initial Focused Assessment:   Per Bedside RN- Debi - patient oxygen saturation was in the 70's on 3 liters of oxygen-     Interventions:  Patient placed on nonrebreather- vitals stable- oxygen 97%- pt had audible crackles in lungs-   Plan of Care:  MD- [place] at bedside- he ordered 40mg  of lasix, and labs to be drawn.  Respiratory placed patient on heated high flow nasal cannula.    Event Summary:   MD Notified: [place] Call Time:09:28 Arrival Time:09:29 End Time:09:40  Reggie Pile, RN

## 2022-08-10 NOTE — Progress Notes (Signed)
Patient had difficulty urinating all day and patients abdomen has been distended and tight. Made MD aware. New order to bladder scan and do in and out cath. Bladder scan showed >700, in and out cath done and there was some blood and clots noted. Made MD aware of same. Later patient was c/o lower abdominal pain and again could not urinate. Bladder scanned patient and there was 400. Messaged MD again and new order for Foley placement. When Foley was placed the patient was crying and yelling in pain and there was copious amount of clots coming out. Made MD aware of same. New orders placed.

## 2022-08-10 NOTE — Progress Notes (Signed)
   08/10/22 0939  Assess: MEWS Score  Temp 98.8 F (37.1 C)  BP (!) 141/75  MAP (mmHg) 94  Pulse Rate 96  ECG Heart Rate (!) 104  Resp (!) 24  Level of Consciousness Alert  SpO2 97 %  O2 Device Non-rebreather Mask  O2 Flow Rate (L/min) 15 L/min  Assess: MEWS Score  MEWS Temp 0  MEWS Systolic 0  MEWS Pulse 1  MEWS RR 1  MEWS LOC 0  MEWS Score 2  MEWS Score Color Yellow  Assess: if the MEWS score is Yellow or Red  Were vital signs taken at a resting state? Yes  Focused Assessment Change from prior assessment (see assessment flowsheet)  Does the patient meet 2 or more of the SIRS criteria? Yes  Does the patient have a confirmed or suspected source of infection? No  MEWS guidelines implemented *See Row Information* Yes  Treat  MEWS Interventions Other (Comment) (Resp suctioned and ordered med given)  Pain Scale 0-10  Pain Score 0  Take Vital Signs  Increase Vital Sign Frequency  Yellow: Q 2hr X 2 then Q 4hr X 2, if remains yellow, continue Q 4hrs  Escalate  MEWS: Escalate Yellow: discuss with charge nurse/RN and consider discussing with provider and RRT  Notify: Charge Nurse/RN  Name of Charge Nurse/RN Notified Montey Hora, RN  Date Charge Nurse/RN Notified 08/10/22  Time Charge Nurse/RN Notified [phone]  Provider Notification  Provider Name/Title Dr. [place]  Date Provider Notified 08/10/22  Time Provider Notified [phone]  Method of Notification Face-to-face  Notification Reason Other (Comment) (reviewed vitals)  Provider response At bedside  Date of Provider Response 08/10/22  Time of Provider Response [phone]  Document  Patient Outcome Stabilized after interventions  Progress note created (see row info) Yes  Assess: SIRS CRITERIA  SIRS Temperature  0  SIRS Pulse 1  SIRS Respirations  1  SIRS WBC 0  SIRS Score Sum  2

## 2022-08-10 NOTE — Progress Notes (Signed)
   08/10/22 0900  Clinical Encounter Type  Visited With Family;Health care provider  Visit Type Initial;Code   At 0930 Chaplain responded to Rapid Response Code. Initially no family present however family arrived shortly after code was called. Chaplain services available if further assistance is needed.

## 2022-08-10 NOTE — Progress Notes (Signed)
       CROSS COVER NOTE  NAME: Jack Rios MRN: [id number] DOB : 1935-10-31 ATTENDING PHYSICIAN: Sidney Ace Arvella Merles, MD    Date of Service   08/10/2022   HPI/Events of Note   Notified by nursing staff that Mr Cruise has new crackles on auscultation of lungs.  Interventions   Assessment/Plan: Hold IVF CXR--> negative for pulmonary vascular congestion       To reach the provider On-Call:   7AM- 7PM see care teams to locate the attending and reach out to them via [email or web address]. 7PM-7AM contact night-coverage If you still have difficulty reaching the appropriate provider, please page the [facility] (Director on Call) for [facility] on amion for assistance  This document was prepared using Set designer software and may include unintentional dictation errors.  Neomia Glass DNP, MBA, FNP-BC Nurse Practitioner [facility] Pager [phone]

## 2022-08-10 NOTE — Progress Notes (Signed)
Pt's family has provided briefs. They are requesting we use them for pt.

## 2022-08-10 NOTE — Progress Notes (Signed)
PROGRESS NOTE    Jack ProudJames R Chaviano  ZOX:[id number] DOB: 02-11-1936 DOA: 08/08/2022 PCP: [facility]    Brief Narrative:   Jack Rios is a 87 y.o. male with past medical history significant for asthma/COPD, dementia, BPH, essential hypertension, dyslipidemia, GERD who presented to [facility] ED on 12/30 with generalized weakness, myalgias, nonproductive cough, and shortness of breath.  Family reports initial onset Christmas Day with continued overall decline.  Family reports his wife has similar illness.  Patient denies chest pain, no palpitations, no vomiting/diarrhea, no abdominal pain, no urinary symptoms.  On EMS arrival patient's SpO2 was noted to be 89% on room air, not oxygen dependent at baseline.  Patient was transported to the ED for further evaluation  In the ED, temperature 98.0 F, HR 102, RR 24, BP 135/92, SpO2 95% on 4 L nasal cannula.  WBC 6.0, hemoglobin 10.4, platelets 353.  Sodium 140, potassium 3.6, chloride 107, CO2 25, glucose 126, BUN 20, creatinine 0.98.  AST 52, ALT 61, total bilirubin 1.2.  Lactic acid 1.8.  Procalcitonin 0.19.  Influenza A PCR positive.  RSV PCR negative.  COVID-19 PCR negative.  Urinalysis unrevealing.  CT head without contrast with no acute intracranial abnormality, noted cerebral atrophy and microvascular disease.  Chest x-ray with no acute cardiopulmonary disease process.  CT angiogram chest negative for pulmonary embolism, increasing nodularity left upper lobe suspicious for neoplastic involvement.  CT abdomen/pelvis with diverticulosis without diverticulitis, no acute abnormality.  Urine culture and blood cultures obtained.  EDP consulted [facility] for admission for further evaluation management of acute hypoxic respiratory failure secondary to influenza A viral infection.  Assessment & Plan:   Acute hypoxic respiratory failure, POA Influenza A viral infection Hx asthma/COPD Patient presenting to ED with progressive weakness/malaise, myalgias,  shortness of breath and nonproductive cough.  Patient with sick contacts, wife with similar symptoms.  Patient was noted be hypoxic with SpO2 89% on room air on EMS arrival.  Patient with Tmax 102.1.  Influenza a PCR positive.  COVID/RSV PCR negative.  No leukocytosis.  CT angiogram chest negative for PE or concern for infiltrate. -- Tamiflu 30 mg p.o. twice daily -- Budesonide neb twice daily -- Brovana neb twice daily -- Spiriva -- Albuterol neb every 4 hours as needed shortness of breath/wheezing -- Nasotracheal suction as needed -- Continue supplemental oxygen, maintain SpO2 greater than 88%  Left upper lobe nodularity Patient with history of tobacco abuse, CT angiogram with finding of increased nodularity left upper lobe suspicious for neoplastic involvement.  Outpatient follow-up with PCP for further evaluation.  Essential hypertension -- Losartan 25 mg p.o. daily -- Continue aspirin and statin  Dyslipidemia -- Atorvastatin 20 mg p.o. daily  BPH:  -- Finasteride 5 mg p.o. daily -- Tamsulosin 0.4 mg p.o. daily  GERD -- Protonix 40 mg p.o. daily  Iron deficiency anemia: Ferrous sulfate 325 mg p.o. daily  Dementia --Delirium precautions --Get up during the day --Encourage a familiar face to remain present throughout the day --Keep blinds open and lights on during daylight hours --Minimize the use of opioids/benzodiazepines  Weakness/debility/deconditioning: Seen by PT and OT with recommendations of SNF placement. --TOC consulted   DVT prophylaxis: enoxaparin (LOVENOX) injection 40 mg Start: 08/09/22 1000    Code Status: DNR Family Communication: Updated daughter present at bedside this morning  Disposition Plan:  Level of care: Telemetry Medical Status is: Inpatient Remains inpatient appropriate because: Will need SNF placement    Consultants:  None  Procedures:  None  Antimicrobials:  Tamiflu 12/30>>   Subjective: Patient seen examined bedside.  Rapid  response called earlier this morning for desaturation.  RN reported that patient saturating 70% on 2 L nasal cannula and was placed on nonrebreather.  Recently had breathing treatment by respiratory therapy prior in which he was maintaining fine.  Repeat labs are reassuring other than slight bump in creatinine with noted urinary retention on bladder scan in which she will receive an In-N-Out catheterization.  Now transition to high flow nasal cannula at 11 L with adequate oxygenation.  Received 1 dose of IV Lasix.  Discontinued IV fluids.  Appears more comfortable at this time.  Patient with no other complaints at this time.  Denies headache, no chest pain, no abdominal pain.  No other acute concerns currently per RN.   Objective: Vitals:   08/10/22 0858 08/10/22 0900 08/10/22 0915 08/10/22 0939  BP:    (!) 141/75  Pulse: (!) 106 (!) 103  96  Resp: (!) 24 20  (!) 24  Temp:    98.8 F (37.1 C)  TempSrc:      SpO2: 90%  (!) 70% 97%  Weight:      Height:        Intake/Output Summary (Last 24 hours) at 08/10/2022 1144 Last data filed at 08/10/2022 0616 Gross per 24 hour  Intake 2040.52 ml  Output --  Net 2040.52 ml   Filed Weights   08/08/22 2321  Weight: 86.2 kg    Examination:  Physical Exam: GEN: alert, alert to self/time, pleasantly confused, mild respiratory distress, chronically ill/elderly in appearance HEENT: NCAT, PERRL, EOMI, sclera clear, MMM PULM: Breath sounds diminished bilateral bases with diffuse wheezing/crackles throughout all lung fields, slightly increased respiratory effort without accessory muscle use, on 11 L HFNC CV: Tachycardic, regular rhythm w/o M/G/R GI: abd soft, NTND, NABS, no R/G/M MSK: no peripheral edema, moves all extremities independently NEURO: No focal neurological deficit identified PSYCH: Depressed mood, flat affect Integumentary: dry/intact, no rashes or wounds    Data Reviewed: I have personally reviewed following labs and imaging  studies  CBC: Recent Labs  Lab 08/08/22 2333 08/09/22 0246 08/10/22 0936  WBC 6.0 6.6 7.6  NEUTROABS 4.9  --  PENDING  HGB 10.4* 10.5* 10.0*  HCT 30.2* 31.2* 29.1*  MCV 117.5* 118.2* 116.9*  PLT 353 363 319   Basic Metabolic Panel: Recent Labs  Lab 08/08/22 2333 08/09/22 0246 08/10/22 0936  NA 140 138 137  K 3.6 3.8 3.8  CL 107 106 104  CO2 25 25 23   GLUCOSE 126* 124* 111*  BUN 20 18 24*  CREATININE 0.98 0.98 1.33*  CALCIUM 8.5* 8.1* 8.1*   GFR: Estimated Creatinine Clearance: 42.7 mL/min (A) (by C-G formula based on SCr of 1.33 mg/dL (H)). Liver Function Tests: Recent Labs  Lab 08/08/22 2333  AST 52*  ALT 61*  ALKPHOS 70  BILITOT 1.2  PROT 7.2  ALBUMIN 4.2   No results for input(s): "LIPASE", "AMYLASE" in the last 168 hours. No results for input(s): "AMMONIA" in the last 168 hours. Coagulation Profile: Recent Labs  Lab 08/08/22 2333  INR 1.3*   Cardiac Enzymes: No results for input(s): "CKTOTAL", "CKMB", "CKMBINDEX", "TROPONINI" in the last 168 hours. BNP (last 3 results) No results for input(s): "PROBNP" in the last 8760 hours. HbA1C: No results for input(s): "HGBA1C" in the last 72 hours. CBG: No results for input(s): "GLUCAP" in the last 168 hours. Lipid Profile: No results for input(s): "CHOL", "HDL", "LDLCALC", "TRIG", "  CHOLHDL", "LDLDIRECT" in the last 72 hours. Thyroid Function Tests: No results for input(s): "TSH", "T4TOTAL", "FREET4", "T3FREE", "THYROIDAB" in the last 72 hours. Anemia Panel: No results for input(s): "VITAMINB12", "FOLATE", "FERRITIN", "TIBC", "IRON", "RETICCTPCT" in the last 72 hours. Sepsis Labs: Recent Labs  Lab 08/08/22 2333 08/09/22 0246 08/10/22 0550  PROCALCITON 0.19 0.21 2.34  LATICACIDVEN 1.8  --   --     Recent Results (from the past 240 hour(s))  Resp panel by RT-PCR (RSV, Flu A&B, Covid) Anterior Nasal Swab     Status: Abnormal   Collection Time: 08/08/22 11:33 PM   Specimen: Anterior Nasal Swab   Result Value Ref Range Status   SARS Coronavirus 2 by RT PCR NEGATIVE NEGATIVE Final    Comment: (NOTE) SARS-CoV-2 target nucleic acids are NOT DETECTED.  The SARS-CoV-2 RNA is generally detectable in upper respiratory specimens during the acute phase of infection. The lowest concentration of SARS-CoV-2 viral copies this assay can detect is 138 copies/mL. A negative result does not preclude SARS-Cov-2 infection and should not be used as the sole basis for treatment or other patient management decisions. A negative result may occur with  improper specimen collection/handling, submission of specimen other than nasopharyngeal swab, presence of viral mutation(s) within the areas targeted by this assay, and inadequate number of viral copies(<138 copies/mL). A negative result must be combined with clinical observations, patient history, and epidemiological information. The expected result is Negative.  Fact Sheet for Patients:  BloggerCourse.com  Fact Sheet for Healthcare Providers:  SeriousBroker.it  This test is no t yet approved or cleared by the [place] FDA and  has been authorized for detection and/or diagnosis of SARS-CoV-2 by FDA under an Emergency Use Authorization (EUA). This EUA will remain  in effect (meaning this test can be used) for the duration of the COVID-19 declaration under Section 564(b)(1) of the Act, 21 U.S.C.section 360bbb-3(b)(1), unless the authorization is terminated  or revoked sooner.       Influenza A by PCR POSITIVE (A) NEGATIVE Final   Influenza B by PCR NEGATIVE NEGATIVE Final    Comment: (NOTE) The Xpert Xpress SARS-CoV-2/FLU/RSV plus assay is intended as an aid in the diagnosis of influenza from Nasopharyngeal swab specimens and should not be used as a sole basis for treatment. Nasal washings and aspirates are unacceptable for Xpert Xpress SARS-CoV-2/FLU/RSV testing.  Fact Sheet for  Patients: BloggerCourse.com  Fact Sheet for Healthcare Providers: SeriousBroker.it  This test is not yet approved or cleared by the [place] FDA and has been authorized for detection and/or diagnosis of SARS-CoV-2 by FDA under an Emergency Use Authorization (EUA). This EUA will remain in effect (meaning this test can be used) for the duration of the COVID-19 declaration under Section 564(b)(1) of the Act, 21 U.S.C. section 360bbb-3(b)(1), unless the authorization is terminated or revoked.     Resp Syncytial Virus by PCR NEGATIVE NEGATIVE Final    Comment: (NOTE) Fact Sheet for Patients: BloggerCourse.com  Fact Sheet for Healthcare Providers: SeriousBroker.it  This test is not yet approved or cleared by the [place] FDA and has been authorized for detection and/or diagnosis of SARS-CoV-2 by FDA under an Emergency Use Authorization (EUA). This EUA will remain in effect (meaning this test can be used) for the duration of the COVID-19 declaration under Section 564(b)(1) of the Act, 21 U.S.C. section 360bbb-3(b)(1), unless the authorization is terminated or revoked.  Performed at [facility], [address], [place], [place] [address]   Blood Culture (  routine x 2)     Status: None (Preliminary result)   Collection Time: 08/08/22 11:33 PM   Specimen: BLOOD  Result Value Ref Range Status   Specimen Description BLOOD BLOOD LEFT FOREARM  Final   Special Requests   Final    BOTTLES DRAWN AEROBIC AND ANAEROBIC Blood Culture results may not be optimal due to an inadequate volume of blood received in culture bottles   Culture   Final    NO GROWTH 2 DAYS Performed at [facility], [address] [place], [place] [address]    Report Status PENDING  Incomplete  Blood Culture (routine x 2)     Status: None (Preliminary result)   Collection Time: 08/08/22  11:33 PM   Specimen: BLOOD  Result Value Ref Range Status   Specimen Description BLOOD BLOOD RIGHT FOREARM  Final   Special Requests   Final    BOTTLES DRAWN AEROBIC AND ANAEROBIC Blood Culture adequate volume   Culture   Final    NO GROWTH 2 DAYS Performed at [facility], [address], [place], [place] [address]    Report Status PENDING  Incomplete  Urine Culture     Status: None   Collection Time: 08/08/22 11:33 PM   Specimen: In/Out Cath Urine  Result Value Ref Range Status   Specimen Description   Final    IN/OUT CATH URINE Performed at [facility], [address] [place], [place] [address]    Special Requests   Final    NONE Performed at [facility], [address] [place], [place] [address]    Culture   Final    NO GROWTH Performed at [facility], [place] [address], [place], [place] [address]    Report Status 08/10/2022 FINAL  Final         Radiology Studies: DG Chest Port 1 View  Result Date: 08/10/2022 CLINICAL DATA:  [id number] with respiratory crackles and a history of asthma. EXAM: PORTABLE CHEST 1 VIEW COMPARISON:  CTA chest yesterday FINDINGS: 5:05 a.m.  There are emphysematous and chronic changes. Left apical postsurgical changes are again noted with possible 3 cm mass developing compared with the PET-CT of 09/25/2019. The lungs are hyperexpanded with atelectatic changes in the bases and otherwise clear. The cardiomediastinal silhouette is stable. There is aortic atherosclerosis. No pleural effusion is seen. There is a chronic fracture deformity of the mid right clavicle and advanced left shoulder DJD. IMPRESSION: 1. Left apical postsurgical changes are again noted with possible 3 cm mass developing compared with the PET-CT of 09/25/2019. 2. COPD. 3. Aortic atherosclerosis. Electronically Signed   By: Telford Nab M.D.   On: 08/10/2022 05:48   CT Angio Chest PE W and/or Wo Contrast  Result Date: 08/09/2022 CLINICAL DATA:   New onset hypoxia and tachycardia, generalized weakness EXAM: CT ANGIOGRAPHY CHEST CT ABDOMEN AND PELVIS WITH CONTRAST TECHNIQUE: Multidetector CT imaging of the chest was performed using the standard protocol during bolus administration of intravenous contrast. Multiplanar CT image reconstructions and MIPs were obtained to evaluate the vascular anatomy. Multidetector CT imaging of the abdomen and pelvis was performed using the standard protocol during bolus administration of intravenous contrast. RADIATION DOSE REDUCTION: This exam was performed according to the departmental dose-optimization program which includes automated exposure control, adjustment of the mA and/or kV according to patient size and/or use of iterative reconstruction technique. CONTRAST:  13mL OMNIPAQUE IOHEXOL 350 MG/ML SOLN COMPARISON:  Chest x-ray from the previous day, PET-CT  from 09/25/2019. FINDINGS: CTA CHEST FINDINGS Cardiovascular: Thoracic aorta demonstrates atherosclerotic calcifications. No aneurysmal dilatation or dissection is seen. No cardiac enlargement is noted. No pericardial effusion is seen. Mild coronary calcifications are noted. The pulmonary artery shows a normal branching pattern without intraluminal filling defect to suggest pulmonary embolism. Mediastinum/Nodes: Thoracic inlet is within normal limits. Esophagus is partially fluid-filled likely related to reflux. No hilar or mediastinal adenopathy is noted. Lungs/Pleura: Lungs are well aerated bilaterally. Mild emphysematous changes are seen. Basilar atelectasis is noted. In the left upper lobe there are changes consistent with prior resection with a surgical suture line superiorly. Additionally there is increasing soft tissue in the apex adjacent to the suture line which could be progressive neoplastic involvement. Musculoskeletal: No acute rib abnormality is noted. Degenerative changes of the thoracic spine are noted. Midthoracic hemangioma is seen. Review of the MIP  images confirms the above findings. CT ABDOMEN and PELVIS FINDINGS Hepatobiliary: No focal liver abnormality is seen. Status post cholecystectomy. No biliary dilatation. Pancreas: Unremarkable. No pancreatic ductal dilatation or surrounding inflammatory changes. Spleen: Normal in size without focal abnormality. Adrenals/Urinary Tract: Adrenal glands are within normal limits. Multiple simple appearing cysts are noted bilaterally stable from the prior study. No further follow-up is recommended. Atherosclerotic calcifications of the renal arteries are seen. No renal calculi or obstructive changes are noted. The bladder is partially distended. Stomach/Bowel: Diverticular change of the colon is noted without evidence of diverticulitis. No obstructive changes are seen. The appendix is partially visualized. Small bowel and stomach are within normal limits. Vascular/Lymphatic: Aortic atherosclerosis. No enlarged abdominal or pelvic lymph nodes. Reproductive: Prostate is unremarkable. Other: No abdominal wall hernia or abnormality. No abdominopelvic ascites. Musculoskeletal: No acute or significant osseous findings. Review of the MIP images confirms the above findings. IMPRESSION: CTA of the chest: No evidence of pulmonary emboli. Increasing nodularity in the left upper lobe adjacent to a suture line highly suspicious for neoplastic involvement. Further workup is recommended. CT of the abdomen and pelvis: Diverticulosis without diverticulitis. Chronic changes without acute abnormality. Electronically Signed   By: Alcide Clever M.D.   On: 08/09/2022 01:16   CT ABDOMEN PELVIS W CONTRAST  Result Date: 08/09/2022 CLINICAL DATA:  New onset hypoxia and tachycardia, generalized weakness EXAM: CT ANGIOGRAPHY CHEST CT ABDOMEN AND PELVIS WITH CONTRAST TECHNIQUE: Multidetector CT imaging of the chest was performed using the standard protocol during bolus administration of intravenous contrast. Multiplanar CT image reconstructions  and MIPs were obtained to evaluate the vascular anatomy. Multidetector CT imaging of the abdomen and pelvis was performed using the standard protocol during bolus administration of intravenous contrast. RADIATION DOSE REDUCTION: This exam was performed according to the departmental dose-optimization program which includes automated exposure control, adjustment of the mA and/or kV according to patient size and/or use of iterative reconstruction technique. CONTRAST:  OMNIPAQUE IOHEXOL 350 MG/ML SOLN COMPARISON:  Chest x-ray from the previous day, PET-CT from 09/25/2019. FINDINGS: CTA CHEST FINDINGS Cardiovascular: Thoracic aorta demonstrates atherosclerotic calcifications. No aneurysmal dilatation or dissection is seen. No cardiac enlargement is noted. No pericardial effusion is seen. Mild coronary calcifications are noted. The pulmonary artery shows a normal branching pattern without intraluminal filling defect to suggest pulmonary embolism. Mediastinum/Nodes: Thoracic inlet is within normal limits. Esophagus is partially fluid-filled likely related to reflux. No hilar or mediastinal adenopathy is noted. Lungs/Pleura: Lungs are well aerated bilaterally. Mild emphysematous changes are seen. Basilar atelectasis is noted. In the left upper lobe there are changes consistent with prior resection with a  surgical suture line superiorly. Additionally there is increasing soft tissue in the apex adjacent to the suture line which could be progressive neoplastic involvement. Musculoskeletal: No acute rib abnormality is noted. Degenerative changes of the thoracic spine are noted. Midthoracic hemangioma is seen. Review of the MIP images confirms the above findings. CT ABDOMEN and PELVIS FINDINGS Hepatobiliary: No focal liver abnormality is seen. Status post cholecystectomy. No biliary dilatation. Pancreas: Unremarkable. No pancreatic ductal dilatation or surrounding inflammatory changes. Spleen: Normal in size without focal  abnormality. Adrenals/Urinary Tract: Adrenal glands are within normal limits. Multiple simple appearing cysts are noted bilaterally stable from the prior study. No further follow-up is recommended. Atherosclerotic calcifications of the renal arteries are seen. No renal calculi or obstructive changes are noted. The bladder is partially distended. Stomach/Bowel: Diverticular change of the colon is noted without evidence of diverticulitis. No obstructive changes are seen. The appendix is partially visualized. Small bowel and stomach are within normal limits. Vascular/Lymphatic: Aortic atherosclerosis. No enlarged abdominal or pelvic lymph nodes. Reproductive: Prostate is unremarkable. Other: No abdominal wall hernia or abnormality. No abdominopelvic ascites. Musculoskeletal: No acute or significant osseous findings. Review of the MIP images confirms the above findings. IMPRESSION: CTA of the chest: No evidence of pulmonary emboli. Increasing nodularity in the left upper lobe adjacent to a suture line highly suspicious for neoplastic involvement. Further workup is recommended. CT of the abdomen and pelvis: Diverticulosis without diverticulitis. Chronic changes without acute abnormality. Electronically Signed   By: Alcide CleverMark  Lukens M.D.   On: 08/09/2022 01:16   CT HEAD WO CONTRAST (5MM)  Result Date: 08/09/2022 CLINICAL DATA:  Generalized weakness. EXAM: CT HEAD WITHOUT CONTRAST TECHNIQUE: Contiguous axial images were obtained from the base of the skull through the vertex without intravenous contrast. RADIATION DOSE REDUCTION: This exam was performed according to the departmental dose-optimization program which includes automated exposure control, adjustment of the mA and/or kV according to patient size and/or use of iterative reconstruction technique. COMPARISON:  None Available. FINDINGS: Brain: There is mild to moderate severity cerebral atrophy with widening of the extra-axial spaces and ventricular dilatation.  There are areas of decreased attenuation within the white matter tracts of the supratentorial brain, consistent with microvascular disease changes. Vascular: No hyperdense vessel or unexpected calcification. Skull: Normal. Negative for fracture or focal lesion. 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 Other: None. IMPRESSION: 1. No acute intracranial abnormality. 2. Cerebral atrophy and microvascular disease changes of the supratentorial brain. Electronically Signed   By: Aram Candelahaddeus  Houston M.D.   On: 08/09/2022 01:12   DG Chest Port 1 View  Result Date: 08/08/2022 CLINICAL DATA:  Cough and weakness EXAM: PORTABLE CHEST 1 VIEW COMPARISON:  04/30/17 FINDINGS: Cardiac shadow is within normal limits. Lungs are well aerated bilaterally. Mild interstitial changes are noted without focal infiltrate. No bony abnormality is seen. IMPRESSION: No acute abnormality noted. Electronically Signed   By: Alcide CleverMark  Lukens M.D.   On: 08/08/2022 23:47        Scheduled Meds:  aspirin  81 mg Oral Daily   atorvastatin  20 mg Oral QHS   budesonide (PULMICORT) nebulizer solution  0.5 mg Nebulization BID   docusate sodium  100 mg Oral Daily   donepezil  10 mg Oral Daily   enoxaparin (LOVENOX) injection  40 mg Subcutaneous Q24H   ferrous sulfate  325 mg Oral QPM   finasteride  5 mg Oral Daily   fluticasone  2 spray Each  Nare Daily   furosemide       gabapentin  300 mg Oral TID   hydroxyurea  500 mg Oral QHS   loratadine  10 mg Oral Daily   losartan  25 mg Oral Daily   oseltamivir  30 mg Oral BID   pantoprazole  40 mg Oral Daily   pneumococcal 20-valent conjugate vaccine  0.5 mL Intramuscular Tomorrow-1000   tamsulosin  0.4 mg Oral Daily   tiotropium  18 mcg Inhalation Daily   Vitamin D (Ergocalciferol)  50,000 Units Oral Q7 days   Continuous Infusions:     LOS: 1 day    Time spent: 62 minutes spent on chart review, discussion with nursing staff, consultants,  updating family and interview/physical exam; more than 50% of that time was spent in counseling and/or coordination of care.    Gar Glance J [place] DO [facility] Available via Epic secure chat 7am-7pm After these hours, please refer to coverage provider listed on [email or web address] 08/10/2022, 11:44 AM

## 2022-08-11 DIAGNOSIS — J9601 Acute respiratory failure with hypoxia: Secondary | ICD-10-CM | POA: Diagnosis not present

## 2022-08-11 LAB — BASIC METABOLIC PANEL
Anion gap: 12 (ref 5–15)
BUN: 28 mg/dL — ABNORMAL HIGH (ref 8–23)
CO2: 24 mmol/L (ref 22–32)
Calcium: 8.4 mg/dL — ABNORMAL LOW (ref 8.9–10.3)
Chloride: 104 mmol/L (ref 98–111)
Creatinine, Ser: 1.37 mg/dL — ABNORMAL HIGH (ref 0.61–1.24)
GFR, Estimated: 51 mL/min — ABNORMAL LOW (ref >60)
Glucose, Bld: 124 mg/dL — ABNORMAL HIGH (ref 70–99)
Potassium: 3.3 mmol/L — ABNORMAL LOW (ref 3.5–5.1)
Sodium: 140 mmol/L (ref 135–145)

## 2022-08-11 LAB — CBC
HCT: 31.2 % — ABNORMAL LOW (ref 39.0–52.0)
Hemoglobin: 10.5 g/dL — ABNORMAL LOW (ref 13.0–17.0)
MCH: 38.9 pg — ABNORMAL HIGH (ref 26.0–34.0)
MCHC: 33.7 g/dL (ref 30.0–36.0)
MCV: 115.6 fL — ABNORMAL HIGH (ref 80.0–100.0)
Platelets: 360 10*3/uL (ref 150–400)
RBC: 2.7 MIL/uL — ABNORMAL LOW (ref 4.22–5.81)
RDW: 16.5 % — ABNORMAL HIGH (ref 11.5–15.5)
WBC: 6.4 10*3/uL (ref 4.0–10.5)
nRBC: 0 % (ref 0.0–0.2)

## 2022-08-11 LAB — MAGNESIUM: Magnesium: 2 mg/dL (ref 1.7–2.4)

## 2022-08-11 LAB — PROCALCITONIN: Procalcitonin: 2.82 ng/mL

## 2022-08-11 MED ORDER — SODIUM CHLORIDE 0.9 % IV SOLN
500.0000 mg | INTRAVENOUS | Status: DC
  Administered 2022-08-11 – 2022-08-14 (×4): 500 mg via INTRAVENOUS
  Filled 2022-08-11 (×2): qty 5
  Filled 2022-08-11: qty 500
  Filled 2022-08-11: qty 5

## 2022-08-11 MED ORDER — METHYLPREDNISOLONE SODIUM SUCC 40 MG IJ SOLR
40.0000 mg | Freq: Two times a day (BID) | INTRAMUSCULAR | Status: DC
  Administered 2022-08-11 – 2022-08-14 (×7): 40 mg via INTRAVENOUS
  Filled 2022-08-11 (×7): qty 1

## 2022-08-11 MED ORDER — SODIUM CHLORIDE 0.9 % IV SOLN
2.0000 g | INTRAVENOUS | Status: DC
  Administered 2022-08-11 – 2022-08-14 (×4): 2 g via INTRAVENOUS
  Filled 2022-08-11 (×4): qty 20

## 2022-08-11 NOTE — Progress Notes (Signed)
Pt scored a Red MEWS. MD notified. No new orders at this time.

## 2022-08-11 NOTE — Progress Notes (Incomplete)
Jack Rios is a 87 YO M with a PMH of asthma/COPD, dementia, BPH, essential hypertension, dyslipidemia, and GERD. Jack Rios presented to the ED 08/08/22 presenting with acute generalized weakness and feeling sick with generalized malaise and myalgias. He also had a nonproductive cough and dyspnea since Christmas. Wife had similar symptoms. Today he presents with no signs of chest pain, palpitations, vomiting, abdominal pain, and no urinary symptoms. Transferred to ED. CC: continued unproductive cough, influenza. Labs: 98.2 F, BP: 152/90, SPO2: 92%, K: 3.3. Allergies: NKDA. PTA: needs review: albuterol HFA 108 mcg/act inhalation 2 puffs q 4 hrs PRN, fluticasone 50 mcg/act 2 sprays both nostrils BID, ipratropium 0.06% 2 sprays both nostrils q 12 hrs, mometasone 220 mcg/inh 2 puffs inhaled QD, tiotropium 18 mcg inhaled QD.  Current Medications: tiotropium 18 mcg inhalation QD, ipratropium 0.6% 2 sprays each nare BID PRN, fluticasone 50 MCG/ACT 2 sprays each nare QD, budesonide 0.5 mg nebulization BID, arformoterol 15 mcg nebulization BID, albuterol 0.083% nebulization q 4 hrs PRN.     AC/Heme: enox 40 RBC: 2.7>2.56 ID:  Influenza A/pneumonia - tamiflu x 5 days - rocephin/azithro x 5 days Neuro/Pain: CV:  Pulm:  ARF(h/o asthma/COPD) - IV solumedrol 40mg  BID - on 6L HFNC(not on O2 at b/l) Endo:   GI/Nutrition: LBM 12/31 Hepatic:  Renal:  Acute urinary retention - SCR 0.98>1.33>1.37>1.21 - foley placed 1/1 Onc:   Assessment/Plan: Patient has progressive weakness/malaise, SOB, myalgias, and nonproductive cough. Antibiotic treatment initiated  today, monitor symptoms for 5x days (01/07). Monitor for continued flu symptoms while on Tamiflu x 5 days. Watch K+, on ARB, IVF. Monitor HR. Monitor RBC.

## 2022-08-11 NOTE — Progress Notes (Signed)
PROGRESS NOTE    Jack Rios  [id number] DOB: 1936/07/04 DOA: 08/08/2022 PCP: [facility]    Brief Narrative:   Jack Rios is a 87 y.o. male with past medical history significant for asthma/COPD, dementia, BPH, essential hypertension, dyslipidemia, GERD who presented to [facility] ED on 12/30 with generalized weakness, myalgias, nonproductive cough, and shortness of breath.  Family reports initial onset Christmas Day with continued overall decline.  Family reports his wife has similar illness.  Patient denies chest pain, no palpitations, no vomiting/diarrhea, no abdominal pain, no urinary symptoms.  On EMS arrival patient's SpO2 was noted to be 89% on room air, not oxygen dependent at baseline.  Patient was transported to the ED for further evaluation  In the ED, temperature 98.0 F, HR 102, RR 24, BP 135/92, SpO2 95% on 4 L nasal cannula.  WBC 6.0, hemoglobin 10.4, platelets 353.  Sodium 140, potassium 3.6, chloride 107, CO2 25, glucose 126, BUN 20, creatinine 0.98.  AST 52, ALT 61, total bilirubin 1.2.  Lactic acid 1.8.  Procalcitonin 0.19.  Influenza A PCR positive.  RSV PCR negative.  COVID-19 PCR negative.  Urinalysis unrevealing.  CT head without contrast with no acute intracranial abnormality, noted cerebral atrophy and microvascular disease.  Chest x-ray with no acute cardiopulmonary disease process.  CT angiogram chest negative for pulmonary embolism, increasing nodularity left upper lobe suspicious for neoplastic involvement.  CT abdomen/pelvis with diverticulosis without diverticulitis, no acute abnormality.  Urine culture and blood cultures obtained.  EDP consulted [facility] for admission for further evaluation management of acute hypoxic respiratory failure secondary to influenza A viral infection.  Assessment & Plan:   Acute hypoxic respiratory failure, POA Influenza A viral infection Hx asthma/COPD Patient presenting to ED with progressive weakness/malaise, myalgias,  shortness of breath and nonproductive cough.  Patient with sick contacts, wife with similar symptoms.  Patient was noted be hypoxic with SpO2 89% on room air on EMS arrival.  Patient with Tmax 102.1.  Influenza a PCR positive.  COVID/RSV PCR negative.  No leukocytosis.  CT angiogram chest negative for PE or concern for infiltrate. -- Tamiflu 30 mg p.o. twice daily -- Budesonide neb twice daily -- Brovana neb twice daily -- Spiriva --Solu-Medrol 40 mg IV q12h -- Albuterol neb every 4 hours as needed shortness of breath/wheezing -- Nasotracheal suction as needed -- Continue supplemental oxygen, maintain SpO2 greater than 88%  Superimposed bacterial pneumonia influenza A infection -- Start azithromycin 500 mg IV every 24 hours x 5 days -- Start ceftriaxone 2 g IV every 24 hours x 5 days -- Continue supplemental oxygen, -- Repeat chest x-ray in the a.m. -- CBC daily  Acute urinary retention Patient developed acute urinary retention with greater than 700 mL noted on initial bladder scan.  Initially underwent In-N-Out catheterization with good effect.  Unfortunately patient continued to have obstruction with recurrent bladder scan showing greater than 400 mL of retained urine and Foley catheter was placed on 08/10/2022. -- Continue Foley catheter, on tamsulosin and finasteride -- Strict I's and O's   Left upper lobe nodularity Patient with history of tobacco abuse, CT angiogram with finding of increased nodularity left upper lobe suspicious for neoplastic involvement.  Outpatient follow-up with PCP for further evaluation.  Essential hypertension -- Losartan 25 mg p.o. daily -- Continue aspirin and statin  Dyslipidemia -- Atorvastatin 20 mg p.o. daily  BPH:  -- Finasteride 5 mg p.o. daily -- Tamsulosin 0.4 mg p.o. daily  GERD -- Protonix 40 mg  p.o. daily  Iron deficiency anemia: Ferrous sulfate 325 mg p.o. daily  Dementia --Delirium precautions --Get up during the day --Encourage a  familiar face to remain present throughout the day --Keep blinds open and lights on during daylight hours --Minimize the use of opioids/benzodiazepines  Weakness/debility/deconditioning: Seen by PT and OT with recommendations of SNF placement. --TOC consulted   DVT prophylaxis: enoxaparin (LOVENOX) injection 40 mg Start: 08/09/22 1000    Code Status: DNR Family Communication: Updated daughter present at bedside this morning  Disposition Plan:  Level of care: Telemetry Medical Status is: Inpatient Remains inpatient appropriate because: Will need SNF placement    Consultants:  None  Procedures:  Foley catheter 1/1  Antimicrobials:  Tamiflu 12/30>> Azithromycin 1/2>> Ceftriaxone 1/2>>   Subjective: Patient seen examined bedside.  Lying in bed, family present.  Continues with mild respiratory distress, poor oral intake.  Foley catheter placed yesterday for recurrent urinary retention.  Continues on high flow nasal cannula 12 L/min.  Discussed with daughter will initiate antibiotics to broaden coverage and add IV steroids as well.  Overall poor prognosis.  No other acute concerns currently per RN.   Objective: Vitals:   08/11/22 0634 08/11/22 0724 08/11/22 0750 08/11/22 0832  BP: (!) 112/55 111/69  (!) 109/58  Pulse: (!) 104 (!) 101  97  Resp: (!) 22 (!) 22  19  Temp: 98.6 F (37 C) (!) 97.5 F (36.4 C)  98.3 F (36.8 C)  TempSrc: Oral Oral  Oral  SpO2: 96% 97% 94% 94%  Weight:      Height:        Intake/Output Summary (Last 24 hours) at 08/11/2022 0946 Last data filed at 08/10/2022 2000 Gross per 24 hour  Intake --  Output 725 ml  Net -725 ml   Filed Weights   08/08/22 2321  Weight: 86.2 kg    Examination:  Physical Exam: GEN: alert, alert to self/time, pleasantly confused, mild respiratory distress, chronically ill/elderly in appearance HEENT: NCAT, PERRL, EOMI, sclera clear, MMM PULM: Breath sounds diminished bilateral bases with diffuse  wheezing/crackles throughout all lung fields, slightly increased respiratory effort without accessory muscle use, on 12 L HFNC CV: Tachycardic, regular rhythm w/o M/G/R GI: abd soft, NTND, NABS, no R/G/M MSK: no peripheral edema, moves all extremities independently NEURO: No focal neurological deficit identified PSYCH: Depressed mood, flat affect Integumentary: dry/intact, no rashes or wounds    Data Reviewed: I have personally reviewed following labs and imaging studies  CBC: Recent Labs  Lab 08/08/22 2333 08/09/22 0246 08/10/22 0936 08/11/22 0545  WBC 6.0 6.6 7.6 6.4  NEUTROABS 4.9  --  6.4  --   HGB 10.4* 10.5* 10.0* 10.5*  HCT 30.2* 31.2* 29.1* 31.2*  MCV 117.5* 118.2* 116.9* 115.6*  PLT 353 363 319 [id number]   Basic Metabolic Panel: Recent Labs  Lab 08/08/22 2333 08/09/22 0246 08/10/22 0936 08/11/22 0545  NA 140 138 137 140  K 3.6 3.8 3.8 3.3*  CL 107 106 104 104  CO2 25 25 23 24   GLUCOSE 126* 124* 111* 124*  BUN 20 18 24* 28*  CREATININE 0.98 0.98 1.33* 1.37*  CALCIUM 8.5* 8.1* 8.1* 8.4*  MG  --   --   --  2.0   GFR: Estimated Creatinine Clearance: 41.4 mL/min (A) (by C-G formula based on SCr of 1.37 mg/dL (H)). Liver Function Tests: Recent Labs  Lab 08/08/22 2333  AST 52*  ALT 61*  ALKPHOS 70  BILITOT 1.2  PROT 7.2  ALBUMIN  4.2   No results for input(s): "LIPASE", "AMYLASE" in the last 168 hours. No results for input(s): "AMMONIA" in the last 168 hours. Coagulation Profile: Recent Labs  Lab 08/08/22 2333  INR 1.3*   Cardiac Enzymes: No results for input(s): "CKTOTAL", "CKMB", "CKMBINDEX", "TROPONINI" in the last 168 hours. BNP (last 3 results) No results for input(s): "PROBNP" in the last 8760 hours. HbA1C: No results for input(s): "HGBA1C" in the last 72 hours. CBG: No results for input(s): "GLUCAP" in the last 168 hours. Lipid Profile: No results for input(s): "CHOL", "HDL", "LDLCALC", "TRIG", "CHOLHDL", "LDLDIRECT" in the last 72  hours. Thyroid Function Tests: No results for input(s): "TSH", "T4TOTAL", "FREET4", "T3FREE", "THYROIDAB" in the last 72 hours. Anemia Panel: No results for input(s): "VITAMINB12", "FOLATE", "FERRITIN", "TIBC", "IRON", "RETICCTPCT" in the last 72 hours. Sepsis Labs: Recent Labs  Lab 08/08/22 2333 08/09/22 0246 08/10/22 0550 08/11/22 0545  PROCALCITON 0.19 0.21 2.34 2.82  LATICACIDVEN 1.8  --   --   --     Recent Results (from the past 240 hour(s))  Resp panel by RT-PCR (RSV, Flu A&B, Covid) Anterior Nasal Swab     Status: Abnormal   Collection Time: 08/08/22 11:33 PM   Specimen: Anterior Nasal Swab  Result Value Ref Range Status   SARS Coronavirus 2 by RT PCR NEGATIVE NEGATIVE Final    Comment: (NOTE) SARS-CoV-2 target nucleic acids are NOT DETECTED.  The SARS-CoV-2 RNA is generally detectable in upper respiratory specimens during the acute phase of infection. The lowest concentration of SARS-CoV-2 viral copies this assay can detect is 138 copies/mL. A negative result does not preclude SARS-Cov-2 infection and should not be used as the sole basis for treatment or other patient management decisions. A negative result may occur with  improper specimen collection/handling, submission of specimen other than nasopharyngeal swab, presence of viral mutation(s) within the areas targeted by this assay, and inadequate number of viral copies(<138 copies/mL). A negative result must be combined with clinical observations, patient history, and epidemiological information. The expected result is Negative.  Fact Sheet for Patients:  EntrepreneurPulse.com.au  Fact Sheet for Healthcare Providers:  IncredibleEmployment.be  This test is no t yet approved or cleared by the [place] FDA and  has been authorized for detection and/or diagnosis of SARS-CoV-2 by FDA under an Emergency Use Authorization (EUA). This EUA will remain  in effect (meaning this  test can be used) for the duration of the COVID-19 declaration under Section 564(b)(1) of the Act, 21 U.S.C.section 360bbb-3(b)(1), unless the authorization is terminated  or revoked sooner.       Influenza A by PCR POSITIVE (A) NEGATIVE Final   Influenza B by PCR NEGATIVE NEGATIVE Final    Comment: (NOTE) The Xpert Xpress SARS-CoV-2/FLU/RSV plus assay is intended as an aid in the diagnosis of influenza from Nasopharyngeal swab specimens and should not be used as a sole basis for treatment. Nasal washings and aspirates are unacceptable for Xpert Xpress SARS-CoV-2/FLU/RSV testing.  Fact Sheet for Patients: EntrepreneurPulse.com.au  Fact Sheet for Healthcare Providers: IncredibleEmployment.be  This test is not yet approved or cleared by the [place] FDA and has been authorized for detection and/or diagnosis of SARS-CoV-2 by FDA under an Emergency Use Authorization (EUA). This EUA will remain in effect (meaning this test can be used) for the duration of the COVID-19 declaration under Section 564(b)(1) of the Act, 21 U.S.C. section 360bbb-3(b)(1), unless the authorization is terminated or revoked.     Resp Syncytial Virus by PCR NEGATIVE  NEGATIVE Final    Comment: (NOTE) Fact Sheet for Patients: EntrepreneurPulse.com.au  Fact Sheet for Healthcare Providers: IncredibleEmployment.be  This test is not yet approved or cleared by the [place] FDA and has been authorized for detection and/or diagnosis of SARS-CoV-2 by FDA under an Emergency Use Authorization (EUA). This EUA will remain in effect (meaning this test can be used) for the duration of the COVID-19 declaration under Section 564(b)(1) of the Act, 21 U.S.C. section 360bbb-3(b)(1), unless the authorization is terminated or revoked.  Performed at [facility], [address] [place], [place] [address]   Blood Culture (routine x 2)      Status: None (Preliminary result)   Collection Time: 08/08/22 11:33 PM   Specimen: BLOOD  Result Value Ref Range Status   Specimen Description BLOOD BLOOD LEFT FOREARM  Final   Special Requests   Final    BOTTLES DRAWN AEROBIC AND ANAEROBIC Blood Culture results may not be optimal due to an inadequate volume of blood received in culture bottles   Culture   Final    NO GROWTH 3 DAYS Performed at [facility], [address], [place], [place] [address]    Report Status PENDING  Incomplete  Blood Culture (routine x 2)     Status: None (Preliminary result)   Collection Time: 08/08/22 11:33 PM   Specimen: BLOOD  Result Value Ref Range Status   Specimen Description BLOOD BLOOD RIGHT FOREARM  Final   Special Requests   Final    BOTTLES DRAWN AEROBIC AND ANAEROBIC Blood Culture adequate volume   Culture   Final    NO GROWTH 3 DAYS Performed at [facility], [address] [place], [place] [address]    Report Status PENDING  Incomplete  Urine Culture     Status: None   Collection Time: 08/08/22 11:33 PM   Specimen: In/Out Cath Urine  Result Value Ref Range Status   Specimen Description   Final    IN/OUT CATH URINE Performed at [facility], [address] [place], [place] [address]    Special Requests   Final    NONE Performed at [facility], [address], [place], [place] [address]    Culture   Final    NO GROWTH Performed at [facility], [place] [address] [place], [place] [address]    Report Status 08/10/2022 FINAL  Final         Radiology Studies: CT ABDOMEN PELVIS WO CONTRAST  Result Date: 08/10/2022 CLINICAL DATA:  Abdominal pain. EXAM: CT ABDOMEN AND PELVIS WITHOUT CONTRAST TECHNIQUE: Multidetector CT imaging of the abdomen and pelvis was performed following the standard protocol without IV contrast. RADIATION DOSE REDUCTION: This exam was performed according to the departmental dose-optimization program which  includes automated exposure control, adjustment of the mA and/or kV according to patient size and/or use of iterative reconstruction technique. COMPARISON:  August 09, 2022 FINDINGS: Lower chest: Marked severity posterior bibasilar and lingular airspace disease is noted. This represents a new finding when compared to the prior study. Hepatobiliary: No focal liver abnormality is seen. Status post cholecystectomy. No biliary dilatation. Pancreas: Punctate parenchymal calcifications are seen scattered throughout the pancreas. There is no evidence of surrounding inflammatory fat stranding or pancreatic ductal dilatation. Spleen: Normal in size without focal abnormality. Adrenals/Urinary Tract: Adrenal glands are unremarkable. Kidneys are normal in size, without renal calculi or hydronephrosis. Multiple large, stable bilateral simple renal cysts are seen. A Foley catheter is seen within  an empty urinary bladder. Stomach/Bowel: Stomach is within normal limits. The appendix is not clearly identified. No evidence of bowel wall thickening, distention, or inflammatory changes. Noninflamed diverticula are seen throughout the sigmoid colon. Vascular/Lymphatic: Aortic atherosclerosis. No enlarged abdominal or pelvic lymph nodes. Reproductive: The prostate gland is mildly enlarged. Other: No abdominal wall hernia or abnormality. No abdominopelvic ascites. Musculoskeletal: Marked severity multilevel degenerative changes seen throughout the lumbar spine. IMPRESSION: 1. Marked severity posterior bibasilar and lingular airspace disease. 2. Evidence of prior cholecystectomy. 3. Sigmoid diverticulosis. 4. Multiple large, stable bilateral simple renal cysts. No follow-up imaging is recommended. This recommendation follows ACR consensus guidelines: Management of the Incidental Renal Mass on CT: A White Paper of the ACR Incidental Findings Committee. J Am Coll Radiol [phone]. 5. Aortic atherosclerosis. Aortic Atherosclerosis  (ICD10-I70.0). Electronically Signed   By: Virgina Norfolk M.D.   On: 08/10/2022 20:02   DG Chest Port 1 View  Result Date: 08/10/2022 CLINICAL DATA:  [id number] with respiratory crackles and a history of asthma. EXAM: PORTABLE CHEST 1 VIEW COMPARISON:  CTA chest yesterday FINDINGS: 5:05 a.m.  There are emphysematous and chronic changes. Left apical postsurgical changes are again noted with possible 3 cm mass developing compared with the PET-CT of 09/25/2019. The lungs are hyperexpanded with atelectatic changes in the bases and otherwise clear. The cardiomediastinal silhouette is stable. There is aortic atherosclerosis. No pleural effusion is seen. There is a chronic fracture deformity of the mid right clavicle and advanced left shoulder DJD. IMPRESSION: 1. Left apical postsurgical changes are again noted with possible 3 cm mass developing compared with the PET-CT of 09/25/2019. 2. COPD. 3. Aortic atherosclerosis. Electronically Signed   By: Telford Nab M.D.   On: 08/10/2022 05:48        Scheduled Meds:  arformoterol  15 mcg Nebulization BID   aspirin  81 mg Oral Daily   atorvastatin  20 mg Oral QHS   budesonide (PULMICORT) nebulizer solution  0.5 mg Nebulization BID   docusate sodium  100 mg Oral Daily   donepezil  10 mg Oral Daily   enoxaparin (LOVENOX) injection  40 mg Subcutaneous Q24H   ferrous sulfate  325 mg Oral QPM   finasteride  5 mg Oral Daily   fluticasone  2 spray Each Nare Daily   gabapentin  300 mg Oral TID   hydroxyurea  500 mg Oral QHS   loratadine  10 mg Oral Daily   losartan  25 mg Oral Daily   methylPREDNISolone (SOLU-MEDROL) injection  40 mg Intravenous Q12H   oseltamivir  30 mg Oral BID   pantoprazole  40 mg Oral Daily   pneumococcal 20-valent conjugate vaccine  0.5 mL Intramuscular Tomorrow-1000   tamsulosin  0.4 mg Oral Daily   tiotropium  18 mcg Inhalation Daily   Vitamin D (Ergocalciferol)  50,000 Units Oral Q7 days   Continuous Infusions:  azithromycin      cefTRIAXone (ROCEPHIN)  IV        LOS: 2 days    Time spent: 51 minutes spent on chart review, discussion with nursing staff, consultants, updating family and interview/physical exam; more than 50% of that time was spent in counseling and/or coordination of care.    Zacharius Funari J [place] DO [facility] Available via Epic secure chat 7am-7pm After these hours, please refer to coverage provider listed on [email or web address] 08/11/2022, 9:46 AM

## 2022-08-11 NOTE — Progress Notes (Signed)
PT Cancellation Note  Patient Details Name: Jack Rios MRN: [id number] DOB: 1936/02/04   Cancelled Treatment:    Reason Eval/Treat Not Completed: Medical issues which prohibited therapy;Patient not medically ready (Per chart review, RR called last night, pt now on 10L/min flowrate to meet saturation goals. No new MD notes in. Will defer PT services until additional updates are available.)  8:59 AM, 08/11/22 Etta Grandchild, PT, DPT Physical Therapist - [facility]  [phone] ([place])    Oyuki Hogan C 08/11/2022, 8:59 AM

## 2022-08-12 ENCOUNTER — Inpatient Hospital Stay: Payer: No Typology Code available for payment source

## 2022-08-12 DIAGNOSIS — J9601 Acute respiratory failure with hypoxia: Secondary | ICD-10-CM | POA: Diagnosis not present

## 2022-08-12 LAB — CBC
HCT: 29.6 % — ABNORMAL LOW (ref 39.0–52.0)
Hemoglobin: 10.1 g/dL — ABNORMAL LOW (ref 13.0–17.0)
MCH: 39.5 pg — ABNORMAL HIGH (ref 26.0–34.0)
MCHC: 34.1 g/dL (ref 30.0–36.0)
MCV: 115.6 fL — ABNORMAL HIGH (ref 80.0–100.0)
Platelets: 344 10*3/uL (ref 150–400)
RBC: 2.56 MIL/uL — ABNORMAL LOW (ref 4.22–5.81)
RDW: 16 % — ABNORMAL HIGH (ref 11.5–15.5)
WBC: 5.9 10*3/uL (ref 4.0–10.5)
nRBC: 0 % (ref 0.0–0.2)

## 2022-08-12 LAB — BASIC METABOLIC PANEL
Anion gap: 9 (ref 5–15)
BUN: 36 mg/dL — ABNORMAL HIGH (ref 8–23)
CO2: 26 mmol/L (ref 22–32)
Calcium: 8.6 mg/dL — ABNORMAL LOW (ref 8.9–10.3)
Chloride: 107 mmol/L (ref 98–111)
Creatinine, Ser: 1.21 mg/dL (ref 0.61–1.24)
GFR, Estimated: 59 mL/min — ABNORMAL LOW (ref >60)
Glucose, Bld: 189 mg/dL — ABNORMAL HIGH (ref 70–99)
Potassium: 3.5 mmol/L (ref 3.5–5.1)
Sodium: 142 mmol/L (ref 135–145)

## 2022-08-12 LAB — PROCALCITONIN: Procalcitonin: 2.9 ng/mL

## 2022-08-12 LAB — MAGNESIUM: Magnesium: 2.3 mg/dL (ref 1.7–2.4)

## 2022-08-12 LAB — VITAMIN B12: Vitamin B-12: 641 pg/mL (ref 180–914)

## 2022-08-12 LAB — BRAIN NATRIURETIC PEPTIDE: B Natriuretic Peptide: 86.4 pg/mL (ref 0.0–100.0)

## 2022-08-12 LAB — TSH: TSH: 0.411 u[IU]/mL (ref 0.350–4.500)

## 2022-08-12 MED ORDER — GUAIFENESIN 100 MG/5ML PO LIQD: 5.0000 mL | ORAL | Status: DC | PRN

## 2022-08-12 MED ORDER — CHLORHEXIDINE GLUCONATE CLOTH 2 % EX PADS
6.0000 | MEDICATED_PAD | Freq: Every day | CUTANEOUS | Status: DC
  Administered 2022-08-12: 6 via TOPICAL

## 2022-08-12 MED ORDER — TRAZODONE HCL 50 MG PO TABS
50.0000 mg | ORAL_TABLET | Freq: Every evening | ORAL | Status: DC | PRN
  Administered 2022-08-12: 50 mg via ORAL
  Filled 2022-08-12: qty 1

## 2022-08-12 MED ORDER — GUAIFENESIN ER 600 MG PO TB12
600.0000 mg | ORAL_TABLET | Freq: Two times a day (BID) | ORAL | Status: DC
  Administered 2022-08-12 – 2022-08-14 (×5): 600 mg via ORAL
  Filled 2022-08-12 (×5): qty 1

## 2022-08-12 MED ORDER — IPRATROPIUM-ALBUTEROL 0.5-2.5 (3) MG/3ML IN SOLN: 3.0000 mL | RESPIRATORY_TRACT | Status: DC | PRN

## 2022-08-12 MED ORDER — METOPROLOL TARTRATE 5 MG/5ML IV SOLN: 5.0000 mg | INTRAVENOUS | Status: DC | PRN

## 2022-08-12 NOTE — Progress Notes (Signed)
PROGRESS NOTE    Jack Rios  GYF:[id number] DOB: 08/05/1936 DOA: 08/08/2022 PCP: [facility]   Brief Narrative:  87 y.o. male with past medical history significant for asthma/COPD, dementia, BPH, essential hypertension, dyslipidemia, GERD who presented to [facility] ED on 12/30 with generalized weakness, myalgias, nonproductive cough, and shortness of breath. Influenza A PCR positive. RSV PCR negative. COVID-19 PCR negative. Urinalysis unrevealing. CT head without contrast with no acute intracranial abnormality, noted cerebral atrophy and microvascular disease. Chest x-ray with no acute cardiopulmonary disease process. CT angiogram chest negative for pulmonary embolism, increasing nodularity left upper lobe suspicious for neoplastic involvement. CT abdomen/pelvis with diverticulosis without diverticulitis, no acute abnormality. Urine culture and blood cultures obtained. EDP consulted [facility] for admission for further evaluation management of acute hypoxic respiratory failure secondary to influenza A viral infection.    Assessment & Plan:  Principal Problem:   Acute respiratory failure with hypoxia (HCC) Active Problems:   Essential hypertension   Influenza A   Generalized weakness   Dyslipidemia   Asthma, chronic   BPH (benign prostatic hyperplasia)   GERD without esophagitis   Dementia without behavioral disturbance (HCC)   cute hypoxic respiratory failure, POA Influenza A viral infection Hx asthma/COPD Patient presenting to ED with progressive weakness/malaise, myalgias, shortness of breath and nonproductive cough.  Patient with sick contacts, wife with similar symptoms.  Patient was noted be hypoxic with SpO2 89% on room air on EMS arrival.  Patient with Tmax 102.1.  Influenza a PCR positive.  COVID/RSV PCR negative.  No leukocytosis.  CT angiogram chest negative for PE or concern for infiltrate.  Procalcitonin elevated, check BNP -- Tamiflu 30 mg p.o. twice daily; last day 1/3 --  Bronchodilators scheduled and as needed --Solu-Medrol 40 mg IV q12h -- Supplemental oxygen   Superimposed bacterial pneumonia influenza A infection -- Rocephin and azithromycin until 1/7 -- Continue supplemental oxygen, -- Repeat CXR 1/3= persistent atelactasis   Acute urinary retention Continue Flomax and finasteride.  Foley catheter in place.   Left upper lobe nodularity Patient with history of tobacco abuse, CT angiogram with finding of increased nodularity left upper lobe suspicious for neoplastic involvement.  Outpatient follow-up with PCP for further evaluation.   Essential hypertension -- Losartan 25 mg p.o. daily -- Continue aspirin and statin   Dyslipidemia -- Atorvastatin 20 mg p.o. daily   BPH:  -- Finasteride 5 mg p.o. daily -- Tamsulosin 0.4 mg p.o. daily   GERD -- Protonix 40 mg p.o. daily   Iron deficiency anemia Macrocytosis Ferrous sulfate 325 mg p.o. daily TSH and B12 normal   Dementia -- Supportive care   Weakness/debility/deconditioning: Seen by PT and OT with recommendations of SNF placement. --TOC consulted       DVT prophylaxis: Lovenox Code Status: DNR Family Communication: Daughter at bedside  Status is: Inpatient Ongoing management for pneumonia.  Eventually likely will need SNF placement.  For now he needs aggressive breathing treatment  Procedures:  Foley catheter 1/1   Antimicrobials:  Tamiflu 12/30>> Azithromycin 1/2>> Ceftriaxone 1/2>>  Subjective: Still having quite a bit of cough and congestion.  Finally out of bed today sitting up in the chair.   Examination:  General exam: Appears calm and comfortable, 3 L nasal cannula Respiratory system: Bilateral rhonchi Cardiovascular system: S1 & S2 heard, RRR. No JVD, murmurs, rubs, gallops or clicks. No pedal edema. Gastrointestinal system: Abdomen is nondistended, soft and nontender. No organomegaly or masses felt. Normal bowel sounds heard. Central nervous system: Alert  and oriented. No focal neurological deficits. Extremities: Symmetric 5 x 5 power. Skin: No rashes, lesions or ulcers Psychiatry: Judgement and insight appear normal. Mood & affect appropriate.     Objective: Vitals:   08/11/22 1951 08/11/22 1958 08/11/22 2323 08/12/22 0606  BP: (!) 101/55  115/77 99/79  Pulse: 88  85 72  Resp: 20  19 16   Temp: 98.3 F (36.8 C)  98.1 F (36.7 C) (!) 97.2 F (36.2 C)  TempSrc:      SpO2: 96% 95% 93% 97%  Weight:      Height:        Intake/Output Summary (Last 24 hours) at 08/12/2022 0742 Last data filed at 08/12/2022 0610 Gross per 24 hour  Intake 350 ml  Output 1250 ml  Net -900 ml   Filed Weights   08/08/22 2321  Weight: 86.2 kg     Data Reviewed:   CBC: Recent Labs  Lab 08/08/22 2333 08/09/22 0246 08/10/22 0936 08/11/22 0545 08/12/22 0553  WBC 6.0 6.6 7.6 6.4 5.9  NEUTROABS 4.9  --  6.4  --   --   HGB 10.4* 10.5* 10.0* 10.5* 10.1*  HCT 30.2* 31.2* 29.1* 31.2* 29.6*  MCV 117.5* 118.2* 116.9* 115.6* 115.6*  PLT 353 363 319 360 960   Basic Metabolic Panel: Recent Labs  Lab 08/08/22 2333 08/09/22 0246 08/10/22 0936 08/11/22 0545 08/12/22 0553  NA 140 138 137 140 142  K 3.6 3.8 3.8 3.3* 3.5  CL 107 106 104 104 107  CO2 25 25 23 24 26   GLUCOSE 126* 124* 111* 124* 189*  BUN 20 18 24* 28* 36*  CREATININE 0.98 0.98 1.33* 1.37* 1.21  CALCIUM 8.5* 8.1* 8.1* 8.4* 8.6*  MG  --   --   --  2.0 2.3   GFR: Estimated Creatinine Clearance: 46.9 mL/min (by C-G formula based on SCr of 1.21 mg/dL). Liver Function Tests: Recent Labs  Lab 08/08/22 2333  AST 52*  ALT 61*  ALKPHOS 70  BILITOT 1.2  PROT 7.2  ALBUMIN 4.2   No results for input(s): "LIPASE", "AMYLASE" in the last 168 hours. No results for input(s): "AMMONIA" in the last 168 hours. Coagulation Profile: Recent Labs  Lab 08/08/22 2333  INR 1.3*   Cardiac Enzymes: No results for input(s): "CKTOTAL", "CKMB", "CKMBINDEX", "TROPONINI" in the last 168 hours. BNP  (last 3 results) No results for input(s): "PROBNP" in the last 8760 hours. HbA1C: No results for input(s): "HGBA1C" in the last 72 hours. CBG: No results for input(s): "GLUCAP" in the last 168 hours. Lipid Profile: No results for input(s): "CHOL", "HDL", "LDLCALC", "TRIG", "CHOLHDL", "LDLDIRECT" in the last 72 hours. Thyroid Function Tests: No results for input(s): "TSH", "T4TOTAL", "FREET4", "T3FREE", "THYROIDAB" in the last 72 hours. Anemia Panel: No results for input(s): "VITAMINB12", "FOLATE", "FERRITIN", "TIBC", "IRON", "RETICCTPCT" in the last 72 hours. Sepsis Labs: Recent Labs  Lab 08/08/22 2333 08/09/22 0246 08/10/22 0550 08/11/22 0545 08/12/22 0553  PROCALCITON 0.19 0.21 2.34 2.82 2.90  LATICACIDVEN 1.8  --   --   --   --     Recent Results (from the past 240 hour(s))  Resp panel by RT-PCR (RSV, Flu A&B, Covid) Anterior Nasal Swab     Status: Abnormal   Collection Time: 08/08/22 11:33 PM   Specimen: Anterior Nasal Swab  Result Value Ref Range Status   SARS Coronavirus 2 by RT PCR NEGATIVE NEGATIVE Final    Comment: (NOTE) SARS-CoV-2 target nucleic acids are NOT DETECTED.  The  SARS-CoV-2 RNA is generally detectable in upper respiratory specimens during the acute phase of infection. The lowest concentration of SARS-CoV-2 viral copies this assay can detect is 138 copies/mL. A negative result does not preclude SARS-Cov-2 infection and should not be used as the sole basis for treatment or other patient management decisions. A negative result may occur with  improper specimen collection/handling, submission of specimen other than nasopharyngeal swab, presence of viral mutation(s) within the areas targeted by this assay, and inadequate number of viral copies(<138 copies/mL). A negative result must be combined with clinical observations, patient history, and epidemiological information. The expected result is Negative.  Fact Sheet for Patients:   BloggerCourse.com  Fact Sheet for Healthcare Providers:  SeriousBroker.it  This test is no t yet approved or cleared by the [place] FDA and  has been authorized for detection and/or diagnosis of SARS-CoV-2 by FDA under an Emergency Use Authorization (EUA). This EUA will remain  in effect (meaning this test can be used) for the duration of the COVID-19 declaration under Section 564(b)(1) of the Act, 21 U.S.C.section 360bbb-3(b)(1), unless the authorization is terminated  or revoked sooner.       Influenza A by PCR POSITIVE (A) NEGATIVE Final   Influenza B by PCR NEGATIVE NEGATIVE Final    Comment: (NOTE) The Xpert Xpress SARS-CoV-2/FLU/RSV plus assay is intended as an aid in the diagnosis of influenza from Nasopharyngeal swab specimens and should not be used as a sole basis for treatment. Nasal washings and aspirates are unacceptable for Xpert Xpress SARS-CoV-2/FLU/RSV testing.  Fact Sheet for Patients: BloggerCourse.com  Fact Sheet for Healthcare Providers: SeriousBroker.it  This test is not yet approved or cleared by the [place] FDA and has been authorized for detection and/or diagnosis of SARS-CoV-2 by FDA under an Emergency Use Authorization (EUA). This EUA will remain in effect (meaning this test can be used) for the duration of the COVID-19 declaration under Section 564(b)(1) of the Act, 21 U.S.C. section 360bbb-3(b)(1), unless the authorization is terminated or revoked.     Resp Syncytial Virus by PCR NEGATIVE NEGATIVE Final    Comment: (NOTE) Fact Sheet for Patients: BloggerCourse.com  Fact Sheet for Healthcare Providers: SeriousBroker.it  This test is not yet approved or cleared by the [place] FDA and has been authorized for detection and/or diagnosis of SARS-CoV-2 by FDA under an Emergency Use  Authorization (EUA). This EUA will remain in effect (meaning this test can be used) for the duration of the COVID-19 declaration under Section 564(b)(1) of the Act, 21 U.S.C. section 360bbb-3(b)(1), unless the authorization is terminated or revoked.  Performed at [facility], [address] [place], [place] [address]   Blood Culture (routine x 2)     Status: None (Preliminary result)   Collection Time: 08/08/22 11:33 PM   Specimen: BLOOD  Result Value Ref Range Status   Specimen Description BLOOD BLOOD LEFT FOREARM  Final   Special Requests   Final    BOTTLES DRAWN AEROBIC AND ANAEROBIC Blood Culture results may not be optimal due to an inadequate volume of blood received in culture bottles   Culture   Final    NO GROWTH 3 DAYS Performed at [facility], [address] [place], [place] [address]    Report Status PENDING  Incomplete  Blood Culture (routine x 2)     Status: None (Preliminary result)   Collection Time: 08/08/22 11:33 PM   Specimen: BLOOD  Result Value Ref Range Status   Specimen Description  BLOOD BLOOD RIGHT FOREARM  Final   Special Requests   Final    BOTTLES DRAWN AEROBIC AND ANAEROBIC Blood Culture adequate volume   Culture   Final    NO GROWTH 3 DAYS Performed at [facility], [address] [place], [place] [address]    Report Status PENDING  Incomplete  Urine Culture     Status: None   Collection Time: 08/08/22 11:33 PM   Specimen: In/Out Cath Urine  Result Value Ref Range Status   Specimen Description   Final    IN/OUT CATH URINE Performed at [facility], [address] [place], [place] [address]    Special Requests   Final    NONE Performed at [facility], [address] [place], [place] [address]    Culture   Final    NO GROWTH Performed at [facility], [address], [place], [place] [address]    Report Status 08/10/2022 FINAL  Final         Radiology Studies: CT ABDOMEN  PELVIS WO CONTRAST  Result Date: 08/10/2022 CLINICAL DATA:  Abdominal pain. EXAM: CT ABDOMEN AND PELVIS WITHOUT CONTRAST TECHNIQUE: Multidetector CT imaging of the abdomen and pelvis was performed following the standard protocol without IV contrast. RADIATION DOSE REDUCTION: This exam was performed according to the departmental dose-optimization program which includes automated exposure control, adjustment of the mA and/or kV according to patient size and/or use of iterative reconstruction technique. COMPARISON:  August 09, 2022 FINDINGS: Lower chest: Marked severity posterior bibasilar and lingular airspace disease is noted. This represents a new finding when compared to the prior study. Hepatobiliary: No focal liver abnormality is seen. Status post cholecystectomy. No biliary dilatation. Pancreas: Punctate parenchymal calcifications are seen scattered throughout the pancreas. There is no evidence of surrounding inflammatory fat stranding or pancreatic ductal dilatation. Spleen: Normal in size without focal abnormality. Adrenals/Urinary Tract: Adrenal glands are unremarkable. Kidneys are normal in size, without renal calculi or hydronephrosis. Multiple large, stable bilateral simple renal cysts are seen. A Foley catheter is seen within an empty urinary bladder. Stomach/Bowel: Stomach is within normal limits. The appendix is not clearly identified. No evidence of bowel wall thickening, distention, or inflammatory changes. Noninflamed diverticula are seen throughout the sigmoid colon. Vascular/Lymphatic: Aortic atherosclerosis. No enlarged abdominal or pelvic lymph nodes. Reproductive: The prostate gland is mildly enlarged. Other: No abdominal wall hernia or abnormality. No abdominopelvic ascites. Musculoskeletal: Marked severity multilevel degenerative changes seen throughout the lumbar spine. IMPRESSION: 1. Marked severity posterior bibasilar and lingular airspace disease. 2. Evidence of prior cholecystectomy.  3. Sigmoid diverticulosis. 4. Multiple large, stable bilateral simple renal cysts. No follow-up imaging is recommended. This recommendation follows ACR consensus guidelines: Management of the Incidental Renal Mass on CT: A White Paper of the ACR Incidental Findings Committee. J Am Coll Radiol [id number]. 5. Aortic atherosclerosis. Aortic Atherosclerosis (ICD10-I70.0). Electronically Signed   By: Aram Candela M.D.   On: 08/10/2022 20:02        Scheduled Meds:  arformoterol  15 mcg Nebulization BID   aspirin  81 mg Oral Daily   atorvastatin  20 mg Oral QHS   budesonide (PULMICORT) nebulizer solution  0.5 mg Nebulization BID   docusate sodium  100 mg Oral Daily   donepezil  10 mg Oral Daily   enoxaparin (LOVENOX) injection  40 mg Subcutaneous Q24H   ferrous sulfate  325 mg Oral QPM   finasteride  5 mg Oral Daily   fluticasone  2  spray Each Nare Daily   gabapentin  300 mg Oral TID   hydroxyurea  500 mg Oral QHS   loratadine  10 mg Oral Daily   losartan  25 mg Oral Daily   methylPREDNISolone (SOLU-MEDROL) injection  40 mg Intravenous Q12H   oseltamivir  30 mg Oral BID   pantoprazole  40 mg Oral Daily   pneumococcal 20-valent conjugate vaccine  0.5 mL Intramuscular Tomorrow-1000   tamsulosin  0.4 mg Oral Daily   tiotropium  18 mcg Inhalation Daily   Vitamin D (Ergocalciferol)  50,000 Units Oral Q7 days   Continuous Infusions:  azithromycin Stopped (08/11/22 1233)   cefTRIAXone (ROCEPHIN)  IV Stopped (08/11/22 1125)     LOS: 3 days   Time spent= 35 mins    Shyheem Whitham Joline Maxcy, MD [facility]  If 7PM-7AM, please contact night-coverage  08/12/2022, 7:42 AM

## 2022-08-12 NOTE — Progress Notes (Addendum)
CSW spoke with pt's dtr Mateo Flow, to discuss the recs for d/c planning.  The rec is for SNF, however, she is declining and wants pt to go home with [facility].  CSW spoke with Verda Cumins.  They will provide [facility] support.  CSW asked for orders to be put in as soon as possible, as Malachy Mood has to get auth from [place] for services and it could take a day or so.

## 2022-08-12 NOTE — Progress Notes (Signed)
Occupational Therapy Treatment Patient Details Name: Jack Rios MRN: [id number] DOB: 27-Aug-1935 Today's Date: 08/12/2022   History of present illness Pt is an 87 year old patient admitted with influenza A after presenting with feeling sick with generalized malaise and myalgias; PMH significant for asthma, dementia, BPH, GERD, essential hypertension and dyslipidemia   OT comments  Chart reviewed, pt greeted in room with daughter present. Co tx with PT completed during mobility on this date. Pt is alert, oriented to self, place, not oriented to situation. Increased time for processing required throughout. Tx session targeted improving activity tolerance in order to facilitate improved indep ADL task completion. Improvements noted in bed mobility with supervision with use of bed features, STS with MIN A, short amb transfer to beside chair with CGA. Pt has poor activity tolerance for further mobility efforts. Grooming tasks completed in sitting with MIN A. Pt daughter reports they want to bring pt home vs rehab. Would recommend mwc, bsc, shower chair, consider hospital bed, RW and frequent/constant supervision and physical assist for ADL if pt returns home. OT will continue to follow acutely.    Recommendations for follow up therapy are one component of a multi-disciplinary discharge planning process, led by the attending physician.  Recommendations may be updated based on patient status, additional functional criteria and insurance authorization.    Follow Up Recommendations  Skilled nursing-short term rehab (<3 hours/day)     Assistance Recommended at Discharge Frequent or constant Supervision/Assistance  Patient can return home with the following  A lot of help with walking and/or transfers;A lot of help with bathing/dressing/bathroom   Equipment Recommendations  BSC/3in1;Tub/shower seat;Wheelchair (measurements OT);Wheelchair cushion (measurements OT);Hospital bed, 2WW   Recommendations  for Other Services      Precautions / Restrictions Precautions Precautions: Fall Restrictions Weight Bearing Restrictions: No       Mobility Bed Mobility Overal bed mobility: Needs Assistance Bed Mobility: Supine to Sit     Supine to sit: Supervision (significantly increased time)     General bed mobility comments: frequent vcs    Transfers Overall transfer level: Needs assistance Equipment used: Rolling walker (2 wheels) Transfers: Sit to/from Stand Sit to Stand: Min assist           General transfer comment: from chair     Balance Overall balance assessment: Needs assistance Sitting-balance support: Feet supported Sitting balance-Leahy Scale: Good     Standing balance support: Bilateral upper extremity supported, Reliant on assistive device for balance Standing balance-Leahy Scale: Poor                             ADL either performed or assessed with clinical judgement   ADL Overall ADL's : Needs assistance/impaired     Grooming: Oral care;Wash/dry face;Minimal assistance;Cueing for sequencing Grooming Details (indicate cue type and reason): tremors noted throughout             Lower Body Dressing: Maximal assistance   Toilet Transfer: Min guard;Minimal assistance;Rolling walker (2 wheels)                  Extremity/Trunk Assessment              Vision       Perception     Praxis      Cognition Arousal/Alertness: Lethargic Behavior During Therapy: Flat affect Overall Cognitive Status: Impaired/Different from baseline Area of Impairment: Orientation, Attention, Memory, Following commands, Safety/judgement, Awareness, Problem solving  Orientation Level: Disoriented to, Time Current Attention Level: Focused Memory: Decreased short-term memory Following Commands: Follows one step commands with increased time Safety/Judgement: Decreased awareness of deficits Awareness: Intellectual Problem  Solving: Slow processing, Requires verbal cues, Requires tactile cues          Exercises Other Exercises Other Exercises: edu daughter re: recommendations, assistance for ADL at home, DME use for safe ADL/MRADL completion    Shoulder Instructions       General Comments spo2 >90% throughout on 6 L via HFNC    Pertinent Vitals/ Pain       Pain Assessment Pain Assessment: No/denies pain  Home Living                                          Prior Functioning/Environment              Frequency  Min 2X/week        Progress Toward Goals  OT Goals(current goals can now be found in the care plan section)  Progress towards OT goals: Progressing toward goals     Plan Discharge plan remains appropriate    Co-evaluation    PT/OT/SLP Co-Evaluation/Treatment: Yes Reason for Co-Treatment: To address functional/ADL transfers;For patient/therapist safety   OT goals addressed during session: ADL's and self-care      AM-PAC OT "6 Clicks" Daily Activity     Outcome Measure   Help from another person eating meals?: A Little Help from another person taking care of personal grooming?: A Little Help from another person toileting, which includes using toliet, bedpan, or urinal?: A Lot Help from another person bathing (including washing, rinsing, drying)?: A Lot Help from another person to put on and taking off regular upper body clothing?: A Little Help from another person to put on and taking off regular lower body clothing?: A Lot 6 Click Score: 15    End of Session Equipment Utilized During Treatment: Oxygen;Rolling walker (2 wheels);Gait belt  OT Visit Diagnosis: Unsteadiness on feet (R26.81);Muscle weakness (generalized) (M62.81)   Activity Tolerance Patient tolerated treatment well   Patient Left in chair;with call bell/phone within reach;with family/visitor present   Nurse Communication Mobility status        Time: 3151-7616 OT Time  Calculation (min): 25 min  Charges: OT General Charges $OT Visit: 1 Visit OT Treatments $Self Care/Home Management : 8-22 mins  Shanon Payor, OTD OTR/L  08/12/22, 10:52 AM

## 2022-08-12 NOTE — Progress Notes (Signed)
Physical Therapy Treatment Patient Details Name: Jack Rios MRN: [id number] DOB: 18-Feb-1936 Today's Date: 08/12/2022   History of Present Illness Pt is an 87 year old patient admitted with influenza A after presenting with feeling sick with generalized malaise and myalgias; PMH significant for asthma, dementia, BPH, GERD, essential hypertension and dyslipidemia    PT Comments    Patient received in bed, daughters at bedside and they are planning to take patient home. He required increased time and effort for bed mobility, but no true physical assist beyond cues and use of bed rails. He needs min A for sit to stand and for ambulation from bed to recliner using RW. Patient is at increased fall risk and will continue to benefit from skilled PT to improve strength and functional independence.    Recommendations for follow up therapy are one component of a multi-disciplinary discharge planning process, led by the attending physician.  Recommendations may be updated based on patient status, additional functional criteria and insurance authorization.  Follow Up Recommendations  Skilled nursing-short term rehab (<3 hours/day) Can patient physically be transported by private vehicle: No   Assistance Recommended at Discharge Frequent or constant Supervision/Assistance  Patient can return home with the following A lot of help with walking and/or transfers;Assist for transportation;Help with stairs or ramp for entrance;A lot of help with bathing/dressing/bathroom   Equipment Recommendations  Rolling walker (2 wheels);BSC/3in1;Wheelchair (measurements PT);Wheelchair cushion (measurements PT)    Recommendations for Other Services       Precautions / Restrictions Precautions Precautions: Fall Restrictions Weight Bearing Restrictions: No     Mobility  Bed Mobility Overal bed mobility: Needs Assistance Bed Mobility: Supine to Sit     Supine to sit: Supervision, HOB elevated     General  bed mobility comments: increased time, effort, cues to perform    Transfers Overall transfer level: Needs assistance Equipment used: Rolling walker (2 wheels) Transfers: Sit to/from Stand Sit to Stand: Min assist           General transfer comment: from bed and chair, cues for safety/hand placement    Ambulation/Gait Ambulation/Gait assistance: Min assist Gait Distance (Feet): 3 Feet Assistive device: Rolling walker (2 wheels) Gait Pattern/deviations: Step-to pattern, Decreased step length - right, Decreased step length - left, Decreased stride length, Shuffle Gait velocity: decr     General Gait Details: short, shuffle steps using RW and min A. Cues for safety   Stairs             Wheelchair Mobility    Modified Rankin (Stroke Patients Only)       Balance Overall balance assessment: Needs assistance Sitting-balance support: Feet supported Sitting balance-Leahy Scale: Good     Standing balance support: Bilateral upper extremity supported, During functional activity, Reliant on assistive device for balance Standing balance-Leahy Scale: Poor Standing balance comment: initially patient with posterior leaning in standing. Improved to min A                            Cognition Arousal/Alertness: Lethargic Behavior During Therapy: Flat affect Overall Cognitive Status: Impaired/Different from baseline Area of Impairment: Orientation, Attention, Memory, Following commands, Safety/judgement, Awareness, Problem solving                 Orientation Level: Disoriented to, Time Current Attention Level: Focused Memory: Decreased short-term memory Following Commands: Follows one step commands with increased time Safety/Judgement: Decreased awareness of deficits Awareness: Intellectual Problem Solving: Slow processing,  Requires verbal cues, Requires tactile cues          Exercises      General Comments General comments (skin integrity, edema,  etc.): spo2 >90% throughout on 6 L via HFNC      Pertinent Vitals/Pain Pain Assessment Pain Assessment: No/denies pain    Home Living                          Prior Function            PT Goals (current goals can now be found in the care plan section) Acute Rehab PT Goals Patient Stated Goal: return to baseline PT Goal Formulation: With family Time For Goal Achievement: 08/19/22 Potential to Achieve Goals: Fair Progress towards PT goals: Progressing toward goals    Frequency    Min 2X/week      PT Plan Current plan remains appropriate    Co-evaluation PT/OT/SLP Co-Evaluation/Treatment: Yes Reason for Co-Treatment: To address functional/ADL transfers;For patient/therapist safety PT goals addressed during session: Mobility/safety with mobility;Balance;Proper use of DME OT goals addressed during session: ADL's and self-care      AM-PAC PT "6 Clicks" Mobility   Outcome Measure  Help needed turning from your back to your side while in a flat bed without using bedrails?: A Little Help needed moving from lying on your back to sitting on the side of a flat bed without using bedrails?: A Lot Help needed moving to and from a bed to a chair (including a wheelchair)?: A Little Help needed standing up from a chair using your arms (e.g., wheelchair or bedside chair)?: A Lot Help needed to walk in hospital room?: A Lot Help needed climbing 3-5 steps with a railing? : Total 6 Click Score: 13    End of Session Equipment Utilized During Treatment: Oxygen;Gait belt Activity Tolerance: Patient limited by fatigue;Patient limited by lethargy Patient left: in chair;with call bell/phone within reach;with chair alarm set Nurse Communication: Mobility status PT Visit Diagnosis: Difficulty in walking, not elsewhere classified (R26.2);Other abnormalities of gait and mobility (R26.89);Muscle weakness (generalized) (M62.81)     Time: 1478-2956 PT Time Calculation (min) (ACUTE  ONLY): 26 min  Charges:  $Therapeutic Activity: 8-22 mins                     [employer], PT, [employer] 08/12/22,12:24 PM

## 2022-08-13 DIAGNOSIS — J1008 Influenza due to other identified influenza virus with other specified pneumonia: Secondary | ICD-10-CM | POA: Diagnosis not present

## 2022-08-13 DIAGNOSIS — J9601 Acute respiratory failure with hypoxia: Secondary | ICD-10-CM | POA: Diagnosis not present

## 2022-08-13 LAB — CULTURE, BLOOD (ROUTINE X 2)
Culture: NO GROWTH
Culture: NO GROWTH
Special Requests: ADEQUATE

## 2022-08-13 LAB — CBC
HCT: 27.8 % — ABNORMAL LOW (ref 39.0–52.0)
Hemoglobin: 9.5 g/dL — ABNORMAL LOW (ref 13.0–17.0)
MCH: 38.9 pg — ABNORMAL HIGH (ref 26.0–34.0)
MCHC: 34.2 g/dL (ref 30.0–36.0)
MCV: 113.9 fL — ABNORMAL HIGH (ref 80.0–100.0)
Platelets: 414 10*3/uL — ABNORMAL HIGH (ref 150–400)
RBC: 2.44 MIL/uL — ABNORMAL LOW (ref 4.22–5.81)
RDW: 15.5 % (ref 11.5–15.5)
WBC: 9.2 10*3/uL (ref 4.0–10.5)
nRBC: 0.3 % — ABNORMAL HIGH (ref 0.0–0.2)

## 2022-08-13 LAB — BASIC METABOLIC PANEL
Anion gap: 11 (ref 5–15)
BUN: 45 mg/dL — ABNORMAL HIGH (ref 8–23)
CO2: 24 mmol/L (ref 22–32)
Calcium: 8.6 mg/dL — ABNORMAL LOW (ref 8.9–10.3)
Chloride: 105 mmol/L (ref 98–111)
Creatinine, Ser: 1.13 mg/dL (ref 0.61–1.24)
GFR, Estimated: >60 mL/min (ref >60)
Glucose, Bld: 167 mg/dL — ABNORMAL HIGH (ref 70–99)
Potassium: 3.3 mmol/L — ABNORMAL LOW (ref 3.5–5.1)
Sodium: 140 mmol/L (ref 135–145)

## 2022-08-13 LAB — MAGNESIUM: Magnesium: 2.3 mg/dL (ref 1.7–2.4)

## 2022-08-13 MED ORDER — POTASSIUM CHLORIDE CRYS ER 20 MEQ PO TBCR
40.0000 meq | EXTENDED_RELEASE_TABLET | Freq: Once | ORAL | Status: AC
  Administered 2022-08-13: 40 meq via ORAL
  Filled 2022-08-13: qty 2

## 2022-08-13 MED ORDER — PHENAZOPYRIDINE HCL 100 MG PO TABS
100.0000 mg | ORAL_TABLET | Freq: Once | ORAL | Status: AC
  Administered 2022-08-13: 100 mg via ORAL
  Filled 2022-08-13: qty 1

## 2022-08-13 NOTE — Progress Notes (Signed)
       CROSS COVER NOTE  NAME: DEMARRIO MENGES MRN: [id number] DOB : June 24, 1936    HPI/Events of Note   Nurse reports displaced foley with dried bloody secretions imeding sfe assessment and reposition therefore, was removed  Assessment and  Interventions   Assessment:  Plan: Leave foley out - monitor output      Kathlene Cote NP [facility]

## 2022-08-13 NOTE — Progress Notes (Signed)
PROGRESS NOTE    Jack Rios  YCX:[id number] DOB: Aug 30, 1935 DOA: 08/08/2022 PCP: [facility]   Brief Narrative:  87 y.o. male with past medical history significant for asthma/COPD, dementia, BPH, essential hypertension, dyslipidemia, GERD who presented to [facility] ED on 12/30 with generalized weakness, myalgias, nonproductive cough, and shortness of breath. Influenza A PCR positive. RSV PCR negative. COVID-19 PCR negative. Urinalysis unrevealing. CT head without contrast with no acute intracranial abnormality, noted cerebral atrophy and microvascular disease. Chest x-ray with no acute cardiopulmonary disease process. CT angiogram chest negative for pulmonary embolism, increasing nodularity left upper lobe suspicious for neoplastic involvement. CT abdomen/pelvis with diverticulosis without diverticulitis, no acute abnormality. Urine culture and blood cultures obtained. EDP consulted [facility] for admission for further evaluation management of acute hypoxic respiratory failure secondary to influenza A viral infection.  PT recommended SNF but patient declined   Assessment & Plan:  Principal Problem:   Acute respiratory failure with hypoxia ([place]) Active Problems:   Essential hypertension   Influenza A   Generalized weakness   Dyslipidemia   Asthma, chronic   BPH (benign prostatic hyperplasia)   GERD without esophagitis   Dementia without behavioral disturbance (HCC)   Acute hypoxic respiratory failure, POA Influenza A viral infection Hx asthma/COPD Still remains on 5 L nasal cannula, not on any home oxygen.  Influenza a PCR positive.  COVID/RSV PCR negative.  No leukocytosis.  CT angiogram chest negative for PE or concern for infiltrate.  Procalcitonin elevated, BNP negative -- Tamiflu 30 mg p.o. twice daily; last day 1/3 -- Bronchodilators scheduled and as needed --Solu-Medrol 40 mg IV q12h -- Supplemental oxygen   Superimposed bacterial pneumonia influenza A infection -- Rocephin  and azithromycin until 1/7 -- Continue supplemental oxygen, -- Repeat CXR 1/3= persistent atelactasis   Acute urinary retention Continue Flomax and finasteride.  Foley removed due to some issues with dried blood overnight   Left upper lobe nodularity Patient with history of tobacco abuse, CT angiogram with finding of increased nodularity left upper lobe suspicious for neoplastic involvement.  Outpatient follow-up with PCP for further evaluation.   Essential hypertension -- Losartan 25 mg p.o. daily -- Continue aspirin and statin   Dyslipidemia -- Atorvastatin 20 mg p.o. daily   BPH:  -- Finasteride 5 mg p.o. daily -- Tamsulosin 0.4 mg p.o. daily   GERD -- Protonix 40 mg p.o. daily   Iron deficiency anemia Macrocytosis Ferrous sulfate 325 mg p.o. daily TSH and B12 normal   Dementia -- Supportive care   Weakness/debility/deconditioning: PT rec mended SNF but patient and family declined.  Eventually will go home with home health   Hypokalemia-as needed repletion    DVT prophylaxis: Lovenox Code Status: DNR Family Communication: Yolanda Bonine is bedside  Status is: Inpatient Ongoing management for pneumonia.  Declined SNF, eventually home with home health once breathing symptoms have improved He is still on oxygen 4 L  Procedures:  Foley catheter 1/1   Antimicrobials:  Tamiflu 12/30>> Azithromycin 1/2>> Ceftriaxone 1/2>>  Subjective: Patient feels okay this morning.  Did not sleep much last night and also ended up pulling out his Foley catheter.   Examination: Constitutional: Not in acute distress, 4 L nasal cannula.  Elderly frail Respiratory: Mild bibasilar crackles Cardiovascular: Normal sinus rhythm, no rubs Abdomen: Nontender nondistended good bowel sounds Musculoskeletal: No edema noted Skin: No rashes seen Neurologic: CN 2-12 grossly intact.  And nonfocal Psychiatric: Normal judgment and insight. Alert and oriented x 3. Normal  mood.   Objective:  Vitals:   08/12/22 2244 08/13/22 0548 08/13/22 0735 08/13/22 0759  BP:  (!) 141/81 (!) 142/73   Pulse:  77 81   Resp:  20 18   Temp:  (!) 97.5 F (36.4 C) 98.5 F (36.9 C)   TempSrc:      SpO2: 91% 91% 91% 90%  Weight:      Height:       No intake or output data in the 24 hours ending 08/13/22 0818  Filed Weights   08/08/22 2321  Weight: 86.2 kg     Data Reviewed:   CBC: Recent Labs  Lab 08/08/22 2333 08/09/22 0246 08/10/22 0936 08/11/22 0545 08/12/22 0553 08/13/22 0642  WBC 6.0 6.6 7.6 6.4 5.9 9.2  NEUTROABS 4.9  --  6.4  --   --   --   HGB 10.4* 10.5* 10.0* 10.5* 10.1* 9.5*  HCT 30.2* 31.2* 29.1* 31.2* 29.6* 27.8*  MCV 117.5* 118.2* 116.9* 115.6* 115.6* 113.9*  PLT 353 363 319 360 344 086*   Basic Metabolic Panel: Recent Labs  Lab 08/09/22 0246 08/10/22 0936 08/11/22 0545 08/12/22 0553 08/13/22 0642  NA 138 137 140 142 140  K 3.8 3.8 3.3* 3.5 3.3*  CL 106 104 104 107 105  CO2 25 23 24 26 24   GLUCOSE 124* 111* 124* 189* 167*  BUN 18 24* 28* 36* 45*  CREATININE 0.98 1.33* 1.37* 1.21 1.13  CALCIUM 8.1* 8.1* 8.4* 8.6* 8.6*  MG  --   --  2.0 2.3 2.3   GFR: Estimated Creatinine Clearance: 50.2 mL/min (by C-G formula based on SCr of 1.13 mg/dL). Liver Function Tests: Recent Labs  Lab 08/08/22 2333  AST 52*  ALT 61*  ALKPHOS 70  BILITOT 1.2  PROT 7.2  ALBUMIN 4.2   No results for input(s): "LIPASE", "AMYLASE" in the last 168 hours. No results for input(s): "AMMONIA" in the last 168 hours. Coagulation Profile: Recent Labs  Lab 08/08/22 2333  INR 1.3*   Cardiac Enzymes: No results for input(s): "CKTOTAL", "CKMB", "CKMBINDEX", "TROPONINI" in the last 168 hours. BNP (last 3 results) No results for input(s): "PROBNP" in the last 8760 hours. HbA1C: No results for input(s): "HGBA1C" in the last 72 hours. CBG: No results for input(s): "GLUCAP" in the last 168 hours. Lipid Profile: No results for input(s): "CHOL", "HDL",  "LDLCALC", "TRIG", "CHOLHDL", "LDLDIRECT" in the last 72 hours. Thyroid Function Tests: Recent Labs    08/12/22 0550  TSH 0.411   Anemia Panel: Recent Labs    08/12/22 0840  VITAMINB12 641   Sepsis Labs: Recent Labs  Lab 08/08/22 2333 08/09/22 0246 08/10/22 0550 08/11/22 0545 08/12/22 0553  PROCALCITON 0.19 0.21 2.34 2.82 2.90  LATICACIDVEN 1.8  --   --   --   --     Recent Results (from the past 240 hour(s))  Resp panel by RT-PCR (RSV, Flu A&B, Covid) Anterior Nasal Swab     Status: Abnormal   Collection Time: 08/08/22 11:33 PM   Specimen: Anterior Nasal Swab  Result Value Ref Range Status   SARS Coronavirus 2 by RT PCR NEGATIVE NEGATIVE Final    Comment: (NOTE) SARS-CoV-2 target nucleic acids are NOT DETECTED.  The SARS-CoV-2 RNA is generally detectable in upper respiratory specimens during the acute phase of infection. The lowest concentration of SARS-CoV-2 viral copies this assay can detect is 138 copies/mL. A negative result does not preclude SARS-Cov-2 infection and should not be used as the sole basis for treatment or other patient management  decisions. A negative result may occur with  improper specimen collection/handling, submission of specimen other than nasopharyngeal swab, presence of viral mutation(s) within the areas targeted by this assay, and inadequate number of viral copies(<138 copies/mL). A negative result must be combined with clinical observations, patient history, and epidemiological information. The expected result is Negative.  Fact Sheet for Patients:  BloggerCourse.com  Fact Sheet for Healthcare Providers:  SeriousBroker.it  This test is no t yet approved or cleared by the [place] FDA and  has been authorized for detection and/or diagnosis of SARS-CoV-2 by FDA under an Emergency Use Authorization (EUA). This EUA will remain  in effect (meaning this test can be used) for the  duration of the COVID-19 declaration under Section 564(b)(1) of the Act, 21 U.S.C.section 360bbb-3(b)(1), unless the authorization is terminated  or revoked sooner.       Influenza A by PCR POSITIVE (A) NEGATIVE Final   Influenza B by PCR NEGATIVE NEGATIVE Final    Comment: (NOTE) The Xpert Xpress SARS-CoV-2/FLU/RSV plus assay is intended as an aid in the diagnosis of influenza from Nasopharyngeal swab specimens and should not be used as a sole basis for treatment. Nasal washings and aspirates are unacceptable for Xpert Xpress SARS-CoV-2/FLU/RSV testing.  Fact Sheet for Patients: BloggerCourse.com  Fact Sheet for Healthcare Providers: SeriousBroker.it  This test is not yet approved or cleared by the [place] FDA and has been authorized for detection and/or diagnosis of SARS-CoV-2 by FDA under an Emergency Use Authorization (EUA). This EUA will remain in effect (meaning this test can be used) for the duration of the COVID-19 declaration under Section 564(b)(1) of the Act, 21 U.S.C. section 360bbb-3(b)(1), unless the authorization is terminated or revoked.     Resp Syncytial Virus by PCR NEGATIVE NEGATIVE Final    Comment: (NOTE) Fact Sheet for Patients: BloggerCourse.com  Fact Sheet for Healthcare Providers: SeriousBroker.it  This test is not yet approved or cleared by the [place] FDA and has been authorized for detection and/or diagnosis of SARS-CoV-2 by FDA under an Emergency Use Authorization (EUA). This EUA will remain in effect (meaning this test can be used) for the duration of the COVID-19 declaration under Section 564(b)(1) of the Act, 21 U.S.C. section 360bbb-3(b)(1), unless the authorization is terminated or revoked.  Performed at [facility], [address] [place], [place] [address]   Blood Culture (routine x 2)     Status: None    Collection Time: 08/08/22 11:33 PM   Specimen: BLOOD  Result Value Ref Range Status   Specimen Description BLOOD BLOOD LEFT FOREARM  Final   Special Requests   Final    BOTTLES DRAWN AEROBIC AND ANAEROBIC Blood Culture results may not be optimal due to an inadequate volume of blood received in culture bottles   Culture   Final    NO GROWTH 5 DAYS Performed at [facility], [address], [place], [place] [address]    Report Status 08/13/2022 FINAL  Final  Blood Culture (routine x 2)     Status: None   Collection Time: 08/08/22 11:33 PM   Specimen: BLOOD  Result Value Ref Range Status   Specimen Description BLOOD BLOOD RIGHT FOREARM  Final   Special Requests   Final    BOTTLES DRAWN AEROBIC AND ANAEROBIC Blood Culture adequate volume   Culture   Final    NO GROWTH 5 DAYS Performed at [facility], [address] [place], [place] [address]    Report Status  08/13/2022 FINAL  Final  Urine Culture     Status: None   Collection Time: 08/08/22 11:33 PM   Specimen: In/Out Cath Urine  Result Value Ref Range Status   Specimen Description   Final    IN/OUT CATH URINE Performed at [facility], [address], [place], [place] [address]    Special Requests   Final    NONE Performed at [facility], [address] [place], [place] [address]    Culture   Final    NO GROWTH Performed at [facility], [address] [place], [place] [address]    Report Status 08/10/2022 FINAL  Final         Radiology Studies: DG Chest Port 1 View  Result Date: 08/12/2022 CLINICAL DATA:  Shortness of breath EXAM: PORTABLE CHEST 1 VIEW COMPARISON:  Two days ago FINDINGS: Low volume chest with indistinct density at the bases. Stable left apical opacity recently evaluated by chest CT. No visible pneumothorax. Normal heart size and mediastinal contours. IMPRESSION: Low volume chest with atelectasis or infiltrate at the bases. Electronically Signed   By:  Tiburcio Pea M.D.   On: 08/12/2022 08:17        Scheduled Meds:  arformoterol  15 mcg Nebulization BID   aspirin  81 mg Oral Daily   atorvastatin  20 mg Oral QHS   budesonide (PULMICORT) nebulizer solution  0.5 mg Nebulization BID   Chlorhexidine Gluconate Cloth  6 each Topical Daily   docusate sodium  100 mg Oral Daily   donepezil  10 mg Oral Daily   enoxaparin (LOVENOX) injection  40 mg Subcutaneous Q24H   ferrous sulfate  325 mg Oral QPM   finasteride  5 mg Oral Daily   fluticasone  2 spray Each Nare Daily   gabapentin  300 mg Oral TID   guaiFENesin  600 mg Oral BID   hydroxyurea  500 mg Oral QHS   loratadine  10 mg Oral Daily   losartan  25 mg Oral Daily   methylPREDNISolone (SOLU-MEDROL) injection  40 mg Intravenous Q12H   oseltamivir  30 mg Oral BID   pantoprazole  40 mg Oral Daily   pneumococcal 20-valent conjugate vaccine  0.5 mL Intramuscular Tomorrow-1000   potassium chloride  40 mEq Oral Once   tamsulosin  0.4 mg Oral Daily   tiotropium  18 mcg Inhalation Daily   Vitamin D (Ergocalciferol)  50,000 Units Oral Q7 days   Continuous Infusions:  azithromycin Stopped (08/12/22 1230)   cefTRIAXone (ROCEPHIN)  IV Stopped (08/12/22 0952)     LOS: 4 days   Time spent= 35 mins    Edrik Rundle Joline Maxcy, MD [facility]  If 7PM-7AM, please contact night-coverage  08/13/2022, 8:18 AM

## 2022-08-13 NOTE — Progress Notes (Signed)
PT Cancellation Note  Patient Details Name: Jack Rios MRN: [id number] DOB: [birth year]   Cancelled Treatment:    Reason Eval/Treat Not Completed: Patient declined, no reason specified. Patient seems more lethargic/confused today and son at bedside reports he did not sleep at all last night. Patient states he prefers to rest. Will re-attempt tomorrow.    Suezette Lafave 08/13/2022, 2:43 PM

## 2022-08-14 DIAGNOSIS — J9601 Acute respiratory failure with hypoxia: Secondary | ICD-10-CM | POA: Diagnosis not present

## 2022-08-14 LAB — CBC
HCT: 29.2 % — ABNORMAL LOW (ref 39.0–52.0)
Hemoglobin: 10 g/dL — ABNORMAL LOW (ref 13.0–17.0)
MCH: 38.8 pg — ABNORMAL HIGH (ref 26.0–34.0)
MCHC: 34.2 g/dL (ref 30.0–36.0)
MCV: 113.2 fL — ABNORMAL HIGH (ref 80.0–100.0)
Platelets: 501 10*3/uL — ABNORMAL HIGH (ref 150–400)
RBC: 2.58 MIL/uL — ABNORMAL LOW (ref 4.22–5.81)
RDW: 15.9 % — ABNORMAL HIGH (ref 11.5–15.5)
WBC: 11.1 10*3/uL — ABNORMAL HIGH (ref 4.0–10.5)
nRBC: 0.7 % — ABNORMAL HIGH (ref 0.0–0.2)

## 2022-08-14 LAB — BASIC METABOLIC PANEL
Anion gap: 9 (ref 5–15)
BUN: 44 mg/dL — ABNORMAL HIGH (ref 8–23)
CO2: 23 mmol/L (ref 22–32)
Calcium: 8.4 mg/dL — ABNORMAL LOW (ref 8.9–10.3)
Chloride: 108 mmol/L (ref 98–111)
Creatinine, Ser: 1.14 mg/dL (ref 0.61–1.24)
GFR, Estimated: >60 mL/min (ref >60)
Glucose, Bld: 153 mg/dL — ABNORMAL HIGH (ref 70–99)
Potassium: 3.3 mmol/L — ABNORMAL LOW (ref 3.5–5.1)
Sodium: 140 mmol/L (ref 135–145)

## 2022-08-14 LAB — MAGNESIUM: Magnesium: 2.5 mg/dL — ABNORMAL HIGH (ref 1.7–2.4)

## 2022-08-14 MED ORDER — PREDNISONE 10 MG PO TABS: ORAL_TABLET | ORAL | 0 refills | Status: AC

## 2022-08-14 MED ORDER — POTASSIUM CHLORIDE CRYS ER 20 MEQ PO TBCR
40.0000 meq | EXTENDED_RELEASE_TABLET | Freq: Once | ORAL | Status: AC
  Administered 2022-08-14: 40 meq via ORAL
  Filled 2022-08-14: qty 2

## 2022-08-14 MED ORDER — AZITHROMYCIN 250 MG PO TABS: 250.0000 mg | ORAL_TABLET | Freq: Every day | ORAL | 0 refills | Status: AC

## 2022-08-14 MED ORDER — AMOXICILLIN-POT CLAVULANATE 875-125 MG PO TABS: 1.0000 | ORAL_TABLET | Freq: Two times a day (BID) | ORAL | 0 refills | Status: AC

## 2022-08-14 NOTE — Progress Notes (Signed)
Physical Therapy Treatment Patient Details Name: Jack Rios MRN: [id number] DOB: March 05, 1936 Today's Date: 08/14/2022   History of Present Illness Pt is an 87 year old patient admitted with influenza A after presenting with feeling sick with generalized malaise and myalgias; PMH significant for asthma, dementia, BPH, GERD, essential hypertension and dyslipidemia    PT Comments    Patient is agreeable to PT. Family member at the bedside. The patient continues to require physical assistance with all  mobility. Patient was able to stand x 2 bouts and take several side steps along edge of bed. Shortness of breath is noted with standing activity with cues for pursed lip breathing techniques and cues for energy conservation. The patient could benefit from SNF, however the family is hopeful for discharge home. The patient will need physical assistance with mobility at discharge.   Patient Saturations on Room Air at Rest = 85%  Patient Saturations on [employer] while Standing= 82%  Patient Saturation on 2 L02 after standing= 91%   Recommendations for follow up therapy are one component of a multi-disciplinary discharge planning process, led by the attending physician.  Recommendations may be updated based on patient status, additional functional criteria and insurance authorization.  Follow Up Recommendations  Skilled nursing-short term rehab (<3 hours/day) Can patient physically be transported by private vehicle: No   Assistance Recommended at Discharge Frequent or constant Supervision/Assistance  Patient can return home with the following A lot of help with walking and/or transfers;Assist for transportation;Help with stairs or ramp for entrance;A lot of help with bathing/dressing/bathroom   Equipment Recommendations   (daughter is requesting a shower chair)    Recommendations for Other Services       Precautions / Restrictions Precautions Precautions: Fall Restrictions Weight Bearing  Restrictions: No     Mobility  Bed Mobility Overal bed mobility: Needs Assistance Bed Mobility: Supine to Sit     Supine to sit: Max assist     General bed mobility comments: assistance for trunk and BLE support. verbal cues for technique and sequencing. increased time and effort required with mobility. removed supplemental oxygen for mobility with Sp02 85% initially with sitting up    Transfers Overall transfer level: Needs assistance Equipment used: Rolling walker (2 wheels) Transfers: Sit to/from Stand Sit to Stand: Mod assist           General transfer comment: lifting and lowering assistance provided. cues for anterior weight shifting and hand placement with standing.    Ambulation/Gait Ambulation/Gait assistance: Min assist Gait Distance (Feet): 3 Feet Assistive device: Rolling walker (2 wheels)   Gait velocity: decreased     General Gait Details: side steps along edge of bed. verbal cues for technique and cues for upright posture. Sp02 82% on room air   Stairs             Wheelchair Mobility    Modified Rankin (Stroke Patients Only)       Balance Overall balance assessment: Needs assistance Sitting-balance support: Feet supported Sitting balance-Leahy Scale: Good     Standing balance support: Bilateral upper extremity supported, During functional activity, Reliant on assistive device for balance Standing balance-Leahy Scale: Poor Standing balance comment: heavy use of rolling walker for standing                            Cognition Arousal/Alertness: Awake/alert Behavior During Therapy: Flat affect Overall Cognitive Status: Impaired/Different from baseline  Following Commands: Follows one step commands with increased time     Problem Solving: Slow processing, Decreased initiation, Difficulty sequencing, Requires verbal cues, Requires tactile cues          Exercises      General  Comments General comments (skin integrity, edema, etc.): cues for pursed lip breathing techniques      Pertinent Vitals/Pain Pain Assessment Pain Assessment: No/denies pain    Home Living                          Prior Function            PT Goals (current goals can now be found in the care plan section) Acute Rehab PT Goals Patient Stated Goal: to regain strength PT Goal Formulation: With patient/family Time For Goal Achievement: 08/19/22 Potential to Achieve Goals: Fair Progress towards PT goals: Progressing toward goals    Frequency    Min 2X/week      PT Plan Current plan remains appropriate    Co-evaluation              AM-PAC PT "6 Clicks" Mobility   Outcome Measure  Help needed turning from your back to your side while in a flat bed without using bedrails?: A Little Help needed moving from lying on your back to sitting on the side of a flat bed without using bedrails?: A Lot Help needed moving to and from a bed to a chair (including a wheelchair)?: A Little Help needed standing up from a chair using your arms (e.g., wheelchair or bedside chair)?: A Lot Help needed to walk in hospital room?: A Lot Help needed climbing 3-5 steps with a railing? : Total 6 Click Score: 13    End of Session Equipment Utilized During Treatment: Oxygen Activity Tolerance: Patient tolerated treatment well Patient left: in bed;with call bell/phone within reach;with bed alarm set (seated on edge of bed per patient requesting with family member present and breakfast tray set-up) Nurse Communication: Mobility status (Sp02 with and without 02) PT Visit Diagnosis: Difficulty in walking, not elsewhere classified (R26.2);Other abnormalities of gait and mobility (R26.89);Muscle weakness (generalized) (M62.81)     Time: 6948-5462 PT Time Calculation (min) (ACUTE ONLY): 21 min  Charges:  $Therapeutic Activity: 8-22 mins                     [name], PT,  MPT    [name] 08/14/2022, 9:23 AM

## 2022-08-14 NOTE — Discharge Instructions (Signed)
Recommendations for Outpatient Follow-up:  Follow up with PCP in 1-2 weeks Please obtain BMP/CBC in one week your next doctors visit.  2 more days of oral antibiotics prescribed Prednisone taper Outpatient follow-up with PCP for pulmonary nodule

## 2022-08-14 NOTE — Progress Notes (Signed)
Patient Saturations on Room Air at Rest = 85%   Patient Saturations on Room Air while Standing= 82%   Patient Saturation on 2 L02 after standing= 91%

## 2022-08-14 NOTE — Discharge Summary (Signed)
Physician Discharge Summary  Pershing ProudJames R Rios ZOX:[id number] DOB: 1936/07/30 DOA: 08/08/2022  PCP: [facility]  Admit date: 08/08/2022 Discharge date: 08/14/2022  Admitted From: Home Disposition: Home with home health  Recommendations for Outpatient Follow-up:  Follow up with PCP in 1-2 weeks Please obtain BMP/CBC in one week your next doctors visit.  2 more days of oral antibiotics prescribed Prednisone taper Outpatient follow-up with PCP for pulmonary nodule  Discharge Condition: Stable CODE STATUS: DNR Diet recommendation: Low-salt  Brief/Interim Summary: 87 y.o. male with past medical history significant for asthma/COPD, dementia, BPH, essential hypertension, dyslipidemia, GERD who [facility] ED on 12/30 with generalized weakness, myalgias, nonproductive cough, and shortness of breath. Influenza A PCR positive. RSV PCR negative. COVID-19 PCR negative. Urinalysis unrevealing. CT head without contrast with no acute intracranial abnormality, noted cerebral atrophy and microvascular disease. Chest x-ray with no acute cardiopulmonary disease process. CT angiogram chest negative for pulmonary embolism, increasing nodularity left upper lobe suspicious for neoplastic involvement. CT abdomen/pelvis with diverticulosis without diverticulitis, no acute abnormality. Urine culture and blood cultures obtained. EDP consulted [facility] for admission for further evaluation management of acute hypoxic respiratory failure secondary to influenza A viral infection.  PT recommended SNF but patient declined, home health was eventually arranged     Assessment & Plan:  Principal Problem:   Acute respiratory failure with hypoxia (HCC) Active Problems:   Essential hypertension   Influenza A   Generalized weakness   Dyslipidemia   Asthma, chronic   BPH (benign prostatic hyperplasia)   GERD without esophagitis   Dementia without behavioral disturbance (HCC)   Acute hypoxic respiratory failure,  POA Influenza A viral infection Hx asthma/COPD Still remains on 5 L nasal cannula, not on any home oxygen.  Influenza a PCR positive.  COVID/RSV PCR negative.  No leukocytosis.  CT angiogram chest negative for PE or concern for infiltrate.  Procalcitonin elevated, BNP negative.  Breathing much better, completed Tamiflu in the hospital.  Will transition to p.o. steroid, taper over next 4 days.  2 more days of oral antibiotics.   Superimposed bacterial pneumonia influenza A infection -- Rocephin and azithromycin until 1/7.  Transition to 2 more days of oral antibiotics. -- Continue supplemental oxygen, with ambulation his oxygen saturation drops below 88% therefore we will arrange for 2 L nasal cannula at home -- Repeat CXR 1/3= persistent atelactasis   Acute urinary retention, resolved Continue Flomax and finasteride.  Foley removed due to some issues with dried blood overnight   Left upper lobe nodularity Patient with history of tobacco abuse, CT angiogram with finding of increased nodularity left upper lobe suspicious for neoplastic involvement.  Outpatient follow-up with PCP for further evaluation.   Essential hypertension -- Losartan 25 mg p.o. daily -- Continue aspirin and statin   Dyslipidemia -- Atorvastatin 20 mg p.o. daily   BPH:  -- Finasteride 5 mg p.o. daily -- Tamsulosin 0.4 mg p.o. daily   GERD -- Protonix 40 mg p.o. daily   Iron deficiency anemia Macrocytosis Ferrous sulfate 325 mg p.o. daily TSH and B12 normal   Dementia -- Supportive care   Weakness/debility/deconditioning: PT rec mended SNF but patient and family declined.  Eventually will go home with home health   Hypokalemia-as needed repletion        Discharge Diagnoses:  Principal Problem:   Acute respiratory failure with hypoxia (HCC) Active Problems:   Essential hypertension   Influenza A   Generalized weakness   Dyslipidemia   Asthma, chronic  BPH (benign prostatic hyperplasia)    GERD without esophagitis   Dementia without behavioral disturbance ([place])      Consultations: None  Subjective: Overall feels well.  Family is at bedside.  Patient and both daughters prefer that patient go home instead of skilled nursing facility.  Discharge Exam: Vitals:   08/14/22 0521 08/14/22 0739  BP: (!) 147/82 (!) 152/90  Pulse: 77 72  Resp: 18 16  Temp: (!) 97.5 F (36.4 C) 97.6 F (36.4 C)  SpO2: 90% 92%   Vitals:   08/13/22 2341 08/14/22 0000 08/14/22 0521 08/14/22 0739  BP: 133/76 (!) 144/75 (!) 147/82 (!) 152/90  Pulse: 77 77 77 72  Resp: 20 18 18 16   Temp: 98.3 F (36.8 C) 97.9 F (36.6 C) (!) 97.5 F (36.4 C) 97.6 F (36.4 C)  TempSrc:  Oral Oral Oral  SpO2: 97% 91% 90% 92%  Weight:      Height:        General: Pt is alert, awake, not in acute distress, 2 L nasal cannula Cardiovascular: RRR, S1/S2 +, no rubs, no gallops Respiratory: CTA bilaterally, no wheezing, no rhonchi Abdominal: Soft, NT, ND, bowel sounds + Extremities: no edema, no cyanosis  Discharge Instructions   Allergies as of 08/14/2022       Reactions   Molds & Smuts         Medication List     TAKE these medications    acetaminophen 500 MG tablet Commonly known as: TYLENOL Take 500 mg by mouth every 6 (six) hours as needed.   albuterol 108 (90 Base) MCG/ACT inhaler Commonly known as: VENTOLIN HFA Inhale 2 puffs into the lungs every 4 (four) hours as needed for wheezing or shortness of breath.   amoxicillin-clavulanate 875-125 MG tablet Commonly known as: AUGMENTIN Take 1 tablet by mouth 2 (two) times daily for 2 days.   Asmanex (60 Metered Doses) 220 MCG/INH inhaler Generic drug: mometasone Inhale 2 puffs into the lungs daily.   aspirin 81 MG chewable tablet Chew 81 mg by mouth daily.   atorvastatin 40 MG tablet Commonly known as: LIPITOR Take 0.5 tablets by mouth at bedtime.   azithromycin 250 MG tablet Commonly known as: ZITHROMAX Take 1 tablet (250 mg  total) by mouth daily for 2 days.   cetirizine 5 MG tablet Commonly known as: ZYRTEC Take 5 mg by mouth daily.   chlorpheniramine 4 MG tablet Commonly known as: CHLOR-TRIMETON Take 4 mg by mouth at bedtime.   docusate sodium 100 MG capsule Commonly known as: COLACE Take 100 mg by mouth daily.   donepezil 10 MG tablet Commonly known as: ARICEPT Take 1 tablet by mouth daily.   ferrous sulfate 325 (65 FE) MG tablet Take 325 mg by mouth every evening.   finasteride 5 MG tablet Commonly known as: PROSCAR Take 5 mg by mouth daily.   fluticasone 50 MCG/ACT nasal spray Commonly known as: FLONASE Place 2 sprays into both nostrils 2 (two) times daily.   gabapentin 300 MG capsule Commonly known as: NEURONTIN Take 300 mg by mouth 3 (three) times daily.   hydroxypropyl methylcellulose / hypromellose 2.5 % ophthalmic solution Commonly known as: ISOPTO TEARS / GONIOVISC Place 1 drop into both eyes as needed for dry eyes.   hydroxyurea 500 MG capsule Commonly known as: HYDREA Take 500 mg by mouth at bedtime. May take with food to minimize GI side effects.   ibuprofen 200 MG tablet Commonly known as: ADVIL Take 200 mg by mouth  every 6 (six) hours as needed.   ipratropium 0.06 % nasal spray Commonly known as: ATROVENT Place 2 sprays into both nostrils every 12 (twelve) hours.   loratadine 10 MG tablet Commonly known as: CLARITIN Take 10 mg by mouth daily.   losartan 25 MG tablet Commonly known as: COZAAR Take 1 tablet by mouth daily.   pantoprazole 40 MG tablet Commonly known as: PROTONIX Take 1 tablet by mouth daily.   predniSONE 10 MG tablet Commonly known as: DELTASONE Take 4 tablets (40 mg total) by mouth daily with breakfast for 1 day, THEN 3 tablets (30 mg total) daily with breakfast for 1 day, THEN 2 tablets (20 mg total) daily with breakfast for 1 day, THEN 1 tablet (10 mg total) daily with breakfast for 1 day. Start taking on: August 14, 2022   tamsulosin 0.4  MG Caps capsule Commonly known as: FLOMAX Take 0.4 mg by mouth daily.   tiotropium 18 MCG inhalation capsule Commonly known as: SPIRIVA Place 18 mcg into inhaler and inhale daily.   traMADol 50 MG tablet Commonly known as: ULTRAM Take 50 mg by mouth every 12 (twelve) hours as needed for moderate pain.   Vitamin D (Ergocalciferol) 1.25 MG (50000 UNIT) Caps capsule Commonly known as: DRISDOL Take 50,000 Units by mouth every 7 (seven) days.               Durable Medical Equipment  (From admission, onward)           Start     Ordered   08/14/22 1056  For home use only DME oxygen  Once       Comments: Influenza Pneumonia; COPD  Question Answer Comment  Length of Need 6 Months   Mode or (Route) Nasal cannula   Liters per Minute 2   Frequency Continuous (stationary and portable oxygen unit needed)   Oxygen delivery system Gas      08/14/22 1056   08/13/22 0920  For home use only DME 3 n 1  Once        08/13/22 0919   08/12/22 1232  For home use only DME standard manual wheelchair with seat cushion  Once       Comments: Patient suffers from weakness which impairs their ability to perform daily activities like toileting in the home.  A walker will not resolve issue with performing activities of daily living. A wheelchair will allow patient to safely perform daily activities. Patient can safely propel the wheelchair in the home or has a caregiver who can provide assistance. Length of need 6 months . Accessories: elevating leg rests (ELRs), wheel locks, extensions and anti-tippers.   08/12/22 1231   08/12/22 1231  For home use only DME Walker rolling  Once       Question Answer Comment  Walker: With [place]   Patient needs a walker to treat with the following condition Generalized weakness      08/12/22 [place] Follow up in 1 week(s).   Specialty: General Practice Why: Patient to make own  appointment Contact information: [place] [id number] [phone]                [place] were cared for by a hospitalist during your hospital stay. If you have any questions about your  discharge medications or the care you received while you were in the hospital after you are discharged, you can call the unit and asked to speak with the hospitalist on call if the hospitalist that took care of you is not available. Once you are discharged, your primary care physician will handle any further medical issues. Please note that no refills for any discharge medications will be authorized once you are discharged, as it is imperative that you return to your primary care physician (or establish a relationship with a primary care physician if you do not have one) for your aftercare needs so that they can reassess your need for medications and monitor your lab values.   Procedures/Studies: DG Chest Port 1 View  Result Date: 08/12/2022 CLINICAL DATA:  Shortness of breath EXAM: PORTABLE CHEST 1 VIEW COMPARISON:  Two days ago FINDINGS: Low volume chest with indistinct density at the bases. Stable left apical opacity recently evaluated by chest CT. No visible pneumothorax. Normal heart size and mediastinal contours. IMPRESSION: Low volume chest with atelectasis or infiltrate at the bases. Electronically Signed   By: Tiburcio PeaJonathan  Watts M.D.   On: 08/12/2022 08:17   CT ABDOMEN PELVIS WO CONTRAST  Result Date: 08/10/2022 CLINICAL DATA:  Abdominal pain. EXAM: CT ABDOMEN AND PELVIS WITHOUT CONTRAST TECHNIQUE: Multidetector CT imaging of the abdomen and pelvis was performed following the standard protocol without IV contrast. RADIATION DOSE REDUCTION: This exam was performed according to the departmental dose-optimization program which includes automated exposure control, adjustment of the mA and/or kV according to patient size and/or use of  iterative reconstruction technique. COMPARISON:  August 09, 2022 FINDINGS: Lower chest: Marked severity posterior bibasilar and lingular airspace disease is noted. This represents a new finding when compared to the prior study. Hepatobiliary: No focal liver abnormality is seen. Status post cholecystectomy. No biliary dilatation. Pancreas: Punctate parenchymal calcifications are seen scattered throughout the pancreas. There is no evidence of surrounding inflammatory fat stranding or pancreatic ductal dilatation. Spleen: Normal in size without focal abnormality. Adrenals/Urinary Tract: Adrenal glands are unremarkable. Kidneys are normal in size, without renal calculi or hydronephrosis. Multiple large, stable bilateral simple renal cysts are seen. A Foley catheter is seen within an empty urinary bladder. Stomach/Bowel: Stomach is within normal limits. The appendix is not clearly identified. No evidence of bowel wall thickening, distention, or inflammatory changes. Noninflamed diverticula are seen throughout the sigmoid colon. Vascular/Lymphatic: Aortic atherosclerosis. No enlarged abdominal or pelvic lymph nodes. Reproductive: The prostate gland is mildly enlarged. Other: No abdominal wall hernia or abnormality. No abdominopelvic ascites. Musculoskeletal: Marked severity multilevel degenerative changes seen throughout the lumbar spine. IMPRESSION: 1. Marked severity posterior bibasilar and lingular airspace disease. 2. Evidence of prior cholecystectomy. 3. Sigmoid diverticulosis. 4. Multiple large, stable bilateral simple renal cysts. No follow-up imaging is recommended. This recommendation follows ACR consensus guidelines: Management of the Incidental Renal Mass on CT: A White Paper of the ACR Incidental Findings Committee. J Am Coll Radiol [phone];15:264-273. 5. Aortic atherosclerosis. Aortic Atherosclerosis (ICD10-I70.0). Electronically Signed   By: Aram Candelahaddeus  Houston M.D.   On: 08/10/2022 20:02   DG Chest Port 1  View  Result Date: 08/10/2022 CLINICAL DATA:  [id number] with respiratory crackles and a history of asthma. EXAM: PORTABLE CHEST 1 VIEW COMPARISON:  CTA chest yesterday FINDINGS: 5:05 a.m.  There are emphysematous and chronic changes. Left apical postsurgical changes are again noted with possible 3 cm mass developing compared with the PET-CT of 09/25/2019. The lungs are hyperexpanded with atelectatic changes in the  bases and otherwise clear. The cardiomediastinal silhouette is stable. There is aortic atherosclerosis. No pleural effusion is seen. There is a chronic fracture deformity of the mid right clavicle and advanced left shoulder DJD. IMPRESSION: 1. Left apical postsurgical changes are again noted with possible 3 cm mass developing compared with the PET-CT of 09/25/2019. 2. COPD. 3. Aortic atherosclerosis. Electronically Signed   By: Almira BarKeith  Chesser M.D.   On: 08/10/2022 05:48   CT Angio Chest PE W and/or Wo Contrast  Result Date: 08/09/2022 CLINICAL DATA:  New onset hypoxia and tachycardia, generalized weakness EXAM: CT ANGIOGRAPHY CHEST CT ABDOMEN AND PELVIS WITH CONTRAST TECHNIQUE: Multidetector CT imaging of the chest was performed using the standard protocol during bolus administration of intravenous contrast. Multiplanar CT image reconstructions and MIPs were obtained to evaluate the vascular anatomy. Multidetector CT imaging of the abdomen and pelvis was performed using the standard protocol during bolus administration of intravenous contrast. RADIATION DOSE REDUCTION: This exam was performed according to the departmental dose-optimization program which includes automated exposure control, adjustment of the mA and/or kV according to patient size and/or use of iterative reconstruction technique. CONTRAST:  100mL OMNIPAQUE IOHEXOL 350 MG/ML SOLN COMPARISON:  Chest x-ray from the previous day, PET-CT from 09/25/2019. FINDINGS: CTA CHEST FINDINGS Cardiovascular: Thoracic aorta demonstrates atherosclerotic  calcifications. No aneurysmal dilatation or dissection is seen. No cardiac enlargement is noted. No pericardial effusion is seen. Mild coronary calcifications are noted. The pulmonary artery shows a normal branching pattern without intraluminal filling defect to suggest pulmonary embolism. Mediastinum/Nodes: Thoracic inlet is within normal limits. Esophagus is partially fluid-filled likely related to reflux. No hilar or mediastinal adenopathy is noted. Lungs/Pleura: Lungs are well aerated bilaterally. Mild emphysematous changes are seen. Basilar atelectasis is noted. In the left upper lobe there are changes consistent with prior resection with a surgical suture line superiorly. Additionally there is increasing soft tissue in the apex adjacent to the suture line which could be progressive neoplastic involvement. Musculoskeletal: No acute rib abnormality is noted. Degenerative changes of the thoracic spine are noted. Midthoracic hemangioma is seen. Review of the MIP images confirms the above findings. CT ABDOMEN and PELVIS FINDINGS Hepatobiliary: No focal liver abnormality is seen. Status post cholecystectomy. No biliary dilatation. Pancreas: Unremarkable. No pancreatic ductal dilatation or surrounding inflammatory changes. Spleen: Normal in size without focal abnormality. Adrenals/Urinary Tract: Adrenal glands are within normal limits. Multiple simple appearing cysts are noted bilaterally stable from the prior study. No further follow-up is recommended. Atherosclerotic calcifications of the renal arteries are seen. No renal calculi or obstructive changes are noted. The bladder is partially distended. Stomach/Bowel: Diverticular change of the colon is noted without evidence of diverticulitis. No obstructive changes are seen. The appendix is partially visualized. Small bowel and stomach are within normal limits. Vascular/Lymphatic: Aortic atherosclerosis. No enlarged abdominal or pelvic lymph nodes. Reproductive:  Prostate is unremarkable. Other: No abdominal wall hernia or abnormality. No abdominopelvic ascites. Musculoskeletal: No acute or significant osseous findings. Review of the MIP images confirms the above findings. IMPRESSION: CTA of the chest: No evidence of pulmonary emboli. Increasing nodularity in the left upper lobe adjacent to a suture line highly suspicious for neoplastic involvement. Further workup is recommended. CT of the abdomen and pelvis: Diverticulosis without diverticulitis. Chronic changes without acute abnormality. Electronically Signed   By: Alcide CleverMark  Lukens M.D.   On: 08/09/2022 01:16   CT ABDOMEN PELVIS W CONTRAST  Result Date: 08/09/2022 CLINICAL DATA:  New onset hypoxia and tachycardia, generalized weakness EXAM: CT ANGIOGRAPHY  CHEST CT ABDOMEN AND PELVIS WITH CONTRAST TECHNIQUE: Multidetector CT imaging of the chest was performed using the standard protocol during bolus administration of intravenous contrast. Multiplanar CT image reconstructions and MIPs were obtained to evaluate the vascular anatomy. Multidetector CT imaging of the abdomen and pelvis was performed using the standard protocol during bolus administration of intravenous contrast. RADIATION DOSE REDUCTION: This exam was performed according to the departmental dose-optimization program which includes automated exposure control, adjustment of the mA and/or kV according to patient size and/or use of iterative reconstruction technique. CONTRAST:  OMNIPAQUE IOHEXOL 350 MG/ML SOLN COMPARISON:  Chest x-ray from the previous day, PET-CT from 09/25/2019. FINDINGS: CTA CHEST FINDINGS Cardiovascular: Thoracic aorta demonstrates atherosclerotic calcifications. No aneurysmal dilatation or dissection is seen. No cardiac enlargement is noted. No pericardial effusion is seen. Mild coronary calcifications are noted. The pulmonary artery shows a normal branching pattern without intraluminal filling defect to suggest pulmonary embolism.  Mediastinum/Nodes: Thoracic inlet is within normal limits. Esophagus is partially fluid-filled likely related to reflux. No hilar or mediastinal adenopathy is noted. Lungs/Pleura: Lungs are well aerated bilaterally. Mild emphysematous changes are seen. Basilar atelectasis is noted. In the left upper lobe there are changes consistent with prior resection with a surgical suture line superiorly. Additionally there is increasing soft tissue in the apex adjacent to the suture line which could be progressive neoplastic involvement. Musculoskeletal: No acute rib abnormality is noted. Degenerative changes of the thoracic spine are noted. Midthoracic hemangioma is seen. Review of the MIP images confirms the above findings. CT ABDOMEN and PELVIS FINDINGS Hepatobiliary: No focal liver abnormality is seen. Status post cholecystectomy. No biliary dilatation. Pancreas: Unremarkable. No pancreatic ductal dilatation or surrounding inflammatory changes. Spleen: Normal in size without focal abnormality. Adrenals/Urinary Tract: Adrenal glands are within normal limits. Multiple simple appearing cysts are noted bilaterally stable from the prior study. No further follow-up is recommended. Atherosclerotic calcifications of the renal arteries are seen. No renal calculi or obstructive changes are noted. The bladder is partially distended. Stomach/Bowel: Diverticular change of the colon is noted without evidence of diverticulitis. No obstructive changes are seen. The appendix is partially visualized. Small bowel and stomach are within normal limits. Vascular/Lymphatic: Aortic atherosclerosis. No enlarged abdominal or pelvic lymph nodes. Reproductive: Prostate is unremarkable. Other: No abdominal wall hernia or abnormality. No abdominopelvic ascites. Musculoskeletal: No acute or significant osseous findings. Review of the MIP images confirms the above findings. IMPRESSION: CTA of the chest: No evidence of pulmonary emboli. Increasing  nodularity in the left upper lobe adjacent to a suture line highly suspicious for neoplastic involvement. Further workup is recommended. CT of the abdomen and pelvis: Diverticulosis without diverticulitis. Chronic changes without acute abnormality. Electronically Signed   By: Alcide Clever M.D.   On: 08/09/2022 01:16   CT HEAD WO CONTRAST ( )  Result Date: 08/09/2022 CLINICAL DATA:  Generalized weakness. EXAM: CT HEAD WITHOUT CONTRAST TECHNIQUE: Contiguous axial images were obtained from the base of the skull through the vertex without intravenous contrast. RADIATION DOSE REDUCTION: This exam was performed according to the departmental dose-optimization program which includes automated exposure control, adjustment of the mA and/or kV according to patient size and/or use of iterative reconstruction technique. COMPARISON:  None Available. FINDINGS: Brain: There is mild to moderate severity cerebral atrophy with widening of the extra-axial spaces and ventricular dilatation. There are areas of decreased attenuation within the white matter tracts of the supratentorial brain, consistent with microvascular disease changes. Vascular: No hyperdense vessel or unexpected calcification. Skull: Normal.  Negative for fracture or focal lesion. 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 Other: None. IMPRESSION: 1. No acute intracranial abnormality. 2. Cerebral atrophy and microvascular disease changes of the supratentorial brain. Electronically Signed   By: Aram Candela M.D.   On: 08/09/2022 01:12   DG Chest Port 1 View  Result Date: 08/08/2022 CLINICAL DATA:  Cough and weakness EXAM: PORTABLE CHEST 1 VIEW COMPARISON:  04/30/17 FINDINGS: Cardiac shadow is within normal limits. Lungs are well aerated bilaterally. Mild interstitial changes are noted without focal infiltrate. No bony abnormality is seen. IMPRESSION: No acute abnormality noted. Electronically Signed   By: Alcide Clever M.D.   On: 08/08/2022 23:47     The results of significant diagnostics from this hospitalization (including imaging, microbiology, ancillary and laboratory) are listed below for reference.     Microbiology: Recent Results (from the past 240 hour(s))  Resp panel by RT-PCR (RSV, Flu A&B, Covid) Anterior Nasal Swab     Status: Abnormal   Collection Time: 08/08/22 11:33 PM   Specimen: Anterior Nasal Swab  Result Value Ref Range Status   SARS Coronavirus 2 by RT PCR NEGATIVE NEGATIVE Final    Comment: (NOTE) SARS-CoV-2 target nucleic acids are NOT DETECTED.  The SARS-CoV-2 RNA is generally detectable in upper respiratory specimens during the acute phase of infection. The lowest concentration of SARS-CoV-2 viral copies this assay can detect is 138 copies/mL. A negative result does not preclude SARS-Cov-2 infection and should not be used as the sole basis for treatment or other patient management decisions. A negative result may occur with  improper specimen collection/handling, submission of specimen other than nasopharyngeal swab, presence of viral mutation(s) within the areas targeted by this assay, and inadequate number of viral copies(<138 copies/mL). A negative result must be combined with clinical observations, patient history, and epidemiological information. The expected result is Negative.  Fact Sheet for Patients:  BloggerCourse.com  Fact Sheet for Healthcare Providers:  SeriousBroker.it  This test is no t yet approved or cleared by the [place] FDA and  has been authorized for detection and/or diagnosis of SARS-CoV-2 by FDA under an Emergency Use Authorization (EUA). This EUA will remain  in effect (meaning this test can be used) for the duration of the COVID-19 declaration under Section 564(b)(1) of the Act, 21 U.S.C.section 360bbb-3(b)(1), unless the authorization is terminated  or revoked sooner.        Influenza A by PCR POSITIVE (A) NEGATIVE Final   Influenza B by PCR NEGATIVE NEGATIVE Final    Comment: (NOTE) The Xpert Xpress SARS-CoV-2/FLU/RSV plus assay is intended as an aid in the diagnosis of influenza from Nasopharyngeal swab specimens and should not be used as a sole basis for treatment. Nasal washings and aspirates are unacceptable for Xpert Xpress SARS-CoV-2/FLU/RSV testing.  Fact Sheet for Patients: BloggerCourse.com  Fact Sheet for Healthcare Providers: SeriousBroker.it  This test is not yet approved or cleared by the [place] FDA and has been authorized for detection and/or diagnosis of SARS-CoV-2 by FDA under an Emergency Use Authorization (EUA). This EUA will remain in effect (meaning this test can be used) for the duration of the COVID-19 declaration under Section 564(b)(1) of the Act, 21 U.S.C. section 360bbb-3(b)(1), unless the authorization is terminated or revoked.     Resp Syncytial Virus by PCR NEGATIVE NEGATIVE Final    Comment: (NOTE) Fact Sheet for Patients: BloggerCourse.com  Fact Sheet for Healthcare Providers: SeriousBroker.it  This  test is not yet approved or cleared by the [place] and has been authorized for detection and/or diagnosis of SARS-CoV-2 by FDA under an Emergency Use Authorization (EUA). This EUA will remain in effect (meaning this test can be used) for the duration of the COVID-19 declaration under Section 564(b)(1) of the Act, 21 U.S.C. section 360bbb-3(b)(1), unless the authorization is terminated or revoked.  Performed at [facility], [address] [place], [place] [address]   Blood Culture (routine x 2)     Status: None   Collection Time: 08/08/22 11:33 PM   Specimen: BLOOD  Result Value Ref Range Status   Specimen Description BLOOD BLOOD LEFT FOREARM  Final   Special Requests   Final    BOTTLES  DRAWN AEROBIC AND ANAEROBIC Blood Culture results may not be optimal due to an inadequate volume of blood received in culture bottles   Culture   Final    NO GROWTH 5 DAYS Performed at [facility], [address], [place], [place] [address]    Report Status 08/13/2022 FINAL  Final  Blood Culture (routine x 2)     Status: None   Collection Time: 08/08/22 11:33 PM   Specimen: BLOOD  Result Value Ref Range Status   Specimen Description BLOOD BLOOD RIGHT FOREARM  Final   Special Requests   Final    BOTTLES DRAWN AEROBIC AND ANAEROBIC Blood Culture adequate volume   Culture   Final    NO GROWTH 5 DAYS Performed at [facility], [address], [place], [place] [address]    Report Status 08/13/2022 FINAL  Final  Urine Culture     Status: None   Collection Time: 08/08/22 11:33 PM   Specimen: In/Out Cath Urine  Result Value Ref Range Status   Specimen Description   Final    IN/OUT CATH URINE Performed at [facility], [address], [place], [place] [address]    Special Requests   Final    NONE Performed at [facility], [address] [place], [place] [address]    Culture   Final    NO GROWTH Performed at [facility], [address] [place], [place] [address]    Report Status 08/10/2022 FINAL  Final     Labs: BNP (last 3 results) Recent Labs    08/12/22 0550  BNP 86.4   Basic Metabolic Panel: Recent Labs  Lab 08/10/22 0936 08/11/22 0545 08/12/22 0553 08/13/22 0642 08/14/22 0410  NA 137 140 142 140 140  K 3.8 3.3* 3.5 3.3* 3.3*  CL 104 104 107 105 108  CO2 23 24 26 24 23   GLUCOSE 111* 124* 189* 167* 153*  BUN 24* 28* 36* 45* 44*  CREATININE 1.33* 1.37* 1.21 1.13 1.14  CALCIUM 8.1* 8.4* 8.6* 8.6* 8.4*  MG  --  2.0 2.3 2.3 2.5*   Liver Function Tests: Recent Labs  Lab 08/08/22 2333  AST 52*  ALT 61*  ALKPHOS 70  BILITOT 1.2  PROT 7.2  ALBUMIN 4.2   No results for input(s): "LIPASE", "AMYLASE" in the last  168 hours. No results for input(s): "AMMONIA" in the last 168 hours. CBC: Recent Labs  Lab 08/08/22 2333 08/09/22 0246 08/10/22 0936 08/11/22 0545 08/12/22 0553 08/13/22 0642 08/14/22 0410  WBC 6.0   < > 7.6 6.4 5.9 9.2 11.1*  NEUTROABS 4.9  --  6.4  --   --   --   --   HGB 10.4*   < >  10.0* 10.5* 10.1* 9.5* 10.0*  HCT 30.2*   < > 29.1* 31.2* 29.6* 27.8* 29.2*  MCV 117.5*   < > 116.9* 115.6* 115.6* 113.9* 113.2*  PLT 353   < > 319 360 344 414* 501*   < > = values in this interval not displayed.   Cardiac Enzymes: No results for input(s): "CKTOTAL", "CKMB", "CKMBINDEX", "TROPONINI" in the last 168 hours. BNP: Invalid input(s): "POCBNP" CBG: No results for input(s): "GLUCAP" in the last 168 hours. D-Dimer No results for input(s): "DDIMER" in the last 72 hours. Hgb A1c No results for input(s): "HGBA1C" in the last 72 hours. Lipid Profile No results for input(s): "CHOL", "HDL", "LDLCALC", "TRIG", "CHOLHDL", "LDLDIRECT" in the last 72 hours. Thyroid function studies Recent Labs    08/12/22 0550  TSH 0.411   Anemia work up Recent Labs    08/12/22 0840  VITAMINB12 641   Urinalysis    Component Value Date/Time   COLORURINE YELLOW (A) 08/08/2022 2333   APPEARANCEUR CLEAR (A) 08/08/2022 2333   LABSPEC 1.023 08/08/2022 2333   PHURINE 5.0 08/08/2022 2333   GLUCOSEU NEGATIVE 08/08/2022 2333   HGBUR NEGATIVE 08/08/2022 2333   BILIRUBINUR NEGATIVE 08/08/2022 2333   KETONESUR NEGATIVE 08/08/2022 2333   PROTEINUR 100 (A) 08/08/2022 2333   NITRITE NEGATIVE 08/08/2022 2333   LEUKOCYTESUR NEGATIVE 08/08/2022 2333   Sepsis Labs Recent Labs  Lab 08/11/22 0545 08/12/22 0553 08/13/22 0642 08/14/22 0410  WBC 6.4 5.9 9.2 11.1*   Microbiology Recent Results (from the past 240 hour(s))  Resp panel by RT-PCR (RSV, Flu A&B, Covid) Anterior Nasal Swab     Status: Abnormal   Collection Time: 08/08/22 11:33 PM   Specimen: Anterior Nasal Swab  Result Value Ref Range Status    SARS Coronavirus 2 by RT PCR NEGATIVE NEGATIVE Final    Comment: (NOTE) SARS-CoV-2 target nucleic acids are NOT DETECTED.  The SARS-CoV-2 RNA is generally detectable in upper respiratory specimens during the acute phase of infection. The lowest concentration of SARS-CoV-2 viral copies this assay can detect is 138 copies/mL. A negative result does not preclude SARS-Cov-2 infection and should not be used as the sole basis for treatment or other patient management decisions. A negative result may occur with  improper specimen collection/handling, submission of specimen other than nasopharyngeal swab, presence of viral mutation(s) within the areas targeted by this assay, and inadequate number of viral copies(<138 copies/mL). A negative result must be combined with clinical observations, patient history, and epidemiological information. The expected result is Negative.  Fact Sheet for Patients:  BloggerCourse.com  Fact Sheet for Healthcare Providers:  SeriousBroker.it  This test is no t yet approved or cleared by the [place] FDA and  has been authorized for detection and/or diagnosis of SARS-CoV-2 by FDA under an Emergency Use Authorization (EUA). This EUA will remain  in effect (meaning this test can be used) for the duration of the COVID-19 declaration under Section 564(b)(1) of the Act, 21 U.S.C.section 360bbb-3(b)(1), unless the authorization is terminated  or revoked sooner.       Influenza A by PCR POSITIVE (A) NEGATIVE Final   Influenza B by PCR NEGATIVE NEGATIVE Final    Comment: (NOTE) The Xpert Xpress SARS-CoV-2/FLU/RSV plus assay is intended as an aid in the diagnosis of influenza from Nasopharyngeal swab specimens and should not be used as a sole basis for treatment. Nasal washings and aspirates are unacceptable for Xpert Xpress SARS-CoV-2/FLU/RSV testing.  Fact Sheet for  Patients: BloggerCourse.com  Fact Sheet for  Healthcare Providers: SeriousBroker.it  This test is not yet approved or cleared by the [place] and has been authorized for detection and/or diagnosis of SARS-CoV-2 by FDA under an Emergency Use Authorization (EUA). This EUA will remain in effect (meaning this test can be used) for the duration of the COVID-19 declaration under Section 564(b)(1) of the Act, 21 U.S.C. section 360bbb-3(b)(1), unless the authorization is terminated or revoked.     Resp Syncytial Virus by PCR NEGATIVE NEGATIVE Final    Comment: (NOTE) Fact Sheet for Patients: BloggerCourse.com  Fact Sheet for Healthcare Providers: SeriousBroker.it  This test is not yet approved or cleared by the [place] FDA and has been authorized for detection and/or diagnosis of SARS-CoV-2 by FDA under an Emergency Use Authorization (EUA). This EUA will remain in effect (meaning this test can be used) for the duration of the COVID-19 declaration under Section 564(b)(1) of the Act, 21 U.S.C. section 360bbb-3(b)(1), unless the authorization is terminated or revoked.  Performed at [facility], [address] [place], [place] [address]   Blood Culture (routine x 2)     Status: None   Collection Time: 08/08/22 11:33 PM   Specimen: BLOOD  Result Value Ref Range Status   Specimen Description BLOOD BLOOD LEFT FOREARM  Final   Special Requests   Final    BOTTLES DRAWN AEROBIC AND ANAEROBIC Blood Culture results may not be optimal due to an inadequate volume of blood received in culture bottles   Culture   Final    NO GROWTH 5 DAYS Performed at [facility], [address] [place], [place] [address]    Report Status 08/13/2022 FINAL  Final  Blood Culture (routine x 2)     Status: None   Collection Time: 08/08/22 11:33 PM   Specimen: BLOOD  Result Value  Ref Range Status   Specimen Description BLOOD BLOOD RIGHT FOREARM  Final   Special Requests   Final    BOTTLES DRAWN AEROBIC AND ANAEROBIC Blood Culture adequate volume   Culture   Final    NO GROWTH 5 DAYS Performed at [facility], [address] [place], [place] [address]    Report Status 08/13/2022 FINAL  Final  Urine Culture     Status: None   Collection Time: 08/08/22 11:33 PM   Specimen: In/Out Cath Urine  Result Value Ref Range Status   Specimen Description   Final    IN/OUT CATH URINE Performed at [facility], [address], [place], [place] [address]    Special Requests   Final    NONE Performed at [facility], [address], [place], [place] [address]    Culture   Final    NO GROWTH Performed at [facility], [address] [place], [place] [address]    Report Status 08/10/2022 FINAL  Final     Time coordinating discharge:  I have spent 35 minutes face to face with the patient and on the ward discussing the patients care, assessment, plan and disposition with other care givers. >50% of the time was devoted counseling the patient about the risks and benefits of treatment/Discharge disposition and coordinating care.   SIGNED:   Dimple Nanas, MD  [facility] 08/14/2022, 12:44 PM   If 7PM-7AM, please contact night-coverage

## 2023-07-14 ENCOUNTER — Other Ambulatory Visit: Payer: Self-pay

## 2023-07-14 ENCOUNTER — Emergency Department
Admission: EM | Admit: 2023-07-14 | Discharge: 2023-07-14 | Disposition: A | Discharge status: Home / Self Care | Payer: No Typology Code available for payment source | Attending: Emergency Medicine | Admitting: Emergency Medicine

## 2023-07-14 ENCOUNTER — Emergency Department: Payer: No Typology Code available for payment source

## 2023-07-14 DIAGNOSIS — R748 Abnormal levels of other serum enzymes: Secondary | ICD-10-CM | POA: Diagnosis not present

## 2023-07-14 DIAGNOSIS — J45909 Unspecified asthma, uncomplicated: Secondary | ICD-10-CM | POA: Insufficient documentation

## 2023-07-14 DIAGNOSIS — J449 Chronic obstructive pulmonary disease, unspecified: Secondary | ICD-10-CM | POA: Insufficient documentation

## 2023-07-14 DIAGNOSIS — F0390 Unspecified dementia without behavioral disturbance: Secondary | ICD-10-CM | POA: Insufficient documentation

## 2023-07-14 DIAGNOSIS — M545 Low back pain, unspecified: Secondary | ICD-10-CM | POA: Diagnosis not present

## 2023-07-14 DIAGNOSIS — S50319A Abrasion of unspecified elbow, initial encounter: Secondary | ICD-10-CM | POA: Insufficient documentation

## 2023-07-14 DIAGNOSIS — R944 Abnormal results of kidney function studies: Secondary | ICD-10-CM | POA: Diagnosis not present

## 2023-07-14 DIAGNOSIS — I10 Essential (primary) hypertension: Secondary | ICD-10-CM | POA: Diagnosis not present

## 2023-07-14 DIAGNOSIS — Z23 Encounter for immunization: Secondary | ICD-10-CM | POA: Insufficient documentation

## 2023-07-14 DIAGNOSIS — W19XXXA Unspecified fall, initial encounter: Secondary | ICD-10-CM | POA: Insufficient documentation

## 2023-07-14 DIAGNOSIS — S59909A Unspecified injury of unspecified elbow, initial encounter: Secondary | ICD-10-CM | POA: Diagnosis present

## 2023-07-14 LAB — CBC WITH DIFFERENTIAL/PLATELET
Abs Immature Granulocytes: 0.07 10*3/uL (ref 0.00–0.07)
Basophils Absolute: 0 10*3/uL (ref 0.0–0.1)
Basophils Relative: 0 %
Eosinophils Absolute: 0 10*3/uL (ref 0.0–0.5)
Eosinophils Relative: 0 %
HCT: 34.6 % — ABNORMAL LOW (ref 39.0–52.0)
Hemoglobin: 11.9 g/dL — ABNORMAL LOW (ref 13.0–17.0)
Immature Granulocytes: 1 %
Lymphocytes Relative: 3 %
Lymphs Abs: 0.2 10*3/uL — ABNORMAL LOW (ref 0.7–4.0)
MCH: 39.4 pg — ABNORMAL HIGH (ref 26.0–34.0)
MCHC: 34.4 g/dL (ref 30.0–36.0)
MCV: 114.6 fL — ABNORMAL HIGH (ref 80.0–100.0)
Monocytes Absolute: 0.6 10*3/uL (ref 0.1–1.0)
Monocytes Relative: 7 %
Neutro Abs: 8.3 10*3/uL — ABNORMAL HIGH (ref 1.7–7.7)
Neutrophils Relative %: 89 %
Platelets: 408 10*3/uL — ABNORMAL HIGH (ref 150–400)
RBC: 3.02 MIL/uL — ABNORMAL LOW (ref 4.22–5.81)
RDW: 15.5 % (ref 11.5–15.5)
Smear Review: NORMAL
WBC: 9.2 10*3/uL (ref 4.0–10.5)
nRBC: 0 % (ref 0.0–0.2)

## 2023-07-14 LAB — COMPREHENSIVE METABOLIC PANEL
ALT: 23 U/L (ref 0–44)
AST: 35 U/L (ref 15–41)
Albumin: 4.6 g/dL (ref 3.5–5.0)
Alkaline Phosphatase: 62 U/L (ref 38–126)
Anion gap: 9 (ref 5–15)
BUN: 22 mg/dL (ref 8–23)
CO2: 24 mmol/L (ref 22–32)
Calcium: 9.3 mg/dL (ref 8.9–10.3)
Chloride: 103 mmol/L (ref 98–111)
Creatinine, Ser: 1.12 mg/dL (ref 0.61–1.24)
GFR, Estimated: >60 mL/min (ref >60)
Glucose, Bld: 136 mg/dL — ABNORMAL HIGH (ref 70–99)
Potassium: 4 mmol/L (ref 3.5–5.1)
Sodium: 136 mmol/L (ref 135–145)
Total Bilirubin: 1.3 mg/dL — ABNORMAL HIGH (ref <1.2)
Total Protein: 7.4 g/dL (ref 6.5–8.1)

## 2023-07-14 LAB — TROPONIN I (HIGH SENSITIVITY)
Troponin I (High Sensitivity): 14 ng/L (ref <18)
Troponin I (High Sensitivity): 16 ng/L (ref <18)

## 2023-07-14 LAB — PROTIME-INR
INR: 1.2 (ref 0.8–1.2)
Prothrombin Time: 15.1 s (ref 11.4–15.2)

## 2023-07-14 LAB — APTT: aPTT: 33 s (ref 24–36)

## 2023-07-14 LAB — CK: Total CK: 629 U/L — ABNORMAL HIGH (ref 49–397)

## 2023-07-14 MED ORDER — SODIUM CHLORIDE 0.9 % IV BOLUS: 1000.0000 mL | Freq: Once | INTRAVENOUS | Status: DC

## 2023-07-14 MED ORDER — TETANUS-DIPHTH-ACELL PERTUSSIS 5-2.5-18.5 LF-MCG/0.5 IM SUSY
0.5000 mL | PREFILLED_SYRINGE | Freq: Once | INTRAMUSCULAR | Status: AC
  Administered 2023-07-14: 0.5 mL via INTRAMUSCULAR
  Filled 2023-07-14: qty 0.5

## 2023-07-14 MED ORDER — IOHEXOL 300 MG/ML  SOLN
100.0000 mL | Freq: Once | INTRAMUSCULAR | Status: AC | PRN
Start: 2023-07-14 — End: 2023-07-14
  Administered 2023-07-14: 100 mL via INTRAVENOUS

## 2023-07-14 MED ORDER — LIDOCAINE 5 % EX PTCH
1.0000 | MEDICATED_PATCH | CUTANEOUS | Status: DC
  Administered 2023-07-14: 1 via TRANSDERMAL
  Filled 2023-07-14: qty 1

## 2023-07-14 MED ORDER — ACETAMINOPHEN 500 MG PO TABS
1000.0000 mg | ORAL_TABLET | Freq: Once | ORAL | Status: AC
  Administered 2023-07-14: 1000 mg via ORAL
  Filled 2023-07-14: qty 2

## 2023-07-14 NOTE — ED Provider Notes (Signed)
[facility] Provider Note    Event Date/Time   First MD Initiated Contact with Patient 07/14/23 1321     (approximate)   History   No chief complaint on file.   HPI  Jack Rios is a 87 y.o. male with asthma, COPD, dementia, hypertension hyperlipidemia who comes in with concerns for a fall.  Patient was found down on the ground.  Last seen at 7 PM last night.  Potentially longer given he had dried stool noted all over him.  Patient himself has dementia he denies any discomforts.  Has a little bit of an abrasion noted over his elbow.  Family is later at bedside who report that they are working on getting someone to come into the home to help with some.  He lives there with his wife.      Physical Exam   Triage Vital Signs: ED Triage Vitals  Encounter Vitals Group     BP      Systolic BP Percentile      Diastolic BP Percentile      Pulse      Resp      Temp      Temp src      SpO2      Weight      Height      Head Circumference      Peak Flow      Pain Score      Pain Loc      Pain Education      Exclude from Growth Chart     Most recent vital signs: Vitals:   07/14/23 1329 07/14/23 1330  BP:  (!) 162/83  Pulse:  91  Resp:  15  Temp: 98.4 F (36.9 C)   SpO2:  100%     General: Awake, no distress.  CV:  Good peripheral perfusion.  Resp:  Normal effort.  Abd:  Positive distention with umbilical hernia noted Other:  Low L-spine tenderness. Elbow abrasion noted Able to lift both legs up off the bed but reports pain in the lower back ED Results / Procedures / Treatments   Labs (all labs ordered are listed, but only abnormal results are displayed) Labs Reviewed  CBC WITH DIFFERENTIAL/PLATELET - Abnormal; Notable for the following components:      Result Value   RBC 3.02 (*)    Hemoglobin 11.9 (*)    HCT 34.6 (*)    MCV 114.6 (*)    MCH 39.4 (*)    Platelets 408 (*)    Neutro Abs 8.3 (*)    Lymphs Abs 0.2 (*)    All  other components within normal limits  COMPREHENSIVE METABOLIC PANEL - Abnormal; Notable for the following components:   Glucose, Bld 136 (*)    Total Bilirubin 1.3 (*)    All other components within normal limits  CK - Abnormal; Notable for the following components:   Total CK 629 (*)    All other components within normal limits  PROTIME-INR  APTT  TROPONIN I (HIGH SENSITIVITY)     EKG  My interpretation of EKG:  Sinus rhythm 97 without any ST elevation or T wave inversions, occasional PVC, QTc 43  RADIOLOGY Reviewed the x-ray personally interpreted and no fracture  PROCEDURES:  Critical Care performed: No  Procedures   MEDICATIONS ORDERED IN ED: Medications  acetaminophen (TYLENOL) tablet 1,000 mg (has no administration in time range)  lidocaine (LIDODERM) 5 % 1 patch (has no administration  in time range)  sodium chloride 0.9 % bolus 1,000 mL (has no administration in time range)  Tdap (BOOSTRIX) injection 0.5 mL (0.5 mLs Intramuscular Given 07/14/23 1344)  iohexol (OMNIPAQUE) 300 MG/ML solution 100 mL (100 mLs Intravenous Contrast Given 07/14/23 1442)     IMPRESSION / MDM / ASSESSMENT AND PLAN / ED COURSE  I reviewed the triage vital signs and the nursing notes.   Patient's presentation is most consistent with acute presentation with potential threat to life or bodily function.   Patient comes in with a fall.  Given his known dementia I did note a abdomen that was distended and some low back pain so I added on CT imaging to evaluate for intra-abdominal pathology, lumbar fractures, intracranial hemorrhage, cervical fracture.  No obvious chest wall tenderness but will get CT scan to make sure there is no other injuries there.  CK was only slightly elevated and creatinine was normal.  CBC is reassuring.  CMP reassuring initial troponin is negative.  Patient be given some fluids for his slightly elevated CK  Family is at bedside and they report they are working on  getting home health in the home.  Patient will be handed off pending CT imaging and reevaluation  The patient is on the cardiac monitor to evaluate for evidence of arrhythmia and/or significant heart rate changes.      FINAL CLINICAL IMPRESSION(S) / ED DIAGNOSES   Final diagnoses:  Fall, initial encounter     Rx / DC Orders   ED Discharge Orders     None        Note:  This document was prepared using Dragon voice recognition software and may include unintentional dictation errors.   Concha Se, MD 07/14/23 [phone]

## 2023-07-14 NOTE — ED Triage Notes (Signed)
Came from home. For a fall unknown down time. Patient was trying to get out of bed. Patient was sitting on the floor. Does not take thinners. Abrasion to the left elbow. LKW was at 1900 07/13/2023. EMS reports known diagnosis of AD. Undiagnosed cancer? Patient stating he is tired.  EMS VS: 115/72 88,  95%RA,  166CBG,  97.9 oral

## 2023-11-20 ENCOUNTER — Encounter: Payer: Self-pay | Admitting: Emergency Medicine

## 2023-11-20 ENCOUNTER — Emergency Department

## 2023-11-20 ENCOUNTER — Other Ambulatory Visit: Payer: Self-pay

## 2023-11-20 ENCOUNTER — Inpatient Hospital Stay
Admission: EM | Admit: 2023-11-20 | Discharge: 2023-11-23 | DRG: 871 | Disposition: A | Discharge status: Hospice | Attending: Internal Medicine | Admitting: Internal Medicine

## 2023-11-20 DIAGNOSIS — I10 Essential (primary) hypertension: Secondary | ICD-10-CM | POA: Diagnosis present

## 2023-11-20 DIAGNOSIS — R64 Cachexia: Secondary | ICD-10-CM | POA: Diagnosis present

## 2023-11-20 DIAGNOSIS — Z7951 Long term (current) use of inhaled steroids: Secondary | ICD-10-CM

## 2023-11-20 DIAGNOSIS — E860 Dehydration: Secondary | ICD-10-CM | POA: Diagnosis present

## 2023-11-20 DIAGNOSIS — Z1152 Encounter for screening for COVID-19: Secondary | ICD-10-CM

## 2023-11-20 DIAGNOSIS — Z79899 Other long term (current) drug therapy: Secondary | ICD-10-CM

## 2023-11-20 DIAGNOSIS — F0390 Unspecified dementia without behavioral disturbance: Secondary | ICD-10-CM | POA: Diagnosis present

## 2023-11-20 DIAGNOSIS — Z515 Encounter for palliative care: Secondary | ICD-10-CM

## 2023-11-20 DIAGNOSIS — Z9889 Other specified postprocedural states: Secondary | ICD-10-CM

## 2023-11-20 DIAGNOSIS — N170 Acute kidney failure with tubular necrosis: Secondary | ICD-10-CM | POA: Diagnosis present

## 2023-11-20 DIAGNOSIS — E785 Hyperlipidemia, unspecified: Secondary | ICD-10-CM | POA: Diagnosis present

## 2023-11-20 DIAGNOSIS — J440 Chronic obstructive pulmonary disease with acute lower respiratory infection: Secondary | ICD-10-CM | POA: Diagnosis present

## 2023-11-20 DIAGNOSIS — E870 Hyperosmolality and hypernatremia: Secondary | ICD-10-CM | POA: Diagnosis present

## 2023-11-20 DIAGNOSIS — Z6825 Body mass index (BMI) 25.0-25.9, adult: Secondary | ICD-10-CM

## 2023-11-20 DIAGNOSIS — R911 Solitary pulmonary nodule: Secondary | ICD-10-CM | POA: Diagnosis present

## 2023-11-20 DIAGNOSIS — N400 Enlarged prostate without lower urinary tract symptoms: Secondary | ICD-10-CM | POA: Diagnosis present

## 2023-11-20 DIAGNOSIS — N179 Acute kidney failure, unspecified: Secondary | ICD-10-CM

## 2023-11-20 DIAGNOSIS — Z8719 Personal history of other diseases of the digestive system: Secondary | ICD-10-CM

## 2023-11-20 DIAGNOSIS — D75839 Thrombocytosis, unspecified: Secondary | ICD-10-CM | POA: Diagnosis present

## 2023-11-20 DIAGNOSIS — M1990 Unspecified osteoarthritis, unspecified site: Secondary | ICD-10-CM | POA: Diagnosis present

## 2023-11-20 DIAGNOSIS — J189 Pneumonia, unspecified organism: Secondary | ICD-10-CM | POA: Diagnosis present

## 2023-11-20 DIAGNOSIS — R54 Age-related physical debility: Secondary | ICD-10-CM

## 2023-11-20 DIAGNOSIS — Z8711 Personal history of peptic ulcer disease: Secondary | ICD-10-CM

## 2023-11-20 DIAGNOSIS — Z91048 Other nonmedicinal substance allergy status: Secondary | ICD-10-CM

## 2023-11-20 DIAGNOSIS — Z87891 Personal history of nicotine dependence: Secondary | ICD-10-CM

## 2023-11-20 DIAGNOSIS — A419 Sepsis, unspecified organism: Principal | ICD-10-CM | POA: Diagnosis present

## 2023-11-20 DIAGNOSIS — Z66 Do not resuscitate: Secondary | ICD-10-CM | POA: Diagnosis not present

## 2023-11-20 DIAGNOSIS — J9601 Acute respiratory failure with hypoxia: Secondary | ICD-10-CM | POA: Diagnosis present

## 2023-11-20 DIAGNOSIS — G9341 Metabolic encephalopathy: Secondary | ICD-10-CM

## 2023-11-20 DIAGNOSIS — Z8261 Family history of arthritis: Secondary | ICD-10-CM

## 2023-11-20 DIAGNOSIS — Z9049 Acquired absence of other specified parts of digestive tract: Secondary | ICD-10-CM

## 2023-11-20 DIAGNOSIS — G629 Polyneuropathy, unspecified: Secondary | ICD-10-CM | POA: Diagnosis present

## 2023-11-20 DIAGNOSIS — Z7982 Long term (current) use of aspirin: Secondary | ICD-10-CM

## 2023-11-20 DIAGNOSIS — R638 Other symptoms and signs concerning food and fluid intake: Secondary | ICD-10-CM

## 2023-11-20 DIAGNOSIS — E872 Acidosis, unspecified: Secondary | ICD-10-CM | POA: Diagnosis present

## 2023-11-20 DIAGNOSIS — R6520 Severe sepsis without septic shock: Secondary | ICD-10-CM | POA: Diagnosis present

## 2023-11-20 DIAGNOSIS — Z7189 Other specified counseling: Secondary | ICD-10-CM

## 2023-11-20 LAB — CBC WITH DIFFERENTIAL/PLATELET
Abs Immature Granulocytes: 0.27 10*3/uL — ABNORMAL HIGH (ref 0.00–0.07)
Basophils Absolute: 0.1 10*3/uL (ref 0.0–0.1)
Basophils Relative: 0 %
Eosinophils Absolute: 0.1 10*3/uL (ref 0.0–0.5)
Eosinophils Relative: 0 %
HCT: 32.8 % — ABNORMAL LOW (ref 39.0–52.0)
Hemoglobin: 10.1 g/dL — ABNORMAL LOW (ref 13.0–17.0)
Immature Granulocytes: 1 %
Lymphocytes Relative: 3 %
Lymphs Abs: 0.7 10*3/uL (ref 0.7–4.0)
MCH: 34.2 pg — ABNORMAL HIGH (ref 26.0–34.0)
MCHC: 30.8 g/dL (ref 30.0–36.0)
MCV: 111.2 fL — ABNORMAL HIGH (ref 80.0–100.0)
Monocytes Absolute: 1.6 10*3/uL — ABNORMAL HIGH (ref 0.1–1.0)
Monocytes Relative: 7 %
Neutro Abs: 20.7 10*3/uL — ABNORMAL HIGH (ref 1.7–7.7)
Neutrophils Relative %: 89 %
Platelets: 1551 10*3/uL (ref 150–400)
RBC: 2.95 MIL/uL — ABNORMAL LOW (ref 4.22–5.81)
RDW: 15.9 % — ABNORMAL HIGH (ref 11.5–15.5)
Smear Review: NORMAL
WBC: 23.5 10*3/uL — ABNORMAL HIGH (ref 4.0–10.5)
nRBC: 0.3 % — ABNORMAL HIGH (ref 0.0–0.2)

## 2023-11-20 LAB — RESP PANEL BY RT-PCR (RSV, FLU A&B, COVID)  RVPGX2
Influenza A by PCR: NEGATIVE
Influenza B by PCR: NEGATIVE
Resp Syncytial Virus by PCR: NEGATIVE
SARS Coronavirus 2 by RT PCR: NEGATIVE

## 2023-11-20 LAB — COMPREHENSIVE METABOLIC PANEL WITH GFR
ALT: 17 U/L (ref 0–44)
AST: 28 U/L (ref 15–41)
Albumin: 3.6 g/dL (ref 3.5–5.0)
Alkaline Phosphatase: 67 U/L (ref 38–126)
Anion gap: 13 (ref 5–15)
BUN: 39 mg/dL — ABNORMAL HIGH (ref 8–23)
CO2: 21 mmol/L — ABNORMAL LOW (ref 22–32)
Calcium: 9.4 mg/dL (ref 8.9–10.3)
Chloride: 121 mmol/L — ABNORMAL HIGH (ref 98–111)
Creatinine, Ser: 1.69 mg/dL — ABNORMAL HIGH (ref 0.61–1.24)
GFR, Estimated: 39 mL/min — ABNORMAL LOW (ref >60)
Glucose, Bld: 133 mg/dL — ABNORMAL HIGH (ref 70–99)
Potassium: 4.2 mmol/L (ref 3.5–5.1)
Sodium: 155 mmol/L — ABNORMAL HIGH (ref 135–145)
Total Bilirubin: 1.4 mg/dL — ABNORMAL HIGH (ref 0.0–1.2)
Total Protein: 7.4 g/dL (ref 6.5–8.1)

## 2023-11-20 LAB — BRAIN NATRIURETIC PEPTIDE: B Natriuretic Peptide: 41.1 pg/mL (ref 0.0–100.0)

## 2023-11-20 LAB — PROTIME-INR
INR: 1.5 — ABNORMAL HIGH (ref 0.8–1.2)
Prothrombin Time: 18 s — ABNORMAL HIGH (ref 11.4–15.2)

## 2023-11-20 LAB — LACTIC ACID, PLASMA
Lactic Acid, Venous: 3.2 mmol/L (ref 0.5–1.9)
Lactic Acid, Venous: 1.9 mmol/L (ref 0.5–1.9)

## 2023-11-20 MED ORDER — SODIUM CHLORIDE 0.9 % IV SOLN
2.0000 g | INTRAVENOUS | Status: DC
  Administered 2023-11-21: 2 g via INTRAVENOUS
  Filled 2023-11-20 (×2): qty 20

## 2023-11-20 MED ORDER — ALBUTEROL SULFATE (2.5 MG/3ML) 0.083% IN NEBU: 2.5000 mg | INHALATION_SOLUTION | RESPIRATORY_TRACT | Status: DC | PRN

## 2023-11-20 MED ORDER — SODIUM CHLORIDE 0.9 % IV SOLN
500.0000 mg | INTRAVENOUS | Status: DC
  Administered 2023-11-21: 500 mg via INTRAVENOUS
  Filled 2023-11-20 (×2): qty 5

## 2023-11-20 MED ORDER — ONDANSETRON HCL 4 MG PO TABS: 4.0000 mg | ORAL_TABLET | Freq: Four times a day (QID) | ORAL | Status: DC | PRN

## 2023-11-20 MED ORDER — CEFTRIAXONE SODIUM 2 G IJ SOLR
2.0000 g | Freq: Once | INTRAMUSCULAR | Status: AC
  Administered 2023-11-20: 2 g via INTRAVENOUS
  Filled 2023-11-20: qty 20

## 2023-11-20 MED ORDER — ENOXAPARIN SODIUM 40 MG/0.4ML IJ SOSY
40.0000 mg | PREFILLED_SYRINGE | INTRAMUSCULAR | Status: DC
Start: 2023-11-20 — End: 2023-11-22
  Administered 2023-11-20 – 2023-11-21 (×2): 40 mg via SUBCUTANEOUS
  Filled 2023-11-20 (×2): qty 0.4

## 2023-11-20 MED ORDER — ACETAMINOPHEN 650 MG RE SUPP: 650.0000 mg | Freq: Four times a day (QID) | RECTAL | Status: DC | PRN

## 2023-11-20 MED ORDER — LACTATED RINGERS IV BOLUS (SEPSIS)
1000.0000 mL | Freq: Once | INTRAVENOUS | Status: AC
  Administered 2023-11-20: 1000 mL via INTRAVENOUS

## 2023-11-20 MED ORDER — ONDANSETRON HCL 4 MG/2ML IJ SOLN: 4.0000 mg | Freq: Four times a day (QID) | INTRAMUSCULAR | Status: DC | PRN

## 2023-11-20 MED ORDER — SODIUM CHLORIDE 0.9 % IV SOLN
500.0000 mg | Freq: Once | INTRAVENOUS | Status: AC
  Administered 2023-11-20: 500 mg via INTRAVENOUS
  Filled 2023-11-20: qty 5

## 2023-11-20 MED ORDER — ACETAMINOPHEN 325 MG PO TABS: 650.0000 mg | ORAL_TABLET | Freq: Four times a day (QID) | ORAL | Status: DC | PRN

## 2023-11-20 MED ORDER — LACTATED RINGERS IV SOLN: INTRAVENOUS | Status: DC

## 2023-11-20 MED ORDER — DEXTROSE 5 % IV SOLN: INTRAVENOUS | Status: AC

## 2023-11-20 NOTE — ED Provider Notes (Addendum)
 [facility] Provider Note    Event Date/Time   First MD Initiated Contact with Patient 11/20/23 1627     (approximate)   History   Code Sepsis   HPI Jack Rios is a 88 y.o. male with history of dementia, HTN, HLD presenting today for shortness of breath.  EMS was called for patient being weak at home with reported shortness of breath.  Daughter at bedside states he is mostly at his baseline mental status although it is slightly worse but he goes phases like this.  He was initially found to be hypoxic and placed on nonrebreather.  They note some cough and congestion recently but otherwise no nausea, vomiting, diarrhea, dysuria that they are aware of.  Patient is not able to contribute to any history.     Physical Exam   Triage Vital Signs: ED Triage Vitals  Encounter Vitals Group     BP 11/20/23 1630 97/65     Systolic BP Percentile --      Diastolic BP Percentile --      Pulse Rate 11/20/23 1700 87     Resp 11/20/23 1630 18     Temp 11/20/23 1730 98.4 F (36.9 C)     Temp Source 11/20/23 1730 Axillary     SpO2 11/20/23 1700 100 %     Weight 11/20/23 1635 180 lb (81.6 kg)     Height 11/20/23 1635 5\' 10"  (1.778 m)     Head Circumference --      Peak Flow --      Pain Score --      Pain Loc --      Pain Education --      Exclude from Growth Chart --     Most recent vital signs: Vitals:   11/20/23 1730 11/20/23 1800  BP: 135/76   Pulse: 91 92  Resp: 17 20  Temp: 98.4 F (36.9 C)   SpO2: 99% 99%   I have reviewed the vital signs. General:  Awake, minimally responsive which is not too far off his baseline. Head:  Normocephalic, Atraumatic. EENT:  PERRL, EOMI, Oral mucosa pink and moist, Neck is supple. Cardiovascular: Tachycardic rate, 2+ distal pulses. Respiratory: Slight tachypnea but no significant accessory muscle usage.  Scattered crackles throughout. Extremities:  Moving all four extremities through full ROM without pain.    Neuro:  Alert but not oriented consistent with his baseline.    Skin:  Warm, dry, no rash.   Psych: Appropriate affect.    ED Results / Procedures / Treatments   Labs (all labs ordered are listed, but only abnormal results are displayed) Labs Reviewed  LACTIC ACID, PLASMA - Abnormal; Notable for the following components:      Result Value   Lactic Acid, Venous 3.2 (*)    All other components within normal limits  COMPREHENSIVE METABOLIC PANEL WITH GFR - Abnormal; Notable for the following components:   Sodium 155 (*)    Chloride 121 (*)    CO2 21 (*)    Glucose, Bld 133 (*)    BUN 39 (*)    Creatinine, Ser 1.69 (*)    Total Bilirubin 1.4 (*)    GFR, Estimated 39 (*)    All other components within normal limits  CBC WITH DIFFERENTIAL/PLATELET - Abnormal; Notable for the following components:   WBC 23.5 (*)    RBC 2.95 (*)    Hemoglobin 10.1 (*)    HCT 32.8 (*)  MCV 111.2 (*)    MCH 34.2 (*)    RDW 15.9 (*)    Platelets 1,551 (*)    nRBC 0.3 (*)    Neutro Abs 20.7 (*)    Monocytes Absolute 1.6 (*)    Abs Immature Granulocytes 0.27 (*)    All other components within normal limits  PROTIME-INR - Abnormal; Notable for the following components:   Prothrombin Time 18.0 (*)    INR 1.5 (*)    All other components within normal limits  RESP PANEL BY RT-PCR (RSV, FLU A&B, COVID)  [id number]  CULTURE, BLOOD (ROUTINE X 2)  CULTURE, BLOOD (ROUTINE X 2)  BRAIN NATRIURETIC PEPTIDE  LACTIC ACID, PLASMA  URINALYSIS, W/ REFLEX TO CULTURE (INFECTION SUSPECTED)     EKG My EKG interpretation: Rate of 93, left axis deviation.  No acute ST elevations or depressions   RADIOLOGY Independently interpreted chest x-ray with evidence of bilateral pneumonia   PROCEDURES:  Critical Care performed: Yes, see critical care procedure note(s)  .Critical Care  Performed by: Janith Lima, MD Authorized by: Janith Lima, MD   Critical care provider statement:    Critical care time  (minutes):  30   Critical care was necessary to treat or prevent imminent or life-threatening deterioration of the following conditions:  Respiratory failure and sepsis   Critical care was time spent personally by me on the following activities:  Development of treatment plan with patient or surrogate, discussions with consultants, evaluation of patient's response to treatment, examination of patient, ordering and review of laboratory studies, ordering and review of radiographic studies, ordering and performing treatments and interventions, pulse oximetry, re-evaluation of patient's condition and review of old charts   I assumed direction of critical care for this patient from another provider in my specialty: no     Care discussed with: admitting provider      MEDICATIONS ORDERED IN ED: Medications  lactated ringers infusion (has no administration in time range)  azithromycin (ZITHROMAX) 500 mg in sodium chloride 0.9 % 250 mL IVPB (500 mg Intravenous New Bag/Given 11/20/23 1721)  lactated ringers bolus 1,000 mL (1,000 mLs Intravenous New Bag/Given 11/20/23 1650)  cefTRIAXone (ROCEPHIN) 2 g in sodium chloride 0.9 % 100 mL IVPB (0 g Intravenous Stopped 11/20/23 1738)     IMPRESSION / MDM / ASSESSMENT AND PLAN / ED COURSE  I reviewed the triage vital signs and the nursing notes.                              Differential diagnosis includes, but is not limited to, \pneumonia, COVID, flu, RSV, pneumothorax, sepsis, dehydration, electrolyte abnormality  Patient's presentation is most consistent with acute presentation with potential threat to life or bodily function.  Patient is an 88 year old male presenting today for sepsis concerning for possible respiratory standpoint.  Initial tachypnea with hypotension and tachycardia.  Blood cultures ordered and started on community-acquired pneumonia treatment with ceftriaxone and azithromycin.  White blood cell count elevated at 23.5.  Also noted to have  significant thrombocytosis.  Titrated off nonrebreather to 5 L nasal cannula.  Lactic acid 3.2.  Hyponatremia 155 as well.  Chest x-ray show evidence of multifocal pneumonia.  Will admit to hospitalist for sepsis secondary to multifocal pneumonia along with hyponatremia.  Discussed with daughter at bedside who states patient's wife who is his power of attorney still wants him to be full code.  He has been off his  hydroxyurea for the past 2 months which explains his thrombocytosis.  The patient is on the cardiac monitor to evaluate for evidence of arrhythmia and/or significant heart rate changes. Clinical Course as of 11/20/23 1809  Sat Nov 20, 2023  1709 Sodium(!): 155 [DW]  1709 Creatinine(!): 1.69 [DW]    Clinical Course User Index [DW] Kandee Orion, MD     FINAL CLINICAL IMPRESSION(S) / ED DIAGNOSES   Final diagnoses:  Sepsis, due to unspecified organism, unspecified whether acute organ dysfunction present [facility])  Multifocal pneumonia  Hypernatremia     Rx / DC Orders   ED Discharge Orders     None        Note:  This document was prepared using Dragon voice recognition software and may include unintentional dictation errors.   Kandee Orion, MD 11/20/23 Staci Dykes    Kandee Orion, MD 11/20/23 [phone]

## 2023-11-20 NOTE — IPAL (Signed)
  Interdisciplinary Goals of Care Family Meeting   Date carried out: 11/20/2023  Location of the meeting: Phone conference  Member's involved: Physician and Family Member or next of kin  Durable Power of Attorney or acting medical decision maker: Daughter Jack Rios  Discussion: We discussed goals of care for Jack Rios .   I have reviewed medical records including EPIC notes, labs and imaging, assessed the patient and then met with daughter to discuss major active diagnoses, plan of care, natural trajectory, prognosis, GOC, EOL wishes, disposition and options including Full code/DNI/DNR and the concept of comfort care if DNR is elected. Questions and concerns were addressed. They are  in agreement to continue current plan of care .    Spoke extensively with daughter who is in healthcare.  When she initially spoke with her mom they had decided DNR/DNI however a follow-up conversation clarified that patient's wife does want patient to be a full code at this time. Daughter feels differently but will defer to her mother, patient's spouse's wishes for full code status   Code status:   Code Status: Full Code   Disposition: Continue current acute care  Time spent for the meeting: 35    Lanetta Pion, MD  11/20/2023, 9:21 PM

## 2023-11-20 NOTE — Assessment & Plan Note (Signed)
 Frailty/physical deconditioning Consider palliative care consult Per ED provider, patient's daughters are deferring to patient's wife who currently wants him to be a full code Holding all meds pending speech therapy eval

## 2023-11-20 NOTE — Assessment & Plan Note (Signed)
 Lethargy, AMS related to sepsis with a background of advanced dementia Keeping n.p.o. tonight Aspiration precautions, neurologic checks

## 2023-11-20 NOTE — ED Notes (Signed)
 ED TO INPATIENT HANDOFF REPORT  ED Nurse Name and Phone #: [phone]  S Name/Age/Gender Jack Rios 88 y.o. male Room/Bed: ED31A/ED31A  Code Status   Code Status: Full Code  Home/SNF/Other Home Patient oriented to: self Is this baseline? Yes   Triage Complete: Triage complete  Chief Complaint Sepsis due to pneumonia (HCC) [J18.9, A41.9]  Triage Note Pt via [facility] from home. Pt c/o generalized weakness and respiratory distress. EMS reports initial sat of 77% on RA and increase to 86% on 15L NRB. EMS report crackles on the R side. On arrival, pt is alert able to look when name is called but not able to answer many questions. Pt has a hx of dementia and leans to the R side.  85/56 BP  106 HR  97.0 temp  232 CBG    Allergies Allergies  Allergen Reactions   Molds & Smuts     Level of Care/Admitting Diagnosis ED Disposition     ED Disposition  Admit   Condition  --   Comment  Hospital Area: [facility] [[id number]]  Level of Care: Telemetry Medical [104]  Covid Evaluation: Confirmed COVID Negative  Diagnosis: Sepsis due to pneumonia [facility]) [[id number]]  Admitting Physician: Lanetta Pion [[id number]]  Attending Physician: Lanetta Pion [[id number]]          B Medical/Surgery History Past Medical History:  Diagnosis Date   Arthritis    Asthma    Biliary sludge determined by ultrasound    Calculus of bile duct with acute cholangitis with obstruction    Calculus of gallbladder and bile duct without cholecystitis, with obstruction    Calculus of gallbladder without cholecystitis without obstruction    Choledocholithiasis    Common bile duct dilatation    Fever    Jaundice    Lung nodule    Neuropathy    Peptic ulcer    Pneumonia 09/26/2015   Protein-calorie malnutrition, severe 09/26/2015   Stenosis of duodenum    Transaminitis 05/01/2017   Past Surgical History:  Procedure Laterality Date   CERVICAL SPINE SURGERY     CHOLECYSTECTOMY N/A  05/05/2017   Procedure: LAPAROSCOPIC CHOLECYSTECTOMY WITH INTRAOPERATIVE CHOLANGIOGRAM;  Surgeon: Gwyndolyn Lerner, MD;  Location: [facility] ORS;  Service: General;  Laterality: N/A;   ENDOSCOPIC RETROGRADE CHOLANGIOPANCREATOGRAPHY (ERCP) WITH PROPOFOL N/A 05/03/2017   Procedure: ENDOSCOPIC RETROGRADE CHOLANGIOPANCREATOGRAPHY (ERCP) WITH PROPOFOL;  Surgeon: Marnee Sink, MD;  Location: [facility] ENDOSCOPY;  Service: Endoscopy;  Laterality: N/A;     A IV Location/Drains/Wounds Patient Lines/Drains/Airways Status     Active Line/Drains/Airways     Name Placement date Placement time Site Days   Peripheral IV 11/20/23 20 G Right Antecubital 11/20/23  --  Antecubital  less than 1   Peripheral IV 11/20/23 20 G Anterior;Distal;Left Forearm 11/20/23  2004  Forearm  less than 1            Intake/Output Last 24 hours No intake or output data in the 24 hours ending 11/20/23 2220  Labs/Imaging Results for orders placed or performed during the hospital encounter of 11/20/23 (from the past 48 hours)  Lactic acid, plasma     Status: Abnormal   Collection Time: 11/20/23  4:29 PM  Result Value Ref Range   Lactic Acid, Venous 3.2 (HH) 0.5 - 1.9 mmol/L    Comment: CRITICAL RESULT CALLED TO, READ BACK BY AND VERIFIED WITH LISA GEISLER @1704  11/20/23 MJU Performed at [facility], [address], [place], [place] [address]   Comprehensive  metabolic panel     Status: Abnormal   Collection Time: 11/20/23  4:29 PM  Result Value Ref Range   Sodium 155 (H) 135 - 145 mmol/L   Potassium 4.2 3.5 - 5.1 mmol/L    Comment: HEMOLYSIS AT THIS LEVEL MAY AFFECT RESULT   Chloride 121 (H) 98 - 111 mmol/L   CO2 21 (L) 22 - 32 mmol/L   Glucose, Bld 133 (H) 70 - 99 mg/dL    Comment: Glucose reference range applies only to samples taken after fasting for at least 8 hours.   BUN 39 (H) 8 - 23 mg/dL   Creatinine, Ser 8.11 (H) 0.61 - 1.24 mg/dL   Calcium 9.4 8.9 - 91.4 mg/dL   Total Protein 7.4 6.5 - 8.1 g/dL    Albumin 3.6 3.5 - 5.0 g/dL   AST 28 15 - 41 U/L    Comment: HEMOLYSIS AT THIS LEVEL MAY AFFECT RESULT   ALT 17 0 - 44 U/L    Comment: HEMOLYSIS AT THIS LEVEL MAY AFFECT RESULT   Alkaline Phosphatase 67 38 - 126 U/L   Total Bilirubin 1.4 (H) 0.0 - 1.2 mg/dL    Comment: HEMOLYSIS AT THIS LEVEL MAY AFFECT RESULT   GFR, Estimated 39 (L) >60 mL/min    Comment: (NOTE) Calculated using the CKD-EPI Creatinine Equation (2021)    Anion gap 13 5 - 15    Comment: Performed at [facility], [address] [place], [place] [address]  CBC with Differential     Status: Abnormal   Collection Time: 11/20/23  4:29 PM  Result Value Ref Range   WBC 23.5 (H) 4.0 - 10.5 K/uL   RBC 2.95 (L) 4.22 - 5.81 MIL/uL   Hemoglobin 10.1 (L) 13.0 - 17.0 g/dL   HCT 62.1 (L) 30.8 - 65.7 %   MCV 111.2 (H) 80.0 - 100.0 fL   MCH 34.2 (H) 26.0 - 34.0 pg   MCHC 30.8 30.0 - 36.0 g/dL   RDW 84.6 (H) 96.2 - 95.2 %   Platelets 1,551 (HH) 150 - 400 K/uL    Comment: REPEATED TO VERIFY THIS CRITICAL RESULT HAS VERIFIED AND BEEN CALLED TO LISA GEISLER BY DYSON,QUANISHA ON 04 12 2025 AT 1655, AND HAS BEEN READ BACK.     nRBC 0.3 (H) 0.0 - 0.2 %   Neutrophils Relative % 89 %   Neutro Abs 20.7 (H) 1.7 - 7.7 K/uL   Lymphocytes Relative 3 %   Lymphs Abs 0.7 0.7 - 4.0 K/uL   Monocytes Relative 7 %   Monocytes Absolute 1.6 (H) 0.1 - 1.0 K/uL   Eosinophils Relative 0 %   Eosinophils Absolute 0.1 0.0 - 0.5 K/uL   Basophils Relative 0 %   Basophils Absolute 0.1 0.0 - 0.1 K/uL   WBC Morphology MORPHOLOGY UNREMARKABLE    Smear Review Normal platelet morphology     Comment: PLATELETS APPEAR INCREASED   Immature Granulocytes 1 %   Abs Immature Granulocytes 0.27 (H) 0.00 - 0.07 K/uL   Polychromasia PRESENT     Comment: Performed at [facility], [address], [place], [place] [address]  Protime-INR     Status: Abnormal   Collection Time: 11/20/23  4:29 PM  Result Value Ref Range   Prothrombin Time 18.0 (H)  11.4 - 15.2 seconds   INR 1.5 (H) 0.8 - 1.2    Comment: (NOTE) INR goal varies based on device and disease states. Performed at [facility], [address]  [place], [place] [address]   Brain natriuretic peptide     Status: None   Collection Time: 11/20/23  4:29 PM  Result Value Ref Range   B Natriuretic Peptide 41.1 0.0 - 100.0 pg/mL    Comment: Performed at [facility], [address] [place], [place] [address]  Resp panel by RT-PCR (RSV, Flu A&B, Covid) Anterior Nasal Swab     Status: None   Collection Time: 11/20/23  5:30 PM   Specimen: Anterior Nasal Swab  Result Value Ref Range   SARS Coronavirus 2 by RT PCR NEGATIVE NEGATIVE    Comment: (NOTE) SARS-CoV-2 target nucleic acids are NOT DETECTED.  The SARS-CoV-2 RNA is generally detectable in upper respiratory specimens during the acute phase of infection. The lowest concentration of SARS-CoV-2 viral copies this assay can detect is 138 copies/mL. A negative result does not preclude SARS-Cov-2 infection and should not be used as the sole basis for treatment or other patient management decisions. A negative result may occur with  improper specimen collection/handling, submission of specimen other than nasopharyngeal swab, presence of viral mutation(s) within the areas targeted by this assay, and inadequate number of viral copies(<138 copies/mL). A negative result must be combined with clinical observations, patient history, and epidemiological information. The expected result is Negative.  Fact Sheet for Patients:  BloggerCourse.com  Fact Sheet for Healthcare Providers:  SeriousBroker.it  This test is no t yet approved or cleared by the [place]  FDA and  has been authorized for detection and/or diagnosis of SARS-CoV-2 by FDA under an Emergency Use Authorization (EUA). This EUA will remain  in effect (meaning this test can be used) for the duration  of the COVID-19 declaration under Section 564(b)(1) of the Act, 21 U.S.C.section 360bbb-3(b)(1), unless the authorization is terminated  or revoked sooner.       Influenza A by PCR NEGATIVE NEGATIVE   Influenza B by PCR NEGATIVE NEGATIVE    Comment: (NOTE) The Xpert Xpress SARS-CoV-2/FLU/RSV plus assay is intended as an aid in the diagnosis of influenza from Nasopharyngeal swab specimens and should not be used as a sole basis for treatment. Nasal washings and aspirates are unacceptable for Xpert Xpress SARS-CoV-2/FLU/RSV testing.  Fact Sheet for Patients: BloggerCourse.com  Fact Sheet for Healthcare Providers: SeriousBroker.it  This test is not yet approved or cleared by the [place]  FDA and has been authorized for detection and/or diagnosis of SARS-CoV-2 by FDA under an Emergency Use Authorization (EUA). This EUA will remain in effect (meaning this test can be used) for the duration of the COVID-19 declaration under Section 564(b)(1) of the Act, 21 U.S.C. section 360bbb-3(b)(1), unless the authorization is terminated or revoked.     Resp Syncytial Virus by PCR NEGATIVE NEGATIVE    Comment: (NOTE) Fact Sheet for Patients: BloggerCourse.com  Fact Sheet for Healthcare Providers: SeriousBroker.it  This test is not yet approved or cleared by the [place]  FDA and has been authorized for detection and/or diagnosis of SARS-CoV-2 by FDA under an Emergency Use Authorization (EUA). This EUA will remain in effect (meaning this test can be used) for the duration of the COVID-19 declaration under Section 564(b)(1) of the Act, 21 U.S.C. section 360bbb-3(b)(1), unless the authorization is terminated or revoked.  Performed at [facility], [address] [place], [place] [address]   Lactic acid, plasma     Status: None   Collection Time: 11/20/23  8:41 PM  Result  Value Ref Range   Lactic Acid, Venous 1.9 0.5 - 1.9  mmol/L    Comment: Performed at [facility], [address] [place], [place] [address]   DG Chest [place] 1 View Result Date: 11/20/2023 CLINICAL DATA:  Questionable sepsis EXAM: PORTABLE CHEST 1 VIEW COMPARISON:  Chest x-ray 01/11/2023 FINDINGS: There are patchy airspace opacities in the right lung base. There are minimal patchy airspace opacities in the bilateral lung bases. There is no pleural effusion or pneumothorax. The cardiomediastinal silhouette is within normal limits. No acute fractures are seen. There are degenerative changes of the left shoulder. IMPRESSION: Patchy airspace opacities in the right lung base and minimal patchy airspace opacities in the bilateral lung bases, concerning for pneumonia. Electronically Signed   By: Tyron Gallon M.D.   On: 11/20/2023 17:13    Pending Labs Unresulted Labs (From admission, onward)     Start     Ordered   11/27/23 0500  Creatinine, serum  (enoxaparin (LOVENOX)    CrCl >/= 30 ml/min)  Weekly,   TIMED     Comments: while on enoxaparin therapy    11/20/23 2108   11/21/23 0500  Protime-INR  Tomorrow morning,   R        11/20/23 2108   11/21/23 0500  Basic metabolic panel  Tomorrow morning,   R        11/20/23 2108   11/21/23 0500  CBC  Tomorrow morning,   R        11/20/23 2108   11/20/23 1632  Blood Culture (routine x 2)  (Septic presentation on arrival (screening labs, nursing and treatment orders for obvious sepsis))  BLOOD CULTURE X 2,   STAT      11/20/23 1631   11/20/23 1632  Urinalysis, w/ Reflex to Culture (Infection Suspected) -Urine, Catheterized  (Septic presentation on arrival (screening labs, nursing and treatment orders for obvious sepsis))  ONCE - URGENT,   URGENT       Question:  Specimen Source  Answer:  Urine, Catheterized   11/20/23 1631            Vitals/Pain Today's Vitals   11/20/23 1730 11/20/23 1800 11/20/23 1930 11/20/23 2030  BP: 135/76  (!)  100/58 126/71  Pulse: 91 92 88 90  Resp: 17 20 18  (!) 28  Temp: 98.4 F (36.9 C)     TempSrc: Axillary     SpO2: 99% 99% 94% 94%  Weight:      Height:        Isolation Precautions No active isolations  Medications Medications  lactated ringers infusion ( Intravenous New Bag/Given 11/20/23 2001)  enoxaparin (LOVENOX) injection 40 mg (40 mg Subcutaneous Given 11/20/23 2113)  cefTRIAXone (ROCEPHIN) 2 g in sodium chloride 0.9 % 100 mL IVPB (has no administration in time range)  azithromycin (ZITHROMAX) 500 mg in sodium chloride 0.9 % 250 mL IVPB (has no administration in time range)  acetaminophen (TYLENOL) tablet 650 mg (has no administration in time range)    Or  acetaminophen (TYLENOL) suppository 650 mg (has no administration in time range)  ondansetron (ZOFRAN) tablet 4 mg (has no administration in time range)    Or  ondansetron (ZOFRAN) injection 4 mg (has no administration in time range)  albuterol (PROVENTIL) (2.5 MG/3ML) 0.083% nebulizer solution 2.5 mg (has no administration in time range)  dextrose 5 % solution ( Intravenous New Bag/Given 11/20/23 2122)  lactated ringers bolus 1,000 mL (0 mLs Intravenous Stopped 11/20/23 1902)  cefTRIAXone (ROCEPHIN) 2 g in sodium chloride 0.9 % 100 mL IVPB (0 g  Intravenous Stopped 11/20/23 1738)  azithromycin (ZITHROMAX) 500 mg in sodium chloride 0.9 % 250 mL IVPB (0 mg Intravenous Stopped 11/20/23 1901)    Mobility walks with device     Focused Assessments Pulmonary Assessment Handoff:  Lung sounds:   O2 Device: Nasal Cannula O2 Flow Rate (L/min): 5 L/min    R Recommendations: See Admitting Provider Note  Report given to:   Additional Notes: daughter at bedside

## 2023-11-20 NOTE — Assessment & Plan Note (Addendum)
 Hypernatremia Inadequate oral intake  AKI and hypernatremia likely related to ATN from dehydration from poor oral intake as well as hypotension related to sepsis Continue IV fluid resuscitation with LR Monitor sodium-maintenance with D5 to help bring down sodium Monitor renal function and avoid nephrotoxins Keeping n.p.o. Dietary consult

## 2023-11-20 NOTE — ED Triage Notes (Signed)
 Pt via [facility] from home. Pt c/o generalized weakness and respiratory distress. EMS reports initial sat of 77% on RA and increase to 86% on 15L NRB. EMS report crackles on the R side. On arrival, pt is alert able to look when name is called but not able to answer many questions. Pt has a hx of dementia and leans to the R side.  85/56 BP  106 HR  97.0 temp  232 CBG

## 2023-11-20 NOTE — Assessment & Plan Note (Signed)
 Holding meds tonight

## 2023-11-20 NOTE — Assessment & Plan Note (Addendum)
 Suspect severe sepsis Acute respiratory failure with hypoxia Sepsis criteria include tachycardia ,hypotension and hypoxia to 77% with EMS, tachypnea, leukocytosis with lactic acidosis, AMS and AKI Sepsis fluids Rocephin and azithromycin Will keep n.p.o. pending speech eval for aspiration risk Albuterol as needed Continue supplemental oxygen and wean as tolerated

## 2023-11-20 NOTE — ED Notes (Signed)
 Karlynn Oyster, MD, made aware of critical platelet count of 1,551

## 2023-11-20 NOTE — Consult Note (Signed)
 CODE SEPSIS - PHARMACY COMMUNICATION  **Broad Spectrum Antibiotics should be administered within 1 hour of Sepsis diagnosis**  Time Code Sepsis Called/Page Received: 1629  Antibiotics Ordered: azithromycin and ceftriaxone  Time of 1st antibiotic administration: 1652  Additional action taken by pharmacy: none  If necessary, Name of Provider/Nurse Contacted: n/a   Jaccob Czaplicki Rodriguez-Guzman PharmD, BCPS 11/20/2023 4:35 PM

## 2023-11-20 NOTE — H&P (Signed)
 History and Physical    Patient: Jack Rios WGN:[id number] DOB: [birth year] DOA: 11/20/2023 DOS: the patient was seen and examined on 11/20/2023 PCP: [facility]  Patient coming from: Home  Chief Complaint:  Chief Complaint  Patient presents with   Code Sepsis    HPI: Jack Rios is a 88 y.o. male with medical history significant for COPD, dementia, BPH, HTN, thrombocytosis previously on hydroxyurea, being admitted with  pneumonia after presenting with a several day history of weakness and lethargy, shortness of breath, decreased oral intake, in the setting of chronic decline related to advanced dementia.  Patient unable to contribute to history.  O2 sat on arrival of EMS was 77% and patient was placed on NRB at 15 L.  With EMS he was tachycardic to 106 and BP was 85/56. ED course and data review: Mild tachypnea but with otherwise normal vitals Blood work notable for WBC 23,000 and lactic acid 3.2.  Respiratory viral panel negative. BNP 41.1 Platelets 1551000, hemoglobin 10.1 Sodium 155, creatinine 1.69 up from baseline of 1.1 Total bili 1.4 with otherwise normal LFTs EKG personally reviewed and interpreted showing sinus at 93 with nonspecific ST-T wave changes Chest x-ray showing bilateral patchy opacities at bases concerning for pneumonia Patient started on Rocephin and azithromycin, given an LR bolus Hospitalist consulted for admission.     Past Medical History:  Diagnosis Date   Arthritis    Asthma    Biliary sludge determined by ultrasound    Calculus of bile duct with acute cholangitis with obstruction    Calculus of gallbladder and bile duct without cholecystitis, with obstruction    Calculus of gallbladder without cholecystitis without obstruction    Choledocholithiasis    Common bile duct dilatation    Fever    Jaundice    Lung nodule    Neuropathy    Peptic ulcer    Pneumonia 09/26/2015   Protein-calorie malnutrition, severe 09/26/2015   Stenosis  of duodenum    Transaminitis 05/01/2017   Past Surgical History:  Procedure Laterality Date   CERVICAL SPINE SURGERY     CHOLECYSTECTOMY N/A 05/05/2017   Procedure: LAPAROSCOPIC CHOLECYSTECTOMY WITH INTRAOPERATIVE CHOLANGIOGRAM;  Surgeon: Gwyndolyn Lerner, MD;  Location: [facility] ORS;  Service: General;  Laterality: N/A;   ENDOSCOPIC RETROGRADE CHOLANGIOPANCREATOGRAPHY (ERCP) WITH PROPOFOL N/A 05/03/2017   Procedure: ENDOSCOPIC RETROGRADE CHOLANGIOPANCREATOGRAPHY (ERCP) WITH PROPOFOL;  Surgeon: Marnee Sink, MD;  Location: [facility] ENDOSCOPY;  Service: Endoscopy;  Laterality: N/A;   Social History:  reports that he has quit smoking. His smoking use included cigarettes. He has never used smokeless tobacco. He reports that he does not drink alcohol and does not use drugs.  Allergies  Allergen Reactions   Molds & Smuts     Family History  Problem Relation Age of Onset   Arthritis Brother     Prior to Admission medications   Medication Sig Start Date End Date Taking? Authorizing Provider  acetaminophen (TYLENOL) 500 MG tablet Take 500 mg by mouth every 6 (six) hours as needed.    [provider]  albuterol (PROVENTIL HFA;VENTOLIN HFA) 108 (90 Base) MCG/ACT inhaler Inhale 2 puffs into the lungs every 4 (four) hours as needed for wheezing or shortness of breath.    [provider]  aspirin 81 MG chewable tablet Chew 81 mg by mouth daily.    [provider]  atorvastatin (LIPITOR) 40 MG tablet Take 0.5 tablets by mouth at bedtime. 07/20/22   [provider]  cetirizine (ZYRTEC) 5  MG tablet Take 5 mg by mouth daily. 02/09/22   [provider]  chlorpheniramine (CHLOR-TRIMETON) 4 MG tablet Take 4 mg by mouth at bedtime.    [provider]  docusate sodium (COLACE) 100 MG capsule Take 100 mg by mouth daily.     [provider]  donepezil (ARICEPT) 10 MG tablet Take 1 tablet by mouth daily. 02/09/22   [provider]  ferrous sulfate 325  (65 FE) MG tablet Take 325 mg by mouth every evening.     [provider]  finasteride (PROSCAR) 5 MG tablet Take 5 mg by mouth daily.    [provider]  fluticasone (FLONASE) 50 MCG/ACT nasal spray Place 2 sprays into both nostrils 2 (two) times daily.    [provider]  gabapentin (NEURONTIN) 300 MG capsule Take 300 mg by mouth 3 (three) times daily. 07/20/22   [provider]  hydroxypropyl methylcellulose / hypromellose (ISOPTO TEARS / GONIOVISC) 2.5 % ophthalmic solution Place 1 drop into both eyes as needed for dry eyes.     [provider]  hydroxyurea (HYDREA) 500 MG capsule Take 500 mg by mouth at bedtime. May take with food to minimize GI side effects.    [provider]  ibuprofen (ADVIL) 200 MG tablet Take 200 mg by mouth every 6 (six) hours as needed.    [provider]  ipratropium (ATROVENT) 0.06 % nasal spray Place 2 sprays into both nostrils every 12 (twelve) hours.    [provider]  loratadine (CLARITIN) 10 MG tablet Take 10 mg by mouth daily. 07/20/22   [provider]  losartan (COZAAR) 25 MG tablet Take 1 tablet by mouth daily. 07/20/22   [provider]  mometasone (ASMANEX 60 METERED DOSES) 220 MCG/INH inhaler Inhale 2 puffs into the lungs daily.    [provider]  pantoprazole (PROTONIX) 40 MG tablet Take 1 tablet by mouth daily. 07/20/22   [provider]  tamsulosin (FLOMAX) 0.4 MG CAPS capsule Take 0.4 mg by mouth daily. 07/20/22   [provider]  tiotropium (SPIRIVA) 18 MCG inhalation capsule Place 18 mcg into inhaler and inhale daily.    [provider]  traMADol (ULTRAM) 50 MG tablet Take 50 mg by mouth every 12 (twelve) hours as needed for moderate pain.  Patient not taking: Reported on 08/09/2022    [provider]  Vitamin D, Ergocalciferol, (DRISDOL) 1.25 MG (50000 UNIT) CAPS capsule Take 50,000 Units by mouth every 7 (seven)  days.    [provider]    Physical Exam: Vitals:   11/20/23 1730 11/20/23 1800 11/20/23 1930 11/20/23 2030  BP: 135/76  (!) 100/58 126/71  Pulse: 91 92 88 90  Resp: 17 20 18  (!) 28  Temp: 98.4 F (36.9 C)     TempSrc: Axillary     SpO2: 99% 99% 94% 94%  Weight:      Height:       Physical Exam Vitals and nursing note reviewed.  Constitutional:      General: He is not in acute distress.    Comments: Jack Rios male, appears restless and confused but in no distress.  Daughter at bedside  HENT:     Head: Normocephalic and atraumatic.  Cardiovascular:     Rate and Rhythm: Normal rate and regular rhythm.     Heart sounds: Normal heart sounds.  Pulmonary:     Effort: Pulmonary effort is normal.     Breath sounds: Normal breath sounds.  Abdominal:     Palpations: Abdomen is soft.     Tenderness: There is no abdominal tenderness.  Neurological:     Mental Status: Mental status is at baseline.     Labs on Admission: I have personally reviewed following labs and imaging studies  CBC: Recent Labs  Lab 11/20/23 1629  WBC 23.5*  NEUTROABS 20.7*  HGB 10.1*  HCT 32.8*  MCV 111.2*  PLT 1,551*   Basic Metabolic Panel: Recent Labs  Lab 11/20/23 1629  NA 155*  K 4.2  CL 121*  CO2 21*  GLUCOSE 133*  BUN 39*  CREATININE 1.69*  CALCIUM 9.4   GFR: Estimated Creatinine Clearance: 33 mL/min (A) (by C-G formula based on SCr of 1.69 mg/dL (H)). Liver Function Tests: Recent Labs  Lab 11/20/23 1629  AST 28  ALT 17  ALKPHOS 67  BILITOT 1.4*  PROT 7.4  ALBUMIN 3.6   No results for input(s): "LIPASE", "AMYLASE" in the last 168 hours. No results for input(s): "AMMONIA" in the last 168 hours. Coagulation Profile: Recent Labs  Lab 11/20/23 1629  INR 1.5*   Cardiac Enzymes: No results for input(s): "CKTOTAL", "CKMB", "CKMBINDEX", "TROPONINI" in the last 168 hours. BNP (last 3 results) No results for input(s): "PROBNP" in the last 8760  hours. HbA1C: No results for input(s): "HGBA1C" in the last 72 hours. CBG: No results for input(s): "GLUCAP" in the last 168 hours. Lipid Profile: No results for input(s): "CHOL", "HDL", "LDLCALC", "TRIG", "CHOLHDL", "LDLDIRECT" in the last 72 hours. Thyroid Function Tests: No results for input(s): "TSH", "T4TOTAL", "FREET4", "T3FREE", "THYROIDAB" in the last 72 hours. Anemia Panel: No results for input(s): "VITAMINB12", "FOLATE", "FERRITIN", "TIBC", "IRON", "RETICCTPCT" in the last 72 hours. Urine analysis:    Component Value Date/Time   COLORURINE YELLOW (A) 08/08/2022 2333   APPEARANCEUR CLEAR (A) 08/08/2022 2333   LABSPEC 1.023 08/08/2022 2333   PHURINE 5.0 08/08/2022 2333   GLUCOSEU NEGATIVE 08/08/2022 2333   HGBUR NEGATIVE 08/08/2022 2333   BILIRUBINUR NEGATIVE 08/08/2022 2333   KETONESUR NEGATIVE 08/08/2022 2333   PROTEINUR 100 (A) 08/08/2022 2333   NITRITE NEGATIVE 08/08/2022 2333   LEUKOCYTESUR NEGATIVE 08/08/2022 2333    Radiological Exams on Admission: DG Chest Port 1 View Result Date: 11/20/2023 CLINICAL DATA:  Questionable sepsis EXAM: PORTABLE CHEST 1 VIEW COMPARISON:  Chest x-ray 01/11/2023 FINDINGS: There are patchy airspace opacities in the right lung base. There are minimal patchy airspace opacities in the bilateral lung bases. There is no pleural effusion or pneumothorax. The cardiomediastinal silhouette is within normal limits. No acute fractures are seen. There are degenerative changes of the left shoulder. IMPRESSION: Patchy airspace opacities in the right lung base and minimal patchy airspace opacities in the bilateral lung bases, concerning for pneumonia. Electronically Signed   By: Tyron Gallon M.D.   On: 11/20/2023 17:13     Data Reviewed: Relevant notes from primary care and specialist visits, past discharge summaries as available in EHR, including Care Everywhere. Prior diagnostic testing as pertinent to current admission diagnoses Updated medications  and problem lists for reconciliation ED course, including vitals, labs, imaging, treatment and response to treatment Triage notes, nursing and pharmacy notes and ED provider's notes Notable results as noted in HPI   Assessment and Plan: * Bilateral pneumonia Suspect severe sepsis Acute respiratory failure with hypoxia Sepsis criteria include tachycardia ,hypotension and hypoxia to 77% with EMS, tachypnea, leukocytosis with lactic acidosis, AMS and AKI Sepsis fluids Rocephin and azithromycin Will keep n.p.o. pending speech eval  for aspiration risk Albuterol as needed Continue supplemental oxygen and wean as tolerated  AKI (acute kidney injury) (HCC) Hypernatremia Inadequate oral intake  AKI and hypernatremia likely related to ATN from dehydration from poor oral intake as well as hypotension related to sepsis Continue IV fluid resuscitation with LR Monitor sodium-maintenance with D5 to help bring down sodium Monitor renal function and avoid nephrotoxins Keeping n.p.o. Dietary consult  Acute metabolic encephalopathy Lethargy, AMS related to sepsis with a background of advanced dementia Keeping n.p.o. tonight Aspiration precautions, neurologic checks  Thrombocytosis Previously on urea which was discontinued due to patient's poor oral intake related to advanced dementia   Dementia without behavioral disturbance (HCC) Frailty/physical deconditioning Consider palliative care consult Per ED provider, patient's daughters are deferring to patient's wife who currently wants him to be a full code Holding all meds pending speech therapy eval  BPH (benign prostatic hyperplasia) Holding meds tonight     DVT prophylaxis: Lovenox  Consults: none  Advance Care Planning:   Code Status: Full Code , discussed with daughter  Family Communication: Daughter Jack Rios  Disposition Plan: Back to previous home environment  Severity of Illness: The appropriate patient status for  this patient is OBSERVATION. Observation status is judged to be reasonable and necessary in order to provide the required intensity of service to ensure the patient's safety. The patient's presenting symptoms, physical exam findings, and initial radiographic and laboratory data in the context of their medical condition is felt to place them at decreased risk for further clinical deterioration. Furthermore, it is anticipated that the patient will be medically stable for discharge from the hospital within 2 midnights of admission.   Author: Lanetta Pion, MD 11/20/2023 9:02 PM  For on call review [email or web address].

## 2023-11-20 NOTE — Assessment & Plan Note (Signed)
 Previously on urea which was discontinued due to patient's poor oral intake related to advanced dementia

## 2023-11-20 NOTE — Progress Notes (Signed)
 Elink is following code sepsis.

## 2023-11-21 ENCOUNTER — Other Ambulatory Visit: Payer: Self-pay

## 2023-11-21 DIAGNOSIS — Z66 Do not resuscitate: Secondary | ICD-10-CM | POA: Diagnosis not present

## 2023-11-21 DIAGNOSIS — A419 Sepsis, unspecified organism: Secondary | ICD-10-CM | POA: Diagnosis present

## 2023-11-21 DIAGNOSIS — F0390 Unspecified dementia without behavioral disturbance: Secondary | ICD-10-CM | POA: Diagnosis present

## 2023-11-21 DIAGNOSIS — R6520 Severe sepsis without septic shock: Secondary | ICD-10-CM | POA: Diagnosis present

## 2023-11-21 DIAGNOSIS — I10 Essential (primary) hypertension: Secondary | ICD-10-CM | POA: Diagnosis present

## 2023-11-21 DIAGNOSIS — Z515 Encounter for palliative care: Secondary | ICD-10-CM | POA: Diagnosis not present

## 2023-11-21 DIAGNOSIS — E870 Hyperosmolality and hypernatremia: Secondary | ICD-10-CM | POA: Diagnosis present

## 2023-11-21 DIAGNOSIS — R911 Solitary pulmonary nodule: Secondary | ICD-10-CM | POA: Diagnosis present

## 2023-11-21 DIAGNOSIS — G629 Polyneuropathy, unspecified: Secondary | ICD-10-CM | POA: Diagnosis present

## 2023-11-21 DIAGNOSIS — J189 Pneumonia, unspecified organism: Secondary | ICD-10-CM

## 2023-11-21 DIAGNOSIS — E872 Acidosis, unspecified: Secondary | ICD-10-CM | POA: Diagnosis present

## 2023-11-21 DIAGNOSIS — D75839 Thrombocytosis, unspecified: Secondary | ICD-10-CM

## 2023-11-21 DIAGNOSIS — N179 Acute kidney failure, unspecified: Secondary | ICD-10-CM | POA: Diagnosis not present

## 2023-11-21 DIAGNOSIS — N400 Enlarged prostate without lower urinary tract symptoms: Secondary | ICD-10-CM | POA: Diagnosis present

## 2023-11-21 DIAGNOSIS — E785 Hyperlipidemia, unspecified: Secondary | ICD-10-CM | POA: Diagnosis present

## 2023-11-21 DIAGNOSIS — E860 Dehydration: Secondary | ICD-10-CM | POA: Diagnosis present

## 2023-11-21 DIAGNOSIS — Z87891 Personal history of nicotine dependence: Secondary | ICD-10-CM | POA: Diagnosis not present

## 2023-11-21 DIAGNOSIS — Z7189 Other specified counseling: Secondary | ICD-10-CM | POA: Diagnosis not present

## 2023-11-21 DIAGNOSIS — J9601 Acute respiratory failure with hypoxia: Secondary | ICD-10-CM | POA: Diagnosis present

## 2023-11-21 DIAGNOSIS — J440 Chronic obstructive pulmonary disease with acute lower respiratory infection: Secondary | ICD-10-CM | POA: Diagnosis present

## 2023-11-21 DIAGNOSIS — N170 Acute kidney failure with tubular necrosis: Secondary | ICD-10-CM | POA: Diagnosis present

## 2023-11-21 DIAGNOSIS — G9341 Metabolic encephalopathy: Secondary | ICD-10-CM | POA: Diagnosis present

## 2023-11-21 DIAGNOSIS — Z1152 Encounter for screening for COVID-19: Secondary | ICD-10-CM | POA: Diagnosis not present

## 2023-11-21 DIAGNOSIS — Z7982 Long term (current) use of aspirin: Secondary | ICD-10-CM | POA: Diagnosis not present

## 2023-11-21 DIAGNOSIS — M1990 Unspecified osteoarthritis, unspecified site: Secondary | ICD-10-CM | POA: Diagnosis present

## 2023-11-21 DIAGNOSIS — R64 Cachexia: Secondary | ICD-10-CM | POA: Diagnosis present

## 2023-11-21 LAB — BASIC METABOLIC PANEL WITH GFR
Anion gap: 9 (ref 5–15)
BUN: 31 mg/dL — ABNORMAL HIGH (ref 8–23)
CO2: 23 mmol/L (ref 22–32)
Calcium: 9 mg/dL (ref 8.9–10.3)
Chloride: 121 mmol/L — ABNORMAL HIGH (ref 98–111)
Creatinine, Ser: 1.24 mg/dL (ref 0.61–1.24)
GFR, Estimated: 57 mL/min — ABNORMAL LOW (ref >60)
Glucose, Bld: 128 mg/dL — ABNORMAL HIGH (ref 70–99)
Potassium: 3.2 mmol/L — ABNORMAL LOW (ref 3.5–5.1)
Sodium: 153 mmol/L — ABNORMAL HIGH (ref 135–145)

## 2023-11-21 LAB — CBC
HCT: 29.2 % — ABNORMAL LOW (ref 39.0–52.0)
Hemoglobin: 9.1 g/dL — ABNORMAL LOW (ref 13.0–17.0)
MCH: 34.6 pg — ABNORMAL HIGH (ref 26.0–34.0)
MCHC: 31.2 g/dL (ref 30.0–36.0)
MCV: 111 fL — ABNORMAL HIGH (ref 80.0–100.0)
Platelets: 1217 10*3/uL (ref 150–400)
RBC: 2.63 MIL/uL — ABNORMAL LOW (ref 4.22–5.81)
RDW: 15.9 % — ABNORMAL HIGH (ref 11.5–15.5)
WBC: 17.3 10*3/uL — ABNORMAL HIGH (ref 4.0–10.5)
nRBC: 0.2 % (ref 0.0–0.2)

## 2023-11-21 LAB — PROTIME-INR
INR: 1.5 — ABNORMAL HIGH (ref 0.8–1.2)
Prothrombin Time: 18.1 s — ABNORMAL HIGH (ref 11.4–15.2)

## 2023-11-21 NOTE — Consult Note (Signed)
 Consultation Note Date: 11/21/2023   Patient Name: Jack Rios  DOB: 02/08/1936  MRN: [id number]  Age / Sex: 88 y.o., male  PCP: [facility] Referring Physician: Brenna Cam, MD  Reason for Consultation: Establishing goals of care   HPI/Brief Hospital Course: 88 y.o. male  with past medical history of COPD, dementia, BPH, HTN and thrombocytosis admitted from home on 11/20/2023 with several day history of weakness, lethargy, shortness of breath and poor PO intake.  Found to be hypoxic, tachycardic and hypotensive by EMS In ED WBC 23,000 and elevated lactic acid  CXR revealing Patchy airspace opacities in the right lung base and minimal patchy airspace opacities in the bilateral lung bases, concerning for pneumonia.  Admitted and being treated for bilateral pneumonia meeting sepsis criteria with acute respiratory failure with hypoxia, AKI due to poor PO intake, remains NPO due to concerns of aspiration  Palliative medicine was consulted for assisting with goals of care conversations.  Subjective:  Extensive chart review has been completed prior to meeting patient including labs, vital signs, imaging, progress notes, orders, and available advanced directive documents from current and previous encounters.  Visited with Jack Rios at his bedside.  He is resting in bed with his eyes closed, he attempts to awaken with calling of his name but unable to follow commands or hold conversation.  He is unable to participate in goals of care conversations at this time.  Daughter Kirstene at bedside during time of visit.  Introduced myself as a Publishing rights manager as a member of the palliative care team. Explained palliative medicine is specialized medical care for people living with serious illness. It focuses on providing relief from the symptoms and stress of a serious illness. The goal is to improve quality of life for both the patient and the family.    Daughter shares entitled there are 5 children and Jack Rios is still married.  Advance directive reviewed appointing his wife Monroe Antigua and his 2 daughters Joaquim Muir and Valerie as HCPOA.  Daughter shares at baseline Jack Rios lives at home with his wife.  All of the children rotate and take turns in caring for both of their parents.  At baseline Jack Rios spends the majority of his time in bed or in a recliner lift chair.  He requires assistance with all ADLs.  Daughter shares on "good days" Jack Rios is able to converse a little.  Daughter shares a significant decline in functional and cognitive status over the last several months.  We discussed patient's current illness and what it means in the larger context of patient's on-going co-morbidities. Natural disease trajectory and expectations at EOL were discussed.   Daughter shares there is disconnect between the children and their mother.  Daughter shares her and her siblings are aware of Jack Rios's advanced and progressing dementia and feel at this time there is a level of suffering.  The daughter shares that her mother is struggling with excepting Jack Rios's decline.  Daughter shares conversations have been had with him regarding CODE STATUS, all siblings are on board with DNR but mother wishes for Jack Rios to remain full code.  The difference between aggressive medical intervention and comfort care was discussed.  We also discussed the overall philosophy of hospice and the possibility of hospice services coming into the home.  Daughter shares she feels her mother and all of her siblings would be on board with pursuing hospice services in the home.  Plan set with Kirstene to return  to bedside when her siblings and her mother are visiting to continue goals of care conversations.  Returned to bedside later in the day and met with multiple children including both HCPOA's-Kirstene and Tarri Farm as well as wife-Shirley.  Again  discussed patient's current illness and what it means in the larger context of patient's on-going co-morbidities. Natural disease trajectory and expectations at EOL were discussed. All family present speak to Jack Rios's decline over the last several months.  Discussed the results of bedside swallow evaluation attempted earlier.  Shared with family that due to Jack Rios's current state of health he will have to remain n.p.o. due to the high risk of aspiration.  Introduced the topic of comfort care and hospice services.  Explained under comfort care Jack Rios would no longer receive aggressive medical interventions such as continuous vital signs, lab work, radiology testing, or medications not focused on comfort. All care would focus on how the patient is looking and feeling. This would include management of any symptoms that may cause discomfort, pain, shortness of breath, cough, nausea, agitation, anxiety, and/or secretions etc. Symptoms would be managed with medications and other non-pharmacological interventions such as spiritual support if requested, repositioning, music therapy, or therapeutic listening. Family verbalized understanding and appreciation.  Reviewed the overall philosophy of hospice and services that could be provided within the home under hospice care.  We discussed CODE STATUS and the difference between full code and DO NOT RESUSCITATE. Encouraged family to consider DNR/DNI status understanding evidenced based poor outcomes in similar hospitalized patients, as the cause of the arrest is likely associated with chronic/terminal disease rather than a reversible acute cardio-pulmonary event.    Wife and all other family members present agreed on DNR/DNI status and wish to pursue hospice services on his return home.  They are not at a place where they are ready to transition to comfort care at this time.  Explained to them even while remaining n.p.o. Jack Rios has a continued high  risk for aspiration even on his own saliva with a high risk of decline and decompensation.  In further speaking with family members it seems Jack Rios is 100% invested within the [place]. Daughter to reach out to [place] to confirm coverage and seek approval to transition to hospice care.   All questions/concerns addressed. Emotional support provided to patient/family/support persons. PMT will continue to follow and support patient as needed.  Objective: Primary Diagnoses: Present on Admission:  Sepsis (HCC)  Bilateral pneumonia  Dementia without behavioral disturbance (HCC)  Acute respiratory failure with hypoxia (HCC)  BPH (benign prostatic hyperplasia)  Sepsis due to pneumonia [facility])   Physical Exam Constitutional:      General: He is not in acute distress.    Appearance: He is cachectic. He is ill-appearing.     Comments: Intermittently attempts to open eyes to calling of his name  Pulmonary:     Effort: Pulmonary effort is normal. No respiratory distress.     Comments: Weakened, congested cough Skin:    General: Skin is warm and dry.  Neurological:     Mental Status: He is lethargic.     Motor: Weakness present.     Vital Signs: BP 127/68   Pulse 68   Temp 97.7 F (36.5 C)   Resp 16   Ht 5\' 10"  (1.778 m)   Wt 69.3 kg   SpO2 (!) 86%   BMI 21.92 kg/m  Pain Scale: Faces      IO: Intake/output summary:  Intake/Output  Summary (Last 24 hours) at 11/21/2023 1200 Last data filed at 11/21/2023 0600 Gross per 24 hour  Intake 184.75 ml  Output --  Net 184.75 ml    LBM: Last BM Date : 11/18/23 Baseline Weight: Weight: 81.6 kg Most recent weight: Weight: 69.3 kg      Assessment and Plan  SUMMARY OF RECOMMENDATIONS   DNR/DNI Return home with hospice services--will need approval from [place], daughter to reach out Monday AM PMT to follow-up  Palliative Prophylaxis:   Bowel Regimen, Delirium Protocol and Frequent Pain Assessment  Discussed With: Primary team, nursing staff  and [facility]   Thank you for this consult and allowing Palliative Medicine to participate in the care of Manny R. Izquierdo. Palliative medicine will continue to follow and assist as needed.   Time Total: 90 minutes  Time spent includes: Detailed review of medical records (labs, imaging, vital signs), medically appropriate exam (mental status, respiratory, cardiac, skin), discussed with treatment team, counseling and educating patient, family and staff, documenting clinical information, medication management and coordination of care.   Signed by: Isadore Marble, DNP, AGNP-C Palliative Medicine    Please contact Palliative Medicine Team phone at [phone] for questions and concerns.  For individual provider: See Tilford Foley

## 2023-11-21 NOTE — IPAL (Signed)
  Interdisciplinary Goals of Care Family Meeting   Date carried out: 11/21/2023  Location of the meeting: Bedside  Member's involved: Physician and Family Member or next of kin  Durable Power of Attorney or Environmental health practitioner: Daughter at bedside  Discussion: We discussed goals of care for Jack Rios   Code status:   Code Status: Full Code   Disposition: Home with Hospice  Time spent for the meeting: 35 minutes    Brenna Cam, MD  11/21/2023, 1:09 PM

## 2023-11-21 NOTE — Evaluation (Signed)
 Physical Therapy Evaluation Patient Details Name: Jack Rios MRN: [id number] DOB: 1935/10/15 Today's Date: 11/21/2023  History of Present Illness  Pt is an 88 y/o M admitted on 11/20/23 after presenting with c/o weakness, lethargy, SOB, decreased oral intake. Pt is being treated for bilateral PNA. PMH: COPD, dementia, BPH, HTN, thrombocytosis, asthma, neuropathy  Clinical Impression  Pt seen for PT evaluation with daughter present, providing PLOF, home set up information. Per daughter, pt has experienced a decline over the last month. Pt currently spending a lot of time in bed, requiring assistance for peri hygiene, but still receiving HHPT & OT, & has a personal care aide 4 hours/day, Monday-Friday. On this date, pt with poor ability to open eyes, but does follow commands to raise arms to allow PT to remove pillows. Pt noted to be incontinent of urine & bowel. Pt requires +2 assist to roll L<>R for total assist peri hygiene. Will continue to follow pt acutely to progress mobility as able & reduce caregiver burden.        If plan is discharge home, recommend the following: Two people to help with walking and/or transfers;Two people to help with bathing/dressing/bathroom;Assistance with feeding   Can travel by private vehicle   No    Equipment Recommendations Other (comment) (defer to next venue)  Recommendations for Other Services       Functional Status Assessment Patient has had a recent decline in their functional status and demonstrates the ability to make significant improvements in function in a reasonable and predictable amount of time.     Precautions / Restrictions Precautions Precautions: Fall Restrictions Weight Bearing Restrictions Per Provider Order: No      Mobility  Bed Mobility Overal bed mobility: Needs Assistance Bed Mobility: Rolling Rolling: Total assist, +2 for physical assistance, +2 for safety/equipment, Used rails              Transfers                         Ambulation/Gait                  Stairs            Wheelchair Mobility     Tilt Bed    Modified Rankin (Stroke Patients Only)       Balance                                             Pertinent Vitals/Pain Pain Assessment Pain Assessment: PAINAD Breathing: normal Negative Vocalization: occasional moan/groan, low speech, negative/disapproving quality Facial Expression: facial grimacing (with rolling in bed) Body Language: tense, distressed pacing, fidgeting (when rolling in bed) Consolability: distracted or reassured by voice/touch PAINAD Score: 5    Home Living Family/patient expects to be discharged to:: Private residence Living Arrangements: Spouse/significant other Available Help at Discharge: Family;Personal care attendant Type of Home: Mobile home Home Access: Ramped entrance       Home Layout: One level   Additional Comments: Personal care aide 4 hours/day Monday-Friday, HHPT & HHOT.    Prior Function               Mobility Comments: Per daughter, pt is residing with wife who has early dementia as well, family members living next door. Pt has PCA 4 hours/day M-F. Pt has declined in the past month,  primarily staying in bed. Daughter was able to get pt to take ~3 steps a few weeks ago. Potentially 1 fall in the past 6 months. ADLs Comments: total assist for peri hygiene, using briefs     Extremity/Trunk Assessment   Upper Extremity Assessment Upper Extremity Assessment: Difficult to assess due to impaired cognition;Generalized weakness    Lower Extremity Assessment Lower Extremity Assessment: Difficult to assess due to impaired cognition;Generalized weakness       Communication   Communication Communication: Impaired Factors Affecting Communication: Reduced clarity of speech;Difficulty expressing self    Cognition Arousal: Lethargic Behavior During Therapy: Flat affect   PT -  Cognitive impairments: Difficult to assess Difficult to assess due to: Level of arousal                       Following commands: Impaired Following commands impaired: Follows one step commands with increased time, Follows one step commands inconsistently     Cueing Cueing Techniques: Verbal cues, Visual cues     General Comments      Exercises     Assessment/Plan    PT Assessment Patient needs continued PT services  PT Problem List Decreased mobility;Decreased strength;Cardiopulmonary status limiting activity;Pain;Decreased range of motion;Decreased cognition;Decreased activity tolerance;Decreased balance;Decreased knowledge of use of DME;Decreased safety awareness       PT Treatment Interventions DME instruction;Balance training;Modalities;Gait training;Neuromuscular re-education;Stair training;Functional mobility training;Therapeutic activities;Therapeutic exercise;Manual techniques;Wheelchair mobility training;Patient/family education;Cognitive remediation    PT Goals (Current goals can be found in the Care Plan section)  Acute Rehab PT Goals PT Goal Formulation: Patient unable to participate in goal setting Time For Goal Achievement: 12/05/23 Potential to Achieve Goals: Poor    Frequency Min 1X/week     Co-evaluation               AM-PAC PT "6 Clicks" Mobility  Outcome Measure Help needed turning from your back to your side while in a flat bed without using bedrails?: Total Help needed moving from lying on your back to sitting on the side of a flat bed without using bedrails?: Total Help needed moving to and from a bed to a chair (including a wheelchair)?: Total Help needed standing up from a chair using your arms (e.g., wheelchair or bedside chair)?: Total Help needed to walk in hospital room?: Total Help needed climbing 3-5 steps with a railing? : Total 6 Click Score: 6    End of Session Equipment Utilized During Treatment: Oxygen Activity  Tolerance: Patient tolerated treatment well Patient left: in bed;with nursing/sitter in room Nurse Communication: Mobility status PT Visit Diagnosis: Muscle weakness (generalized) (M62.81);Other abnormalities of gait and mobility (R26.89);Difficulty in walking, not elsewhere classified (R26.2)    Time: 1050-1108 PT Time Calculation (min) (ACUTE ONLY): 18 min   Charges:   PT Evaluation $PT Eval Moderate Complexity: 1 Mod   PT General Charges $$ ACUTE PT VISIT: 1 Visit         Emaline Handsome, PT, DPT 11/21/23, 12:16 PM   Venetta Gill 11/21/2023, 12:12 PM

## 2023-11-21 NOTE — Care Management Obs Status (Signed)
 MEDICARE OBSERVATION STATUS NOTIFICATION   Patient Details  Name: Jack Rios MRN: [id number] Date of Birth: Dec 14, 1935   Medicare Observation Status Notification Given:  Yes    Areta Beer, RN 11/21/2023, 11:41 AM

## 2023-11-21 NOTE — Evaluation (Signed)
 Clinical/Bedside Swallow Evaluation Patient Details  Name: Jack Rios MRN: [id number] Date of Birth: January 11, 1936  Today's Date: 11/21/2023 Time: SLP Start Time (ACUTE ONLY): 0855 SLP Stop Time (ACUTE ONLY): 0955 SLP Time Calculation (min) (ACUTE ONLY): 60 min  Past Medical History:  Past Medical History:  Diagnosis Date   Arthritis    Asthma    Biliary sludge determined by ultrasound    Calculus of bile duct with acute cholangitis with obstruction    Calculus of gallbladder and bile duct without cholecystitis, with obstruction    Calculus of gallbladder without cholecystitis without obstruction    Choledocholithiasis    Common bile duct dilatation    Fever    Jaundice    Lung nodule    Neuropathy    Peptic ulcer    Pneumonia 09/26/2015   Protein-calorie malnutrition, severe 09/26/2015   Stenosis of duodenum    Transaminitis 05/01/2017   Past Surgical History:  Past Surgical History:  Procedure Laterality Date   CERVICAL SPINE SURGERY     CHOLECYSTECTOMY N/A 05/05/2017   Procedure: LAPAROSCOPIC CHOLECYSTECTOMY WITH INTRAOPERATIVE CHOLANGIOGRAM;  Surgeon: Gwyndolyn Lerner, MD;  Location: [facility] ORS;  Service: General;  Laterality: N/A;   ENDOSCOPIC RETROGRADE CHOLANGIOPANCREATOGRAPHY (ERCP) WITH PROPOFOL N/A 05/03/2017   Procedure: ENDOSCOPIC RETROGRADE CHOLANGIOPANCREATOGRAPHY (ERCP) WITH PROPOFOL;  Surgeon: Marnee Sink, MD;  Location: [facility] END[facility];  Service: Endoscopy;  Laterality: N/A;   HPI:  Pt is a 88 y.o. male with medical history significant for Advanced Dementia, COPD, Malnutrition, BPH, HTN, thrombocytosis previously on hydroxyurea, full care for ADLs at home being admitted with  pneumonia after presenting with a several day history of weakness and lethargy, shortness of breath, decreased oral intake, in the setting of chronic decline related to advanced dementia.  Patient unable to contribute to history.  O2 sat on arrival of EMS was 77% w/ crackles on R side, and  patient was placed on NRB at 15 L.  With EMS he was tachycardic to 106 and BP was 85/56.  CXR: Patchy airspace opacities in the right lung base and minimal patchy  airspace opacities in the bilateral lung bases, concerning for  pneumonia.    Assessment / Plan / Recommendation  Clinical Impression   Pt seen for BSE. Pt resting in bed; lethargic/drowsy and only opened eyes briefly to stim. Face washed and oral care given to alert pt. Daughter present. Pt has Baseline Dementia. Dtr reported a significantly declined Baseline at home. Family is discussing GOC this PM re: moving forward w/ care to include Hospice per Dtr's report.  On  O2 support, WBC 17.3. Afebrile currently. Pt w/ congested cough at rest.  Pt appears to present w/ oropharyngeal phase dysphagia in setting of declined Cognitive status; Baseline Advanced Dementia. Daughter reported that pt's functioning has declined at home. ANY Cognitive decline can impact overall awareness/timing of oral phase and swallowing thus impact safety during po tasks which increases risk for aspiration, choking as well as decreased oral intake overall. Pt presents w/ too HIGH of risk factors currently along w/ lethargy/drowsiness for safe oral intake at this time.      Recommend strict NPO status w/ frequent oral care for hygiene and stimulation of swallowing.    Oral care completed (dry, sticky mouth) then fed 3 trials of puree(applesauce) w/ No immediate, overt clinical s/s of aspiration noted: no decline in vocal quality when he verbalized 1x, no immediate cough, and no decline in respiratory status during/post trials overall. Pt's congested cough(baseline) was noted 1x  b/t trials -- unsure if related to the po's. Oral phase was c/b lengthy bolus management then A-P transfer for swallowing; oral clearing of the boluses was noted. Noted decreased laryngeal excursion by the 3rd trial. OM Exam was cursory w/ generalized OM weakness noted overall; no unilateral  weakness noted. Some confusion w/ oral care noted. Hand over hand guidance and MAX cues given during po trials.  No further trials/consistencies assessed d/t pt's lethargy/drowsiness and HIGH risk for aspiration.         In setting of pt's current presentation and HIGH risk for aspiration and Pulmonary impact, recommend STRICT NPO status w/ frequent oral care for hygiene and stimulation of swallowing; aspiration precautions. MD/NSG updated.  ST services recommends follow w/ Palliative Care for GOC and education re: impact of Cognitive decline/Dementia on safe swallowing as well as oral intake in general. ST services will f/u w/ pt's tomorrow for ongoing assessment and po trials if appropriate. Education provided to Daughter present who agreed w/ POC. Precautions posted in room, chart. SLP Visit Diagnosis: Dysphagia, oropharyngeal phase (R13.12) (Advanced Dementia; illness; advanced age)    Aspiration Risk  Severe aspiration risk;Risk for inadequate nutrition/hydration    Diet Recommendation   NPO  Medication Administration: Via alternative means    Other  Recommendations Recommended Consults:  (Palliative Care; Dietician) Oral Care Recommendations: Oral care QID;Staff/trained caregiver to provide oral care Caregiver Recommendations:  (tbd)    Recommendations for follow up therapy are one component of a multi-disciplinary discharge planning process, led by the attending physician.  Recommendations may be updated based on patient status, additional functional criteria and insurance authorization.  Follow up Recommendations  (tbd)      Assistance Recommended at Discharge  FULL d/t Dementia  Functional Status Assessment Patient has had a recent decline in their functional status and/or demonstrates limited ability to make significant improvements in function in a reasonable and predictable amount of time  Frequency and Duration min 2x/week  2 weeks       Prognosis Prognosis for improved  oropharyngeal function: Guarded Barriers to Reach Goals: Cognitive deficits;Time post onset;Severity of deficits Barriers/Prognosis Comment: Advanced Dementia; illness; advanced age      Swallow Study   General Date of Onset: 11/20/23 HPI: Pt is a 88 y.o. male with medical history significant for Advanced Dementia, COPD, Malnutrition, BPH, HTN, thrombocytosis previously on hydroxyurea, full care for ADLs at home being admitted with  pneumonia after presenting with a several day history of weakness and lethargy, shortness of breath, decreased oral intake, in the setting of chronic decline related to advanced dementia.  Patient unable to contribute to history.  O2 sat on arrival of EMS was 77% w/ crackles on R side, and patient was placed on NRB at 15 L.  With EMS he was tachycardic to 106 and BP was 85/56.  CXR: Patchy airspace opacities in the right lung base and minimal patchy  airspace opacities in the bilateral lung bases, concerning for  pneumonia. Type of Study: Bedside Swallow Evaluation Previous Swallow Assessment: none Diet Prior to this Study: NPO Temperature Spikes Noted: No (wbc 17.3) Respiratory Status: Nasal cannula (5-15L) History of Recent Intubation: No Behavior/Cognition: Cooperative;Pleasant mood;Confused;Lethargic/Drowsy;Requires cueing;Doesn't follow directions Oral Cavity Assessment: Dry (sticky) Oral Care Completed by SLP: Yes Oral Cavity - Dentition: Adequate natural dentition;Missing dentition (few) Vision:  (n/a) Self-Feeding Abilities: Total assist Patient Positioning: Upright in bed (MAX assist) Baseline Vocal Quality: Low vocal intensity (mumbled/muttered x2) Volitional Cough: Cognitively unable to elicit Volitional Swallow: Unable  to elicit    Oral/Motor/Sensory Function Overall Oral Motor/Sensory Function: Generalized oral weakness (no overt unilateral weakness; open-mouth posture at rest)   Ice Chips Ice chips: Not tested   Thin Liquid Thin Liquid: Not  tested    Nectar Thick Nectar Thick Liquid: Not tested   Honey Thick Honey Thick Liquid: Not tested   Puree Puree: Impaired Presentation: Spoon (fed; 3 trials) Oral Phase Impairments: Reduced labial seal;Reduced lingual movement/coordination;Poor awareness of bolus Oral Phase Functional Implications: Prolonged oral transit;Oral holding Pharyngeal Phase Impairments: Suspected delayed Swallow Other Comments: no immediate cough   Solid     Solid: Not tested        Darla Edward, MS, CCC-SLP Speech Language Pathologist Rehab Services; [facility] [phone] (ascom) [place] 11/21/2023,12:00 PM

## 2023-11-21 NOTE — Progress Notes (Signed)
 MEWS Progress Note  Patient Details Name: Jack Rios MRN: [id number] DOB: 06-07-1936 Today's Date: 11/21/2023   MEWS Flowsheet Documentation:  Assess: MEWS Score Temp: 97.7 F (36.5 C) BP: 127/68 MAP (mmHg): 83 Pulse Rate: 68 ECG Heart Rate: 92 Resp: 16 Level of Consciousness: Alert SpO2: 90 % O2 Device: Nasal Cannula O2 Flow Rate (L/min): 5 L/min Assess: MEWS Score MEWS Temp: 0 MEWS Systolic: 0 MEWS Pulse: 0 MEWS RR: 0 MEWS LOC: 0 MEWS Score: 0 MEWS Score Color: Green Assess: SIRS CRITERIA SIRS Temperature : 0 SIRS Respirations : 0 SIRS Pulse: 0 SIRS WBC: 0 SIRS Score Sum : 0 Assess: if the MEWS score is Yellow or Red Were vital signs accurate and taken at a resting state?: Yes Does the patient meet 2 or more of the SIRS criteria?: Yes Does the patient have a confirmed or suspected source of infection?: Yes        Almarie Arias 11/21/2023, 3:06 PM

## 2023-11-21 NOTE — Progress Notes (Signed)
 Pt Dr Mason Sole, dc tele monitoring

## 2023-11-21 NOTE — Progress Notes (Signed)
 1      PROGRESS NOTE    Jack Rios  ZOX:[id number] DOB: 1936/03/04 DOA: 11/20/2023 PCP: [facility]    Brief Narrative:   88 y.o. male with medical history significant for COPD, dementia, BPH, HTN, thrombocytosis previously on hydroxyurea, being admitted with  pneumonia after presenting with a several day history of weakness and lethargy, shortness of breath, decreased oral intake, in the setting of chronic decline related to advanced dementia   4/13: Palliative care consult   Assessment & Plan:   Principal Problem:   Bilateral pneumonia Active Problems:   Acute respiratory failure with hypoxia (HCC)   Sepsis (HCC)   Hypernatremia   AKI (acute kidney injury) (HCC)   Inadequate oral intake   Thrombocytosis   Acute metabolic encephalopathy   Dementia without behavioral disturbance (HCC)   Frailty   BPH (benign prostatic hyperplasia)   Sepsis due to pneumonia (HCC)   * Bilateral pneumonia Suspect severe sepsis present on admission due to pneumonia Acute respiratory failure with hypoxia Sepsis criteria include tachycardia ,hypotension and hypoxia to 77% with EMS, tachypnea, leukocytosis with lactic acidosis, AMS and AKI Continue IV fluids Continue Rocephin and azithromycin Speech therapy evaluation recommends n.p.o. Albuterol as needed Continue supplemental oxygen and wean as tolerated   AKI (acute kidney injury) (HCC) Hypernatremia Inadequate oral intake  AKI and hypernatremia likely related to ATN from dehydration from poor oral intake as well as hypotension related to sepsis Continue IV fluid resuscitation with LR Monitor sodium-maintenance with D5 to help bring down sodium Monitor renal function and avoid nephrotoxins Keeping n.p.o. Dietary consult   Acute metabolic encephalopathy Lethargy, AMS related to sepsis with a background of advanced dementia N.p.o. for now Aspiration precautions, neurologic checks   Thrombocytosis Previously on urea which  was discontinued due to patient's poor oral intake related to advanced dementia     Dementia without behavioral disturbance (HCC) Frailty/physical deconditioning palliative care consult pending Per ED provider, patient's daughters are deferring to patient's wife who currently wants him to be a full code Holding all meds pending speech therapy eval   BPH (benign prostatic hyperplasia) Holding meds for now   Goals of care Discussed with daughter at bedside.  She is considering comfort care but would like to talk with other family members especially mother He seemed to be actively dying.  I have shared this with his daughter she is seem to be understanding but needs other family to be on board before confirming DNR and comfort care.   DVT prophylaxis: (Lovenox enoxaparin (LOVENOX) injection 40 mg Start: 11/20/23 2115     Code Status: (Full code Family Communication: (Updated patient's daughter at bedside) Disposition Plan: Possible discharge in next 2 to 3 days depending on clinical condition and goals of care discussion   Consultants:  Palliative care    Antimicrobials:  Rocephin and Zithromax   Subjective: Remains weak and lethargic.  Daughter at bedside   Objective: Vitals:   11/21/23 0300 11/21/23 0457 11/21/23 0943 11/21/23 1145  BP:  134/75 (!) 140/71 127/68  Pulse:  88 76 68  Resp:  18 20 16   Temp:  98.5 F (36.9 C) 97.9 F (36.6 C) 97.7 F (36.5 C)  TempSrc:  Oral    SpO2:  92% 97% (!) 86%  Weight: 69.3 kg     Height:        Intake/Output Summary (Last 24 hours) at 11/21/2023 1259 Last data filed at 11/21/2023 0600 Gross per 24 hour  Intake 184.75  ml  Output --  Net 184.75 ml   Filed Weights   11/20/23 1635 11/21/23 0300  Weight: 81.6 kg 69.3 kg    Examination:  General exam: 88 year old frail appearing male, chronically ill looking and likely actively transitioning Respiratory system: Clear to auscultation. Respiratory effort  normal. Cardiovascular system: S1 & S2 heard, RRR. No murmurs Gastrointestinal system: Abdomen is soft, benign Central nervous system: Lethargic, no focal neurological deficits. Extremities: Symmetric 5 x 5 power.     Data Reviewed: I have personally reviewed following labs and imaging studies  CBC: Recent Labs  Lab 11/20/23 1629 11/21/23 0504  WBC 23.5* 17.3*  NEUTROABS 20.7*  --   HGB 10.1* 9.1*  HCT 32.8* 29.2*  MCV 111.2* 111.0*  PLT 1,551* 1,217*   Basic Metabolic Panel: Recent Labs  Lab 11/20/23 1629 11/21/23 0504  NA 155* 153*  K 4.2 3.2*  CL 121* 121*  CO2 21* 23  GLUCOSE 133* 128*  BUN 39* 31*  CREATININE 1.69* 1.24  CALCIUM 9.4 9.0   GFR: Estimated Creatinine Clearance: 42.7 mL/min (by C-G formula based on SCr of 1.24 mg/dL). Liver Function Tests: Recent Labs  Lab 11/20/23 1629  AST 28  ALT 17  ALKPHOS 67  BILITOT 1.4*  PROT 7.4  ALBUMIN 3.6   No results for input(s): "LIPASE", "AMYLASE" in the last 168 hours. No results for input(s): "AMMONIA" in the last 168 hours. Coagulation Profile: Recent Labs  Lab 11/20/23 1629 11/21/23 0504  INR 1.5* 1.5*    Sepsis Labs: Recent Labs  Lab 11/20/23 1629 11/20/23 2041  LATICACIDVEN 3.2* 1.9    Recent Results (from the past 240 hours)  Blood Culture (routine x 2)     Status: None (Preliminary result)   Collection Time: 11/20/23  4:29 PM   Specimen: BLOOD  Result Value Ref Range Status   Specimen Description BLOOD RIGHT ANTECUBITAL  Final   Special Requests   Final    BOTTLES DRAWN AEROBIC AND ANAEROBIC Blood Culture results may not be optimal due to an inadequate volume of blood received in culture bottles   Culture   Final    NO GROWTH < 24 HOURS Performed at [facility], [address], [place], [place] [address]    Report Status PENDING  Incomplete  Resp panel by RT-PCR (RSV, Flu A&B, Covid) Anterior Nasal Swab     Status: None   Collection Time: 11/20/23  5:30 PM    Specimen: Anterior Nasal Swab  Result Value Ref Range Status   SARS Coronavirus 2 by RT PCR NEGATIVE NEGATIVE Final    Comment: (NOTE) SARS-CoV-2 target nucleic acids are NOT DETECTED.  The SARS-CoV-2 RNA is generally detectable in upper respiratory specimens during the acute phase of infection. The lowest concentration of SARS-CoV-2 viral copies this assay can detect is 138 copies/mL. A negative result does not preclude SARS-Cov-2 infection and should not be used as the sole basis for treatment or other patient management decisions. A negative result may occur with  improper specimen collection/handling, submission of specimen other than nasopharyngeal swab, presence of viral mutation(s) within the areas targeted by this assay, and inadequate number of viral copies(<138 copies/mL). A negative result must be combined with clinical observations, patient history, and epidemiological information. The expected result is Negative.  Fact Sheet for Patients:  BloggerCourse.com  Fact Sheet for Healthcare Providers:  SeriousBroker.it  This test is no t yet approved or cleared by the [place] FDA and  has been authorized for detection  and/or diagnosis of SARS-CoV-2 by FDA under an Emergency Use Authorization (EUA). This EUA will remain  in effect (meaning this test can be used) for the duration of the COVID-19 declaration under Section 564(b)(1) of the Act, 21 U.S.C.section 360bbb-3(b)(1), unless the authorization is terminated  or revoked sooner.       Influenza A by PCR NEGATIVE NEGATIVE Final   Influenza B by PCR NEGATIVE NEGATIVE Final    Comment: (NOTE) The Xpert Xpress SARS-CoV-2/FLU/RSV plus assay is intended as an aid in the diagnosis of influenza from Nasopharyngeal swab specimens and should not be used as a sole basis for treatment. Nasal washings and aspirates are unacceptable for Xpert Xpress  SARS-CoV-2/FLU/RSV testing.  Fact Sheet for Patients: BloggerCourse.com  Fact Sheet for Healthcare Providers: SeriousBroker.it  This test is not yet approved or cleared by the [place]  FDA and has been authorized for detection and/or diagnosis of SARS-CoV-2 by FDA under an Emergency Use Authorization (EUA). This EUA will remain in effect (meaning this test can be used) for the duration of the COVID-19 declaration under Section 564(b)(1) of the Act, 21 U.S.C. section 360bbb-3(b)(1), unless the authorization is terminated or revoked.     Resp Syncytial Virus by PCR NEGATIVE NEGATIVE Final    Comment: (NOTE) Fact Sheet for Patients: BloggerCourse.com  Fact Sheet for Healthcare Providers: SeriousBroker.it  This test is not yet approved or cleared by the [place]  FDA and has been authorized for detection and/or diagnosis of SARS-CoV-2 by FDA under an Emergency Use Authorization (EUA). This EUA will remain in effect (meaning this test can be used) for the duration of the COVID-19 declaration under Section 564(b)(1) of the Act, 21 U.S.C. section 360bbb-3(b)(1), unless the authorization is terminated or revoked.  Performed at [facility], [address] [place], [place] [address]   Blood Culture (routine x 2)     Status: None (Preliminary result)   Collection Time: 11/20/23  8:41 PM   Specimen: BLOOD  Result Value Ref Range Status   Specimen Description BLOOD BLOOD LEFT ARM  Final   Special Requests   Final    BOTTLES DRAWN AEROBIC AND ANAEROBIC Blood Culture results may not be optimal due to an excessive volume of blood received in culture bottles   Culture  Setup Time ANAEROBIC BOTTLE ONLY NO ORGANISMS SEEN   Final   Culture   Final    NO GROWTH < 12 HOURS Performed at [facility], [address], [place], [place] [address]    Report Status  PENDING  Incomplete         Radiology Studies: DG Chest Port 1 View Result Date: 11/20/2023 CLINICAL DATA:  Questionable sepsis EXAM: PORTABLE CHEST 1 VIEW COMPARISON:  Chest x-ray 01/11/2023 FINDINGS: There are patchy airspace opacities in the right lung base. There are minimal patchy airspace opacities in the bilateral lung bases. There is no pleural effusion or pneumothorax. The cardiomediastinal silhouette is within normal limits. No acute fractures are seen. There are degenerative changes of the left shoulder. IMPRESSION: Patchy airspace opacities in the right lung base and minimal patchy airspace opacities in the bilateral lung bases, concerning for pneumonia. Electronically Signed   By: Tyron Gallon M.D.   On: 11/20/2023 17:13        Scheduled Meds:  enoxaparin (LOVENOX) injection  40 mg Subcutaneous Q24H   Continuous Infusions:  azithromycin     cefTRIAXone (ROCEPHIN)  IV     dextrose 125 mL/hr at 11/21/23 0543  LOS: 0 days    Time spent: 35 minutes    Brenna Cam, MD [facility] Pager [phone]  If 7PM-7AM, please contact night-coverage [email or web address]  11/21/2023, 12:59 PM

## 2023-11-22 DIAGNOSIS — Z7189 Other specified counseling: Secondary | ICD-10-CM | POA: Diagnosis not present

## 2023-11-22 DIAGNOSIS — N179 Acute kidney failure, unspecified: Secondary | ICD-10-CM | POA: Diagnosis not present

## 2023-11-22 DIAGNOSIS — F0390 Unspecified dementia without behavioral disturbance: Secondary | ICD-10-CM | POA: Diagnosis not present

## 2023-11-22 DIAGNOSIS — Z66 Do not resuscitate: Secondary | ICD-10-CM | POA: Diagnosis not present

## 2023-11-22 DIAGNOSIS — A419 Sepsis, unspecified organism: Secondary | ICD-10-CM | POA: Diagnosis not present

## 2023-11-22 DIAGNOSIS — Z515 Encounter for palliative care: Secondary | ICD-10-CM | POA: Diagnosis not present

## 2023-11-22 DIAGNOSIS — J189 Pneumonia, unspecified organism: Secondary | ICD-10-CM | POA: Diagnosis not present

## 2023-11-22 MED ORDER — LORAZEPAM 2 MG/ML IJ SOLN
1.0000 mg | INTRAMUSCULAR | Status: DC | PRN
  Administered 2023-11-23: 1 mg via INTRAVENOUS
  Filled 2023-11-22: qty 1

## 2023-11-22 MED ORDER — ACETAMINOPHEN 325 MG PO TABS: 650.0000 mg | ORAL_TABLET | Freq: Four times a day (QID) | ORAL | Status: DC | PRN

## 2023-11-22 MED ORDER — MORPHINE SULFATE (PF) 2 MG/ML IV SOLN
1.0000 mg | INTRAVENOUS | Status: DC | PRN
  Filled 2023-11-22: qty 1

## 2023-11-22 MED ORDER — GLYCOPYRROLATE 0.2 MG/ML IJ SOLN: 0.2000 mg | INTRAMUSCULAR | Status: DC | PRN

## 2023-11-22 MED ORDER — HALOPERIDOL LACTATE 5 MG/ML IJ SOLN: 2.0000 mg | Freq: Four times a day (QID) | INTRAMUSCULAR | Status: DC | PRN

## 2023-11-22 MED ORDER — POLYVINYL ALCOHOL 1.4 % OP SOLN: 1.0000 [drp] | Freq: Four times a day (QID) | OPHTHALMIC | Status: DC | PRN

## 2023-11-22 MED ORDER — DIPHENHYDRAMINE HCL 50 MG/ML IJ SOLN: 25.0000 mg | INTRAMUSCULAR | Status: DC | PRN

## 2023-11-22 MED ORDER — GLYCOPYRROLATE 1 MG PO TABS: 1.0000 mg | ORAL_TABLET | ORAL | Status: DC | PRN

## 2023-11-22 MED ORDER — HALOPERIDOL 0.5 MG PO TABS: 2.0000 mg | ORAL_TABLET | Freq: Four times a day (QID) | ORAL | Status: DC | PRN

## 2023-11-22 MED ORDER — ACETAMINOPHEN 650 MG RE SUPP: 650.0000 mg | Freq: Four times a day (QID) | RECTAL | Status: DC | PRN

## 2023-11-22 MED ORDER — ONDANSETRON HCL 4 MG/2ML IJ SOLN: 4.0000 mg | Freq: Four times a day (QID) | INTRAMUSCULAR | Status: DC | PRN

## 2023-11-22 MED ORDER — HALOPERIDOL LACTATE 2 MG/ML PO CONC: 2.0000 mg | Freq: Four times a day (QID) | ORAL | Status: DC | PRN

## 2023-11-22 MED ORDER — BIOTENE DRY MOUTH MT LIQD
15.0000 mL | Freq: Two times a day (BID) | OROMUCOSAL | Status: DC
  Administered 2023-11-22 – 2023-11-23 (×2): 15 mL via TOPICAL

## 2023-11-22 MED ORDER — LORAZEPAM 2 MG/ML IJ SOLN: 0.5000 mg | INTRAMUSCULAR | Status: DC | PRN

## 2023-11-22 MED ORDER — ONDANSETRON 4 MG PO TBDP: 4.0000 mg | ORAL_TABLET | Freq: Four times a day (QID) | ORAL | Status: DC | PRN

## 2023-11-22 MED ORDER — MORPHINE SULFATE (PF) 2 MG/ML IV SOLN: 1.0000 mg | INTRAVENOUS | Status: DC | PRN

## 2023-11-22 NOTE — Progress Notes (Addendum)
 [facility] [employer], contracting [facility]) Hospital Liaison Note   Family has decided that they want patient to return home.  Patient already has a hospital bed, BSC and WC.  Family requests a hoyer lift, over bed table and home 02.  Referral submitted today.    Please call with any Hospice related questions or concerns  Thank you for the opportunity to participate in this patient's care  [facility] Liaison [phone]

## 2023-11-22 NOTE — Progress Notes (Signed)
 Nutrition Brief Note  Chart reviewed. Case discussed with RN, MD, palliative care and SLP. Per BSE on 11/21/23, recommending NPO. Only options for nutrition would be NGT (temporary) vs PEG tube (long term), however, would not recommended this in a patient with advanced age and dementia. This was discussed with team. Per MD, plan to d/c home tomorrow with hospice. As pt remains NPO, nothing further for RD to recommend at this time.  No further nutrition interventions planned at this time.  Please re-consult as needed.   Herschel Lords, RD, LDN, CDCES Registered Dietitian III Certified Diabetes Care and Education Specialist If unable to reach this RD, please use "RD Inpatient" group chat on secure chat between hours of 8am-4 pm daily

## 2023-11-22 NOTE — Progress Notes (Signed)
 Palliative Care Progress Note, Assessment & Plan   Patient Name: COURTNEY FENLON       Date: 11/22/2023 DOB: 1935/11/20  Age: 88 y.o. MRN#: [id number] Attending Physician: Brenna Cam, MD Primary Care Physician: [facility] Admit Date: 11/20/2023  Subjective: Pt responds "yes" to question of pain but is unable to communicate otherwise.  HPI: 88 y.o. male  with past medical history of COPD, dementia, BPH, HTN and thrombocytosis admitted from home on 11/20/2023 with several day history of weakness, lethargy, shortness of breath and poor PO intake.   Found to be hypoxic, tachycardic and hypotensive by EMS In ED WBC 23,000 and elevated lactic acid   CXR revealing Patchy airspace opacities in the right lung base and minimal patchy airspace opacities in the bilateral lung bases, concerning for pneumonia.   Admitted and being treated for bilateral pneumonia meeting sepsis criteria with acute respiratory failure with hypoxia, AKI due to poor PO intake, remains NPO due to concerns of aspiration   Palliative medicine was consulted for assisting with goals of care conversations.  Summary of counseling/coordination of care: Extensive chart review completed prior to meeting patient including labs, vital signs, imaging, progress notes, orders, and available advanced directive documents from current and previous encounters.   After reviewing the patient's chart and assessing the patient at bedside, I spoke with granddaughter Tarri Farm and great-granddaughter at bedside in regards to symptom management and goals of care.   Ill-appearing, elderly male resting in bed. He is alert, but mostly non-verbal. He responds to verbal stimuli.  He is in no distress.   Discussed family wanting to take patient home with  hospice. Explained that the patient is 100% invested in the [place] and coordinating home with local hospice care to provide care must be approved by [place] for coverage. TOC and ACC liaison are working to see if they are able to obtain approval. Grafton Lawrence, granddaughter, is working with SW at [place] for the same.   Had extensive conversation with family members at the bedside about transition to comfort care, which is the same care he would be receiving at home with hospice. After talking amongst themselves, I received a message from PMT team that Tarri Farm called and wants to transition to comfort care. I returned call to confirm transition to which she agreed.   Therapeutic silence and active listening provided for family to share their thoughts and emotions regarding current medical situation.  Emotional support provided.  Comfort orders placed.  Physical Exam Vitals reviewed.  Constitutional:      General: He is not in acute distress.    Appearance: He is ill-appearing.     Comments: Alert at times but sleepy  HENT:     Mouth/Throat:     Mouth: Mucous membranes are dry.  Pulmonary:     Effort: Pulmonary effort is normal. No respiratory distress.  Skin:    General: Skin is warm and dry.  Neurological:     Mental Status: He is alert.    Recommendations/Plan: Comfort care only Utilize ordered medications for signs/symptoms of pain, dyspnea, anxiety/agitation, N/V or excessive secretions Plan to d/c home with hospice tomorrow (4/15) after delivery of home DME  Total Time 65 minutes   Discussed plan of care with Dr. Mason Sole, primary RN, TOC and hospice liaison.    Time spent includes: Detailed review of medical records (labs, imaging, vital signs), medically appropriate exam (mental status, respiratory, cardiac, skin), discussed with treatment team, counseling and educating patient, family and staff, documenting clinical information, medication management and coordination of care.     Ina Manas, Joyice Nodal- [facility] Palliative Medicine Team  11/22/2023 10:15 AM  Office [phone]  Pager [phone]

## 2023-11-22 NOTE — Progress Notes (Signed)
 Speech Language Pathology Treatment: Dysphagia  Patient Details Name: Jack Rios MRN: [id number] DOB: 1936-01-17 Today's Date: 11/22/2023 Time: 4098-1191 SLP Time Calculation (min) (ACUTE ONLY): 35 min  Assessment / Plan / Recommendation Clinical Impression  Met w/ Family and pt in room today for Education and Support. Pt continues to present w/ declining functioning and medical status overall; he is intermittently alert but eyes closed often, mostly non-verbal. He responds to verbal stimuli at times. Noted wife open-mouth posture at rest; ill-appearing. Family present meeting w/ Palliative Care/Hospice services today re: GOC. Noted MD's note on pt's medical status decline.   Per assessment of pt's alertness and intentional engagement, pt did not appear consistently, and sufficiently, alert to safely engage in po trials. Pt appears to present w/ oropharyngeal phase dysphagia in setting of declined Cognitive status; Baseline Advanced Dementia. Daughter reported that pt's functioning has declined at home. ANY Cognitive decline can impact overall awareness/timing of oral phase and swallowing thus impact safety during po tasks which increases risk for aspiration, choking as well as decreased oral intake overall. Pt presents w/ too HIGH of risk factors currently along w/ lethargy/drowsiness for safe oral intake at this time.      Recommend strict NPO status w/ frequent oral care for hygiene and stimulation and pleasure.   Engaged in Education w/ Family on focusing on oral care for Both hygiene and oral stimulation -- providing drips from the sponge to lightly moisten pt's mouth for his comfort. Highlighted cues and/or stress signals (including gurgly, wet vocal quality) as when to stop/pause during such offering. Encouraged frequent oral care for comfort highlighting need for general aspiration precautions including sitting upright and giving oral stim only when fully awake/alert.  education w/ family  on swallowing/dysphagia; aspiration/aspiration pneumonia; oral care/aspiration precautions; oral stimulation and pleasure using swabs to moisten mouth.   Per MD/note today, pt has transitioned to Comfort Care status and will d/c home tomorrow w/ Hospice services. Family expressed gratitude for the information provided. MD to reconsult if any new needs while admitted.       HPI HPI: Pt is a 88 y.o. male with medical history significant for Advanced Dementia, COPD, Malnutrition, BPH, HTN, thrombocytosis previously on hydroxyurea, full care for ADLs at home being admitted with  pneumonia after presenting with a several day history of weakness and lethargy, shortness of breath, decreased oral intake, in the setting of chronic decline related to advanced dementia.  Patient unable to contribute to history.  O2 sat on arrival of EMS was 77% w/ crackles on R side, and patient was placed on NRB at 15 L.  With EMS he was tachycardic to 106 and BP was 85/56.  CXR: Patchy airspace opacities in the right lung base and minimal patchy  airspace opacities in the bilateral lung bases, concerning for  pneumonia.  MD has initiated Palliative Care/Hospice services d/t pt's decline in status/functioning.      SLP Plan  Discharge SLP treatment due to (comment) - transition to comfort care status      Recommendations for follow up therapy are one component of a multi-disciplinary discharge planning process, led by the attending physician.  Recommendations may be updated based on patient status, additional functional criteria and insurance authorization.    Recommendations  Diet recommendations: NPO (w/ oral care for pleasure, hygiene) Medication Administration: Via alternative means                 (Hospice services at D/C) Oral care QID;Staff/trained caregiver to  provide oral care   Frequent or constant Supervision/Assistance Dysphagia, oropharyngeal phase (R13.12) (Advanced Dementia; illness; advanced  age)     Discharge SLP treatment due to (comment)       Darla Edward, MS, CCC-SLP Speech Language Pathologist Rehab Services; [facility] [phone] (ascom) Jamilette Suchocki  11/22/2023, 3:06 PM

## 2023-11-22 NOTE — Progress Notes (Signed)
 1      PROGRESS NOTE    Jack Rios  WJX:[id number] DOB: 1936-07-09 DOA: 11/20/2023 PCP: [facility]    Brief Narrative:   88 y.o. male with medical history significant for COPD, dementia, BPH, HTN, thrombocytosis previously on hydroxyurea, being admitted with  pneumonia after presenting with a several day history of weakness and lethargy, shortness of breath, decreased oral intake, in the setting of chronic decline related to advanced dementia   4/13: Palliative care consult 4/14: Comfort care/hospice   Assessment & Plan:   Principal Problem:   Bilateral pneumonia Active Problems:   Acute respiratory failure with hypoxia (HCC)   Sepsis (HCC)   Hypernatremia   AKI (acute kidney injury) (HCC)   Inadequate oral intake   Thrombocytosis   Acute metabolic encephalopathy   Dementia without behavioral disturbance (HCC)   Frailty   BPH (benign prostatic hyperplasia)   Sepsis due to pneumonia (HCC)   Goals of care, counseling/discussion   * Bilateral pneumonia Suspect severe sepsis present on admission due to pneumonia Acute respiratory failure with hypoxia AKI (acute kidney injury) (HCC) Hypernatremia Inadequate oral intake  Acute metabolic encephalopathy Thrombocytosis Dementia without behavioral disturbance (HCC) Frailty/physical deconditioning BPH (benign prostatic hyperplasia)    Goals of care Patient and family/daughter are in agreement for comfort care/hospice.  TOC and hospice liaison are working out where he would go from here with hospice   DVT prophylaxis: Comfort care      Code Status: DNR Family Communication: Great granddaughter updated at bedside Disposition Plan: Possible discharge in next 1-2 days depending on hospice coordination at home versus long-term care facility versus IPU.  He may die here    Subjective: Remains weak and lethargic.  Gurgling oral secretions, great grand daughter  at bedside   Objective: Vitals:   11/21/23  1728 11/21/23 2109 11/22/23 0456 11/22/23 0735  BP: 105/62 124/65 133/77 (!) 146/87  Pulse: 83 89 82 80  Resp:  17 19 16   Temp: 97.8 F (36.6 C) 97.7 F (36.5 C) 97.7 F (36.5 C)   TempSrc: Oral     SpO2: 100% 100% 96% 94%  Weight:      Height:        Intake/Output Summary (Last 24 hours) at 11/22/2023 1414 Last data filed at 11/22/2023 0501 Gross per 24 hour  Intake 2420.54 ml  Output 500 ml  Net 1920.54 ml   Filed Weights   11/20/23 1635 11/21/23 0300  Weight: 81.6 kg 69.3 kg    Examination:  General exam: 88 year old frail appearing male, chronically ill looking and likely actively transitioning Respiratory system: Clear to auscultation. Respiratory effort normal. Cardiovascular system: S1 & S2 heard, RRR. No murmurs Gastrointestinal system: Abdomen is soft, benign Central nervous system: Lethargic, no focal neurological deficits. Extremities: Symmetric 5 x 5 power.     Data Reviewed: I have personally reviewed following labs and imaging studies  CBC: Recent Labs  Lab 11/20/23 1629 11/21/23 0504  WBC 23.5* 17.3*  NEUTROABS 20.7*  --   HGB 10.1* 9.1*  HCT 32.8* 29.2*  MCV 111.2* 111.0*  PLT 1,551* 1,217*   Basic Metabolic Panel: Recent Labs  Lab 11/20/23 1629 11/21/23 0504  NA 155* 153*  K 4.2 3.2*  CL 121* 121*  CO2 21* 23  GLUCOSE 133* 128*  BUN 39* 31*  CREATININE 1.69* 1.24  CALCIUM 9.4 9.0   GFR: Estimated Creatinine Clearance: 42.7 mL/min (by C-G formula based on SCr of 1.24 mg/dL). Liver Function Tests: Recent  Labs  Lab 11/20/23 1629  AST 28  ALT 17  ALKPHOS 67  BILITOT 1.4*  PROT 7.4  ALBUMIN 3.6   No results for input(s): "LIPASE", "AMYLASE" in the last 168 hours. No results for input(s): "AMMONIA" in the last 168 hours. Coagulation Profile: Recent Labs  Lab 11/20/23 1629 11/21/23 0504  INR 1.5* 1.5*    Sepsis Labs: Recent Labs  Lab 11/20/23 1629 11/20/23 2041  LATICACIDVEN 3.2* 1.9    Recent Results (from the  past 240 hours)  Blood Culture (routine x 2)     Status: None (Preliminary result)   Collection Time: 11/20/23  4:29 PM   Specimen: BLOOD  Result Value Ref Range Status   Specimen Description BLOOD RIGHT ANTECUBITAL  Final   Special Requests   Final    BOTTLES DRAWN AEROBIC AND ANAEROBIC Blood Culture results may not be optimal due to an inadequate volume of blood received in culture bottles   Culture   Final    NO GROWTH 2 DAYS Performed at [facility], [address] [place], [place] [address]    Report Status PENDING  Incomplete  Resp panel by RT-PCR (RSV, Flu A&B, Covid) Anterior Nasal Swab     Status: None   Collection Time: 11/20/23  5:30 PM   Specimen: Anterior Nasal Swab  Result Value Ref Range Status   SARS Coronavirus 2 by RT PCR NEGATIVE NEGATIVE Final    Comment: (NOTE) SARS-CoV-2 target nucleic acids are NOT DETECTED.  The SARS-CoV-2 RNA is generally detectable in upper respiratory specimens during the acute phase of infection. The lowest concentration of SARS-CoV-2 viral copies this assay can detect is 138 copies/mL. A negative result does not preclude SARS-Cov-2 infection and should not be used as the sole basis for treatment or other patient management decisions. A negative result may occur with  improper specimen collection/handling, submission of specimen other than nasopharyngeal swab, presence of viral mutation(s) within the areas targeted by this assay, and inadequate number of viral copies(<138 copies/mL). A negative result must be combined with clinical observations, patient history, and epidemiological information. The expected result is Negative.  Fact Sheet for Patients:  BloggerCourse.com  Fact Sheet for Healthcare Providers:  SeriousBroker.it  This test is no t yet approved or cleared by the [place]  FDA and  has been authorized for detection and/or diagnosis of SARS-CoV-2 by FDA  under an Emergency Use Authorization (EUA). This EUA will remain  in effect (meaning this test can be used) for the duration of the COVID-19 declaration under Section 564(b)(1) of the Act, 21 U.S.C.section 360bbb-3(b)(1), unless the authorization is terminated  or revoked sooner.       Influenza A by PCR NEGATIVE NEGATIVE Final   Influenza B by PCR NEGATIVE NEGATIVE Final    Comment: (NOTE) The Xpert Xpress SARS-CoV-2/FLU/RSV plus assay is intended as an aid in the diagnosis of influenza from Nasopharyngeal swab specimens and should not be used as a sole basis for treatment. Nasal washings and aspirates are unacceptable for Xpert Xpress SARS-CoV-2/FLU/RSV testing.  Fact Sheet for Patients: BloggerCourse.com  Fact Sheet for Healthcare Providers: SeriousBroker.it  This test is not yet approved or cleared by the [place]  FDA and has been authorized for detection and/or diagnosis of SARS-CoV-2 by FDA under an Emergency Use Authorization (EUA). This EUA will remain in effect (meaning this test can be used) for the duration of the COVID-19 declaration under Section 564(b)(1) of the Act, 21 U.S.C. section 360bbb-3(b)(1), unless the authorization is  terminated or revoked.     Resp Syncytial Virus by PCR NEGATIVE NEGATIVE Final    Comment: (NOTE) Fact Sheet for Patients: BloggerCourse.com  Fact Sheet for Healthcare Providers: SeriousBroker.it  This test is not yet approved or cleared by the [place]  FDA and has been authorized for detection and/or diagnosis of SARS-CoV-2 by FDA under an Emergency Use Authorization (EUA). This EUA will remain in effect (meaning this test can be used) for the duration of the COVID-19 declaration under Section 564(b)(1) of the Act, 21 U.S.C. section 360bbb-3(b)(1), unless the authorization is terminated or revoked.  Performed at [facility], [address] [place], [place] [address]   Blood Culture (routine x 2)     Status: None (Preliminary result)   Collection Time: 11/20/23  8:41 PM   Specimen: BLOOD  Result Value Ref Range Status   Specimen Description BLOOD BLOOD LEFT ARM  Final   Special Requests   Final    BOTTLES DRAWN AEROBIC AND ANAEROBIC Blood Culture results may not be optimal due to an excessive volume of blood received in culture bottles   Culture  Setup Time ANAEROBIC BOTTLE ONLY NO ORGANISMS SEEN   Final   Culture   Final    NO GROWTH 2 DAYS Performed at [facility], [address] [place], [place] [address]    Report Status PENDING  Incomplete         Radiology Studies: DG Chest Port 1 View Result Date: 11/20/2023 CLINICAL DATA:  Questionable sepsis EXAM: PORTABLE CHEST 1 VIEW COMPARISON:  Chest x-ray 01/11/2023 FINDINGS: There are patchy airspace opacities in the right lung base. There are minimal patchy airspace opacities in the bilateral lung bases. There is no pleural effusion or pneumothorax. The cardiomediastinal silhouette is within normal limits. No acute fractures are seen. There are degenerative changes of the left shoulder. IMPRESSION: Patchy airspace opacities in the right lung base and minimal patchy airspace opacities in the bilateral lung bases, concerning for pneumonia. Electronically Signed   By: Tyron Gallon M.D.   On: 11/20/2023 17:13      LOS: 1 day    Time spent: 35 minutes    Divon Krabill Mason Sole, MD [facility] Pager [phone]  If 7PM-7AM, please contact night-coverage [email or web address]  11/22/2023, 2:14 PM

## 2023-11-22 NOTE — Progress Notes (Signed)
 [facility] [employer], contracting [facility]) Hospital Liaison Note   Received request from [facility], Rozelle Corning, RN, to meet with patient at bedside to explain hospice services at home.  HL  met patient/family at bedside and provided extensive education for hospice services. Patient's wife has concerns about patient discharging home due to his care needs.  Family asked that HL inform them about Hospice services at a LTC facility and at the [facility].    Patent/family requested additional time to consider options before proceeding. All questions answered and no concerns voiced   [facility] information and contact numbers given to family & above information shared with TOC.   Please call with any questions/concerns.    Thank you for the opportunity to participate in this patient's care.  [facility] Liaison [phone]

## 2023-11-23 ENCOUNTER — Other Ambulatory Visit: Payer: Self-pay

## 2023-11-23 DIAGNOSIS — Z515 Encounter for palliative care: Secondary | ICD-10-CM

## 2023-11-23 DIAGNOSIS — F0390 Unspecified dementia without behavioral disturbance: Secondary | ICD-10-CM | POA: Diagnosis not present

## 2023-11-23 DIAGNOSIS — J189 Pneumonia, unspecified organism: Secondary | ICD-10-CM | POA: Diagnosis not present

## 2023-11-23 MED ORDER — LORAZEPAM 0.5 MG PO TABS
0.5000 mg | ORAL_TABLET | ORAL | 0 refills | Status: DC | PRN
  Filled 2023-11-23: qty 42, 12d supply, fill #0

## 2023-11-23 MED ORDER — HALOPERIDOL 5 MG PO TABS
5.0000 mg | ORAL_TABLET | ORAL | 0 refills | Status: DC | PRN
  Filled 2023-11-23: qty 42, 7d supply, fill #0

## 2023-11-23 MED ORDER — HYOSCYAMINE SULFATE 0.125 MG SL SUBL
0.1250 mg | SUBLINGUAL_TABLET | SUBLINGUAL | 0 refills | Status: AC | PRN
Start: 2023-11-23 — End: ?
  Filled 2023-11-23: qty 42, 7d supply, fill #0

## 2023-11-23 MED ORDER — MORPHINE SULFATE (CONCENTRATE) 20 MG/ML PO SOLN
5.0000 mg | ORAL | 0 refills | Status: AC | PRN
  Filled 2023-11-23: qty 30, 20d supply, fill #0

## 2023-11-23 MED ORDER — SENNOSIDES 8.6 MG PO TABS
2.0000 | ORAL_TABLET | Freq: Two times a day (BID) | ORAL | 0 refills | Status: DC
  Filled 2023-11-23: qty 28, 7d supply, fill #0

## 2023-11-23 NOTE — Progress Notes (Signed)
   11/23/23 1045  Spiritual Encounters  Type of Visit Initial  Care provided to: Patient  Conversation partners present during encounter Nurse  Reason for visit Routine spiritual support  OnCall Visit No   Chaplain visited this patient at the suggestion of the staff.  Chaplain introduced [employer] and offered the patient any support that was needed.  Chaplain was not needed at this time but let the patient know he can ask his Nurse to page the Chaplain if spiritual care is needed.    Rev. Rana M. Nolon Baxter, M.Div.  Chaplain Resident [facility]

## 2023-11-23 NOTE — Plan of Care (Signed)
  Problem: Fluid Volume: Goal: Hemodynamic stability will improve Outcome: Progressing   Problem: Clinical Measurements: Goal: Diagnostic test results will improve Outcome: Progressing Goal: Signs and symptoms of infection will decrease Outcome: Progressing   Problem: Respiratory: Goal: Ability to maintain adequate ventilation will improve Outcome: Progressing   Problem: Education: Goal: Knowledge of General Education information will improve Description: Including pain rating scale, medication(s)/side effects and non-pharmacologic comfort measures Outcome: Progressing   Problem: Health Behavior/Discharge Planning: Goal: Ability to manage health-related needs will improve Outcome: Progressing   Problem: Clinical Measurements: Goal: Ability to maintain clinical measurements within normal limits will improve Outcome: Progressing Goal: Will remain free from infection Outcome: Progressing Goal: Diagnostic test results will improve Outcome: Progressing Goal: Respiratory complications will improve Outcome: Progressing Goal: Cardiovascular complication will be avoided Outcome: Progressing   Problem: Coping: Goal: Level of anxiety will decrease Outcome: Progressing   Problem: Elimination: Goal: Will not experience complications related to bowel motility Outcome: Progressing Goal: Will not experience complications related to urinary retention Outcome: Progressing   Problem: Pain Managment: Goal: General experience of comfort will improve and/or be controlled Outcome: Progressing   Problem: Safety: Goal: Ability to remain free from injury will improve Outcome: Progressing   Problem: Skin Integrity: Goal: Risk for impaired skin integrity will decrease Outcome: Progressing   Problem: Education: Goal: Knowledge of the prescribed therapeutic regimen will improve Outcome: Progressing   Problem: Coping: Goal: Ability to identify and develop effective coping behavior will  improve Outcome: Progressing   Problem: Clinical Measurements: Goal: Quality of life will improve Outcome: Progressing   Problem: Respiratory: Goal: Verbalizations of increased ease of respirations will increase Outcome: Progressing   Problem: Role Relationship: Goal: Family's ability to cope with current situation will improve Outcome: Progressing Goal: Ability to verbalize concerns, feelings, and thoughts to partner or family member will improve Outcome: Progressing   Problem: Pain Management: Goal: Satisfaction with pain management regimen will improve Outcome: Progressing

## 2023-11-23 NOTE — TOC Transition Note (Signed)
 Transition of Care [facility]) - Discharge Note   Patient Details  Name: Jack Rios MRN: [id number] Date of Birth: [birth year]  [facility]) CM/SW Contact:  Elsie Halo, RN Phone Number: 11/23/2023, 11:08 AM   Clinical Narrative:     Patient is medically clear to dc to home with home health hospice via [facility]. [facility] will transport the patient. No other TOC needs identified.  Final next level of care: Home w Hospice Care Barriers to Discharge: Continued Medical Work up   Patient Goals and CMS Choice            Discharge Placement                       Discharge Plan and Services Additional resources added to the After Visit Summary for                                       Social Drivers of Health (SDOH) Interventions SDOH Screenings   Food Insecurity: No Food Insecurity (11/20/2023)  Housing: Patient Unable To Answer (11/21/2023)  Transportation Needs: No Transportation Needs (11/20/2023)  Utilities: Not At Risk (11/20/2023)  Social Connections: Moderately Isolated (11/20/2023)  Tobacco Use: Medium Risk (11/20/2023)     Readmission Risk Interventions     No data to display

## 2023-11-23 NOTE — Progress Notes (Signed)
 Explained that patient moaning and being restless can be a sign of pain anxiety discomfort. Martie Slaughter, son declined any medications for discomfort, restlessness, anxiety.

## 2023-11-23 NOTE — Discharge Summary (Signed)
 Physician Discharge Summary   Patient: Jack Rios MRN: [id number] DOB: Feb 01, 1936  Admit date:     11/20/2023  Discharge date: 11/23/23  Discharge Physician: Brenna Cam   PCP: [facility]   Recommendations at discharge:   Hospice at home  Discharge Diagnoses: Principal Problem:   Bilateral pneumonia Active Problems:   Acute respiratory failure with hypoxia (HCC)   Sepsis (HCC)   Hypernatremia   AKI (acute kidney injury) (HCC)   Inadequate oral intake   Thrombocytosis   Acute metabolic encephalopathy   Dementia without behavioral disturbance (HCC)   Frailty   BPH (benign prostatic hyperplasia)   Sepsis due to pneumonia [facility])   Hospice care  Hospital Course: Assessment and Plan:  88 y.o. male with medical history significant for COPD, dementia, BPH, HTN, thrombocytosis previously on hydroxyurea, being admitted with  pneumonia after presenting with a several day history of weakness and lethargy, shortness of breath, decreased oral intake, in the setting of chronic decline related to advanced dementia    4/13: Palliative care consult 4/14: Comfort care/hospice     Assessment & Plan:   Principal Problem:   Bilateral pneumonia Active Problems:   Acute respiratory failure with hypoxia (HCC)   Sepsis (HCC)   Hypernatremia   AKI (acute kidney injury) (HCC)   Inadequate oral intake   Thrombocytosis   Acute metabolic encephalopathy   Dementia without behavioral disturbance (HCC)   Frailty   BPH (benign prostatic hyperplasia)   Sepsis due to pneumonia (HCC)   Goals of care, counseling/discussion     Bilateral pneumonia Suspect severe sepsis present on admission due to pneumonia Acute respiratory failure with hypoxia AKI (acute kidney injury) (HCC) Hypernatremia Inadequate oral intake  Acute metabolic encephalopathy Thrombocytosis Dementia without behavioral disturbance (HCC) Frailty/physical deconditioning BPH (benign prostatic hyperplasia)      Goals of care Patient and family/daughter are in agreement for comfort care/hospice.  Patient is going home with hospice today          Disposition: Hospice care Diet recommendation:  Discharge Diet Orders (From admission, onward)     Start     Ordered   11/23/23 0000  Diet - low sodium heart healthy        11/23/23 0953           Carb modified diet DISCHARGE MEDICATION: Allergies as of 11/23/2023       Reactions   Molds & Smuts         Medication List     STOP taking these medications    acetaminophen 500 MG tablet Commonly known as: TYLENOL   albuterol 108 (90 Base) MCG/ACT inhaler Commonly known as: VENTOLIN HFA   Asmanex (60 Metered Doses) 220 MCG/INH inhaler Generic drug: mometasone   aspirin 81 MG chewable tablet   atorvastatin 40 MG tablet Commonly known as: LIPITOR   cetirizine 5 MG tablet Commonly known as: ZYRTEC   chlorpheniramine 4 MG tablet Commonly known as: CHLOR-TRIMETON   diclofenac Sodium 1 % Gel Commonly known as: VOLTAREN   docusate sodium 100 MG capsule Commonly known as: COLACE   donepezil 10 MG tablet Commonly known as: ARICEPT   eucerin cream   ferrous sulfate 325 (65 FE) MG tablet   finasteride 5 MG tablet Commonly known as: PROSCAR   fluticasone 50 MCG/ACT nasal spray Commonly known as: FLONASE   gabapentin 300 MG capsule Commonly known as: NEURONTIN   hydroxypropyl methylcellulose / hypromellose 2.5 % ophthalmic solution Commonly known as: ISOPTO TEARS /  GONIOVISC   hydroxyurea 500 MG capsule Commonly known as: HYDREA   ibuprofen 200 MG tablet Commonly known as: ADVIL   ipratropium 0.06 % nasal spray Commonly known as: ATROVENT   ketoconazole 2 % cream Commonly known as: NIZORAL   loratadine 10 MG tablet Commonly known as: CLARITIN   losartan 25 MG tablet Commonly known as: COZAAR   melatonin 3 MG Tabs tablet   pantoprazole 40 MG tablet Commonly known as: PROTONIX   sertraline 25 MG  tablet Commonly known as: ZOLOFT   tamsulosin 0.4 MG Caps capsule Commonly known as: FLOMAX   tiotropium 18 MCG inhalation capsule Commonly known as: SPIRIVA   traMADol 50 MG tablet Commonly known as: ULTRAM   TYLENOL ALLERGY COMPLETE PO   UNABLE TO FIND   Vitamin D (Ergocalciferol) 1.25 MG (50000 UNIT) Caps capsule Commonly known as: DRISDOL   zinc oxide 20 % ointment       TAKE these medications    haloperidol 5 MG tablet Commonly known as: HALDOL Take 1 tablet (5 mg total) by mouth every 4 (four) hours as needed for agitation. May crush, mix with water and give sublingually if needed.   LORazepam 0.5 MG tablet Commonly known as: ATIVAN Take 1 tablet (0.5 mg total) by mouth every 4 (four) hours as needed for anxiety. May crush, mix with water and give sublingually if needed.   morphine CONCENTRATE 10 mg / 0.5 ml concentrated solution Take 0.25 mLs (5 mg total) by mouth every 4 (four) hours as needed for up to 3 days for severe pain (pain score 7-10). May give sublingually if needed.   Oscimin 0.125 MG SL tablet Generic drug: hyoscyamine Place 1 tablet (0.125 mg total) under the tongue every 4 (four) hours as needed (excess oral secretions).   senna 8.6 MG Tabs tablet Commonly known as: Senokot Take 2 tablets (17.2 mg total) by mouth 2 (two) times daily. May crush, mix with water and give sublingually if needed.        Follow-up Information     [facility] Follow up.   Specialty: General Practice Why: hospital follow up Contact information: [address] [place] [address] [phone]         [facility]. Schedule an appointment as soon as possible for a visit in 1 week(s).   Specialty: General Practice Why: [facility] Discharge F/UP Contact information: [address] [place] [address] [phone]                Discharge Exam: Ceasar Mons Weights   11/20/23 1635 11/21/23 0300  Weight: 81.6 kg 69.3 kg    General exam: 88 year old frail appearing male, chronically ill looking and likely actively transitioning Respiratory system: Clear to auscultation. Respiratory effort normal. Cardiovascular system: S1 & S2 heard, RRR. No murmurs Gastrointestinal system: Abdomen is soft, benign Central nervous system: Lethargic, no focal neurological deficits. Extremities: Symmetric 5 x 5 power.  Condition at discharge: poor  The results of significant diagnostics from this hospitalization (including imaging, microbiology, ancillary and laboratory) are listed below for reference.   Imaging Studies: DG Chest Port 1 View Result Date: 11/20/2023 CLINICAL DATA:  Questionable sepsis EXAM: PORTABLE CHEST 1 VIEW COMPARISON:  Chest x-ray 01/11/2023 FINDINGS: There are patchy airspace opacities in the right lung base. There are minimal patchy airspace opacities in the bilateral lung bases. There is no pleural effusion or pneumothorax. The cardiomediastinal silhouette is within normal limits. No acute fractures are seen. There are degenerative changes of  the left shoulder. IMPRESSION: Patchy airspace opacities in the right lung base and minimal patchy airspace opacities in the bilateral lung bases, concerning for pneumonia. Electronically Signed   By: Darliss Cheney M.D.   On: 11/20/2023 17:13    Microbiology: Results for orders placed or performed during the hospital encounter of 11/20/23  Blood Culture (routine x 2)     Status: None (Preliminary result)   Collection Time: 11/20/23  4:29 PM   Specimen: BLOOD  Result Value Ref Range Status   Specimen Description BLOOD RIGHT ANTECUBITAL  Final   Special Requests   Final    BOTTLES DRAWN AEROBIC AND ANAEROBIC Blood Culture results may not be optimal due to an inadequate volume of blood received in culture bottles   Culture   Final    NO GROWTH 3 DAYS Performed at [facility], [address] [place], [place] [address]    Report Status PENDING   Incomplete  Resp panel by RT-PCR (RSV, Flu A&B, Covid) Anterior Nasal Swab     Status: None   Collection Time: 11/20/23  5:30 PM   Specimen: Anterior Nasal Swab  Result Value Ref Range Status   SARS Coronavirus 2 by RT PCR NEGATIVE NEGATIVE Final    Comment: (NOTE) SARS-CoV-2 target nucleic acids are NOT DETECTED.  The SARS-CoV-2 RNA is generally detectable in upper respiratory specimens during the acute phase of infection. The lowest concentration of SARS-CoV-2 viral copies this assay can detect is 138 copies/mL. A negative result does not preclude SARS-Cov-2 infection and should not be used as the sole basis for treatment or other patient management decisions. A negative result may occur with  improper specimen collection/handling, submission of specimen other than nasopharyngeal swab, presence of viral mutation(s) within the areas targeted by this assay, and inadequate number of viral copies(<138 copies/mL). A negative result must be combined with clinical observations, patient history, and epidemiological information. The expected result is Negative.  Fact Sheet for Patients:  BloggerCourse.com  Fact Sheet for Healthcare Providers:  SeriousBroker.it  This test is no t yet approved or cleared by the [place] FDA and  has been authorized for detection and/or diagnosis of SARS-CoV-2 by FDA under an Emergency Use Authorization (EUA). This EUA will remain  in effect (meaning this test can be used) for the duration of the COVID-19 declaration under Section 564(b)(1) of the Act, 21 U.S.C.section 360bbb-3(b)(1), unless the authorization is terminated  or revoked sooner.       Influenza A by PCR NEGATIVE NEGATIVE Final   Influenza B by PCR NEGATIVE NEGATIVE Final    Comment: (NOTE) The Xpert Xpress SARS-CoV-2/FLU/RSV plus assay is intended as an aid in the diagnosis of influenza from Nasopharyngeal swab specimens and should  not be used as a sole basis for treatment. Nasal washings and aspirates are unacceptable for Xpert Xpress SARS-CoV-2/FLU/RSV testing.  Fact Sheet for Patients: BloggerCourse.com  Fact Sheet for Healthcare Providers: SeriousBroker.it  This test is not yet approved or cleared by the [place] FDA and has been authorized for detection and/or diagnosis of SARS-CoV-2 by FDA under an Emergency Use Authorization (EUA). This EUA will remain in effect (meaning this test can be used) for the duration of the COVID-19 declaration under Section 564(b)(1) of the Act, 21 U.S.C. section 360bbb-3(b)(1), unless the authorization is terminated or revoked.     Resp Syncytial Virus by PCR NEGATIVE NEGATIVE Final    Comment: (NOTE) Fact Sheet for Patients: BloggerCourse.com  Fact Sheet for Healthcare Providers: SeriousBroker.it  This test is not yet approved or cleared by the [place]  FDA and has been authorized for detection and/or diagnosis of SARS-CoV-2 by FDA under an Emergency Use Authorization (EUA). This EUA will remain in effect (meaning this test can be used) for the duration of the COVID-19 declaration under Section 564(b)(1) of the Act, 21 U.S.C. section 360bbb-3(b)(1), unless the authorization is terminated or revoked.  Performed at [facility], [address] [place], [place] [address]   Blood Culture (routine x 2)     Status: None (Preliminary result)   Collection Time: 11/20/23  8:41 PM   Specimen: BLOOD  Result Value Ref Range Status   Specimen Description BLOOD BLOOD LEFT ARM  Final   Special Requests   Final    BOTTLES DRAWN AEROBIC AND ANAEROBIC Blood Culture results may not be optimal due to an excessive volume of blood received in culture bottles   Culture  Setup Time ANAEROBIC BOTTLE ONLY NO ORGANISMS SEEN   Final   Culture   Final    NO GROWTH 3  DAYS Performed at [facility], [address] [place], [place] [address]    Report Status PENDING  Incomplete    Labs: CBC: Recent Labs  Lab 11/20/23 1629 11/21/23 0504  WBC 23.5* 17.3*  NEUTROABS 20.7*  --   HGB 10.1* 9.1*  HCT 32.8* 29.2*  MCV 111.2* 111.0*  PLT 1,551* 1,217*   Basic Metabolic Panel: Recent Labs  Lab 11/20/23 1629 11/21/23 0504  NA 155* 153*  K 4.2 3.2*  CL 121* 121*  CO2 21* 23  GLUCOSE 133* 128*  BUN 39* 31*  CREATININE 1.69* 1.24  CALCIUM 9.4 9.0   Liver Function Tests: Recent Labs  Lab 11/20/23 1629  AST 28  ALT 17  ALKPHOS 67  BILITOT 1.4*  PROT 7.4  ALBUMIN 3.6   CBG: No results for input(s): "GLUCAP" in the last 168 hours.  Discharge time spent: greater than 30 minutes.  Signed: Brenna Cam, MD [facility] 11/23/2023

## 2023-11-25 LAB — CULTURE, BLOOD (ROUTINE X 2)
Culture: NO GROWTH
Culture: NO GROWTH

## 2023-12-09 DEATH — deceased

## 2024-02-29 ENCOUNTER — Other Ambulatory Visit (HOSPITAL_COMMUNITY): Payer: Self-pay

## 2024-03-03 ENCOUNTER — Other Ambulatory Visit (HOSPITAL_COMMUNITY): Payer: Self-pay

## 2024-09-13 ENCOUNTER — Other Ambulatory Visit: Payer: Self-pay
